# Patient Record
Sex: Male | Born: 1945 | Race: White | Hispanic: No | Marital: Married | Smoking: Never smoker
Health system: Southern US, Community
[De-identification: ages and names within clinical notes are randomized; demographics above are authoritative.]

## PROBLEM LIST (undated history)

## (undated) DIAGNOSIS — Z923 Personal history of irradiation: Secondary | ICD-10-CM

## (undated) DIAGNOSIS — C8307 Small cell B-cell lymphoma, spleen: Secondary | ICD-10-CM

## (undated) DIAGNOSIS — L570 Actinic keratosis: Secondary | ICD-10-CM

## (undated) DIAGNOSIS — C61 Malignant neoplasm of prostate: Secondary | ICD-10-CM

## (undated) DIAGNOSIS — I10 Essential (primary) hypertension: Secondary | ICD-10-CM

## (undated) DIAGNOSIS — D696 Thrombocytopenia, unspecified: Secondary | ICD-10-CM

## (undated) HISTORY — DX: Actinic keratosis: L57.0

## (undated) HISTORY — DX: Small cell b-cell lymphoma, spleen: C83.07

---

## 2000-12-25 ENCOUNTER — Other Ambulatory Visit: Admission: RE | Admit: 2000-12-25 | Discharge: 2000-12-25 | Payer: Self-pay | Admitting: Urology

## 2001-01-05 ENCOUNTER — Encounter: Admission: RE | Admit: 2001-01-05 | Discharge: 2001-01-05 | Payer: Self-pay | Admitting: Urology

## 2001-01-05 ENCOUNTER — Encounter: Payer: Self-pay | Admitting: Urology

## 2001-01-14 ENCOUNTER — Ambulatory Visit: Admission: RE | Admit: 2001-01-14 | Discharge: 2001-04-14 | Payer: Self-pay | Admitting: Radiation Oncology

## 2001-02-24 DIAGNOSIS — C61 Malignant neoplasm of prostate: Secondary | ICD-10-CM

## 2001-02-24 HISTORY — DX: Malignant neoplasm of prostate: C61

## 2001-03-02 ENCOUNTER — Inpatient Hospital Stay (HOSPITAL_COMMUNITY): Admission: RE | Admit: 2001-03-02 | Discharge: 2001-03-05 | Payer: Self-pay | Admitting: Urology

## 2001-03-02 HISTORY — PX: RETROPUBIC PROSTATECTOMY: SUR1055

## 2001-03-17 ENCOUNTER — Emergency Department (HOSPITAL_COMMUNITY): Admission: EM | Admit: 2001-03-17 | Discharge: 2001-03-17 | Payer: Self-pay | Admitting: Emergency Medicine

## 2001-03-31 ENCOUNTER — Ambulatory Visit (HOSPITAL_COMMUNITY): Admission: RE | Admit: 2001-03-31 | Discharge: 2001-03-31 | Payer: Self-pay | Admitting: *Deleted

## 2001-04-01 ENCOUNTER — Ambulatory Visit (HOSPITAL_COMMUNITY): Admission: RE | Admit: 2001-04-01 | Discharge: 2001-04-01 | Payer: Self-pay | Admitting: *Deleted

## 2001-04-01 ENCOUNTER — Encounter: Payer: Self-pay | Admitting: *Deleted

## 2003-05-24 ENCOUNTER — Encounter: Admission: RE | Admit: 2003-05-24 | Discharge: 2003-05-24 | Payer: Self-pay | Admitting: Family Medicine

## 2003-07-12 ENCOUNTER — Encounter: Admission: RE | Admit: 2003-07-12 | Discharge: 2003-07-12 | Payer: Self-pay | Admitting: Neurosurgery

## 2003-07-27 ENCOUNTER — Encounter: Admission: RE | Admit: 2003-07-27 | Discharge: 2003-07-27 | Payer: Self-pay | Admitting: Neurosurgery

## 2003-12-25 ENCOUNTER — Encounter: Admission: RE | Admit: 2003-12-25 | Discharge: 2003-12-25 | Payer: Self-pay | Admitting: Family Medicine

## 2010-03-13 ENCOUNTER — Other Ambulatory Visit: Payer: Self-pay | Admitting: Gastroenterology

## 2010-11-12 ENCOUNTER — Ambulatory Visit: Payer: Self-pay | Admitting: Oncology

## 2010-11-25 ENCOUNTER — Ambulatory Visit: Payer: Self-pay | Admitting: Oncology

## 2010-12-26 ENCOUNTER — Ambulatory Visit: Payer: Self-pay | Admitting: Oncology

## 2011-02-26 ENCOUNTER — Ambulatory Visit: Payer: Self-pay | Admitting: Oncology

## 2011-02-26 LAB — HEPATIC FUNCTION PANEL A (ARMC)
Albumin: 4.2 g/dL (ref 3.4–5.0)
Alkaline Phosphatase: 99 U/L (ref 50–136)
Bilirubin, Direct: 0.1 mg/dL (ref 0.00–0.20)
Bilirubin,Total: 0.6 mg/dL (ref 0.2–1.0)
Total Protein: 6.3 g/dL — ABNORMAL LOW (ref 6.4–8.2)

## 2011-02-26 LAB — CBC CANCER CENTER
Basophil %: 0.5 %
Eosinophil #: 0 x10 3/mm (ref 0.0–0.7)
HCT: 38.6 % — ABNORMAL LOW (ref 40.0–52.0)
HGB: 13.2 g/dL (ref 13.0–18.0)
MCH: 26.6 pg (ref 26.0–34.0)
MCHC: 34.1 g/dL (ref 32.0–36.0)
Monocyte #: 0.3 x10 3/mm (ref 0.0–0.7)
Neutrophil #: 2.4 x10 3/mm (ref 1.4–6.5)
Neutrophil %: 72.9 %

## 2011-03-28 ENCOUNTER — Ambulatory Visit: Payer: Self-pay | Admitting: Oncology

## 2011-04-07 DIAGNOSIS — N529 Male erectile dysfunction, unspecified: Secondary | ICD-10-CM | POA: Insufficient documentation

## 2011-04-07 DIAGNOSIS — C61 Malignant neoplasm of prostate: Secondary | ICD-10-CM | POA: Insufficient documentation

## 2011-09-03 ENCOUNTER — Ambulatory Visit: Payer: Self-pay | Admitting: Oncology

## 2011-09-03 LAB — COMPREHENSIVE METABOLIC PANEL
Albumin: 4.2 g/dL (ref 3.4–5.0)
Anion Gap: 5 — ABNORMAL LOW (ref 7–16)
Calcium, Total: 8.8 mg/dL (ref 8.5–10.1)
EGFR (African American): >60
Glucose: 112 mg/dL — ABNORMAL HIGH (ref 65–99)
Potassium: 4.3 mmol/L (ref 3.5–5.1)
SGOT(AST): 22 U/L (ref 15–37)

## 2011-09-03 LAB — CBC CANCER CENTER
Basophil %: 0.6 %
Eosinophil #: 0 x10 3/mm (ref 0.0–0.7)
HGB: 12.9 g/dL — ABNORMAL LOW (ref 13.0–18.0)
Lymphocyte %: 16.5 %
MCHC: 32.2 g/dL (ref 32.0–36.0)
Neutrophil %: 74.6 %
Platelet: 48 x10 3/mm — ABNORMAL LOW (ref 150–440)
RBC: 5.19 10*6/uL (ref 4.40–5.90)

## 2011-09-25 ENCOUNTER — Ambulatory Visit: Payer: Self-pay | Admitting: Oncology

## 2011-09-29 DIAGNOSIS — R972 Elevated prostate specific antigen [PSA]: Secondary | ICD-10-CM | POA: Insufficient documentation

## 2011-11-24 ENCOUNTER — Ambulatory Visit: Payer: Self-pay | Admitting: Oncology

## 2011-11-26 ENCOUNTER — Ambulatory Visit: Payer: Self-pay | Admitting: Oncology

## 2011-11-26 LAB — CBC CANCER CENTER
Basophil #: 0 x10 3/mm (ref 0.0–0.1)
Eosinophil #: 0 x10 3/mm (ref 0.0–0.7)
HGB: 12.4 g/dL — ABNORMAL LOW (ref 13.0–18.0)
Lymphocyte %: 16.3 %
MCH: 25 pg — ABNORMAL LOW (ref 26.0–34.0)
MCHC: 32.2 g/dL (ref 32.0–36.0)
Monocyte #: 0.3 x10 3/mm (ref 0.2–1.0)
RDW: 15.6 % — ABNORMAL HIGH (ref 11.5–14.5)

## 2011-12-26 ENCOUNTER — Ambulatory Visit: Payer: Self-pay | Admitting: Oncology

## 2012-02-23 ENCOUNTER — Ambulatory Visit: Payer: Self-pay | Admitting: Oncology

## 2012-03-01 ENCOUNTER — Ambulatory Visit: Payer: Self-pay | Admitting: Oncology

## 2012-03-04 ENCOUNTER — Ambulatory Visit: Payer: Self-pay | Admitting: Oncology

## 2012-03-04 LAB — COMPREHENSIVE METABOLIC PANEL
Alkaline Phosphatase: 128 U/L (ref 50–136)
Anion Gap: 8 (ref 7–16)
Bilirubin,Total: 0.5 mg/dL (ref 0.2–1.0)
Calcium, Total: 8.6 mg/dL (ref 8.5–10.1)
Chloride: 109 mmol/L — ABNORMAL HIGH (ref 98–107)
Creatinine: 1.28 mg/dL (ref 0.60–1.30)
EGFR (African American): >60
EGFR (Non-African Amer.): 58 — ABNORMAL LOW
Glucose: 107 mg/dL — ABNORMAL HIGH (ref 65–99)
Potassium: 4.4 mmol/L (ref 3.5–5.1)
SGOT(AST): 22 U/L (ref 15–37)
SGPT (ALT): 37 U/L (ref 12–78)
Total Protein: 6.1 g/dL — ABNORMAL LOW (ref 6.4–8.2)

## 2012-03-04 LAB — CBC CANCER CENTER
Basophil #: 0 x10 3/mm (ref 0.0–0.1)
Basophil %: 0.7 %
Eosinophil %: 1.3 %
HCT: 36.1 % — ABNORMAL LOW (ref 40.0–52.0)
HGB: 12.2 g/dL — ABNORMAL LOW (ref 13.0–18.0)
Lymphocyte #: 0.6 x10 3/mm — ABNORMAL LOW (ref 1.0–3.6)
Lymphocyte %: 17.4 %
MCHC: 33.8 g/dL (ref 32.0–36.0)
MCV: 76 fL — ABNORMAL LOW (ref 80–100)
Monocyte #: 0.3 x10 3/mm (ref 0.2–1.0)
Platelet: 44 x10 3/mm — ABNORMAL LOW (ref 150–440)
RBC: 4.76 10*6/uL (ref 4.40–5.90)
RDW: 15.5 % — ABNORMAL HIGH (ref 11.5–14.5)

## 2012-03-27 ENCOUNTER — Ambulatory Visit: Payer: Self-pay | Admitting: Oncology

## 2012-04-24 HISTORY — PX: SPLENECTOMY: SUR1306

## 2012-05-28 ENCOUNTER — Ambulatory Visit: Payer: Self-pay | Admitting: Oncology

## 2012-05-28 LAB — CBC CANCER CENTER
Basophil #: 0.1 x10 3/mm (ref 0.0–0.1)
Basophil %: 0.7 %
Eosinophil #: 0.3 x10 3/mm (ref 0.0–0.7)
Eosinophil %: 1.5 %
HCT: 33.8 % — ABNORMAL LOW (ref 40.0–52.0)
MCH: 25.4 pg — ABNORMAL LOW (ref 26.0–34.0)
MCHC: 31.4 g/dL — ABNORMAL LOW (ref 32.0–36.0)
MCV: 81 fL (ref 80–100)
Platelet: 1598 x10 3/mm — ABNORMAL HIGH (ref 150–440)
RBC: 4.18 10*6/uL — ABNORMAL LOW (ref 4.40–5.90)
RDW: 17.2 % — ABNORMAL HIGH (ref 11.5–14.5)
WBC: 20.2 x10 3/mm — ABNORMAL HIGH (ref 3.8–10.6)

## 2012-06-01 LAB — CBC CANCER CENTER
Basophil %: 0.3 %
Eosinophil #: 0.3 x10 3/mm (ref 0.0–0.7)
Lymphocyte #: 2.3 x10 3/mm (ref 1.0–3.6)
Lymphocyte %: 17.4 %
MCHC: 32.2 g/dL (ref 32.0–36.0)
MCV: 82 fL (ref 80–100)
Monocyte %: 11.9 %
Neutrophil #: 9.2 x10 3/mm — ABNORMAL HIGH (ref 1.4–6.5)

## 2012-06-08 LAB — CBC CANCER CENTER
HCT: 36.7 % — ABNORMAL LOW (ref 40.0–52.0)
MCHC: 31.8 g/dL — ABNORMAL LOW (ref 32.0–36.0)
Monocytes: 15 %
WBC: 18.9 x10 3/mm — ABNORMAL HIGH (ref 3.8–10.6)

## 2012-06-15 LAB — CBC CANCER CENTER
Monocytes: 11 %
Platelet: 364 x10 3/mm (ref 150–440)
Segmented Neutrophils: 58 %
WBC: 21.5 x10 3/mm — ABNORMAL HIGH (ref 3.8–10.6)

## 2012-06-15 LAB — COMPREHENSIVE METABOLIC PANEL
Alkaline Phosphatase: 190 U/L — ABNORMAL HIGH (ref 50–136)
BUN: 10 mg/dL (ref 7–18)
Calcium, Total: 9.6 mg/dL (ref 8.5–10.1)
EGFR (African American): >60
Glucose: 115 mg/dL — ABNORMAL HIGH (ref 65–99)
Osmolality: 279 (ref 275–301)
SGOT(AST): 21 U/L (ref 15–37)
SGPT (ALT): 41 U/L (ref 12–78)
Sodium: 140 mmol/L (ref 136–145)
Total Protein: 6.8 g/dL (ref 6.4–8.2)

## 2012-06-23 LAB — CBC CANCER CENTER
HGB: 10.6 g/dL — ABNORMAL LOW (ref 13.0–18.0)
MCHC: 30.8 g/dL — ABNORMAL LOW (ref 32.0–36.0)
MCV: 83 fL (ref 80–100)
Segmented Neutrophils: 72 %
WBC: 29.4 x10 3/mm — ABNORMAL HIGH (ref 3.8–10.6)

## 2012-06-24 ENCOUNTER — Ambulatory Visit: Payer: Self-pay | Admitting: Oncology

## 2012-06-28 LAB — CBC CANCER CENTER
Eosinophil: 1 %
HCT: 38.2 % — ABNORMAL LOW (ref 40.0–52.0)
HGB: 11.8 g/dL — ABNORMAL LOW (ref 13.0–18.0)
Lymphocytes: 28 %
MCH: 25.9 pg — ABNORMAL LOW (ref 26.0–34.0)
MCV: 84 fL (ref 80–100)
Monocytes: 13 %

## 2012-06-28 LAB — URINALYSIS, COMPLETE
Blood: NEGATIVE
Glucose,UR: NEGATIVE mg/dL (ref 0–75)
Nitrite: NEGATIVE
RBC,UR: <1 /HPF (ref 0–5)
Specific Gravity: 1.015 (ref 1.003–1.030)

## 2012-06-29 LAB — URINE CULTURE

## 2012-07-07 LAB — CBC CANCER CENTER
Bands: 1 %
Basophil: 1 %
Lymphocytes: 38 %
Monocytes: 13 %
RDW: 21 % — ABNORMAL HIGH (ref 11.5–14.5)
Segmented Neutrophils: 47 %
WBC: 17.4 x10 3/mm — ABNORMAL HIGH (ref 3.8–10.6)

## 2012-07-14 LAB — CBC CANCER CENTER
Basophil %: 1.7 %
Eosinophil %: 2.2 %
HCT: 37.1 % — ABNORMAL LOW (ref 40.0–52.0)
Lymphocyte #: 6.6 x10 3/mm — ABNORMAL HIGH (ref 1.0–3.6)
Lymphocyte %: 43.5 %
MCV: 85 fL (ref 80–100)
Monocyte %: 11 %
Neutrophil #: 6.3 x10 3/mm (ref 1.4–6.5)
Neutrophil %: 41.6 %
Platelet: 134 x10 3/mm — ABNORMAL LOW (ref 150–440)

## 2012-07-25 ENCOUNTER — Ambulatory Visit: Payer: Self-pay | Admitting: Oncology

## 2012-07-28 LAB — COMPREHENSIVE METABOLIC PANEL
Alkaline Phosphatase: 149 U/L — ABNORMAL HIGH (ref 50–136)
BUN: 17 mg/dL (ref 7–18)
Bilirubin,Total: 0.2 mg/dL (ref 0.2–1.0)
Co2: 29 mmol/L (ref 21–32)
EGFR (African American): >60
EGFR (Non-African Amer.): >60
Osmolality: 289 (ref 275–301)
Potassium: 4.7 mmol/L (ref 3.5–5.1)
SGOT(AST): 29 U/L (ref 15–37)
SGPT (ALT): 41 U/L (ref 12–78)
Total Protein: 6.7 g/dL (ref 6.4–8.2)

## 2012-07-28 LAB — CBC CANCER CENTER
Basophil #: 0.2 x10 3/mm — ABNORMAL HIGH (ref 0.0–0.1)
Basophil %: 1.1 %
Eosinophil #: 0.2 x10 3/mm (ref 0.0–0.7)
HCT: 39.2 % — ABNORMAL LOW (ref 40.0–52.0)
HGB: 12.5 g/dL — ABNORMAL LOW (ref 13.0–18.0)
Lymphocyte %: 36.4 %
MCHC: 31.9 g/dL — ABNORMAL LOW (ref 32.0–36.0)
Monocyte #: 1.4 x10 3/mm — ABNORMAL HIGH (ref 0.2–1.0)
Monocyte %: 9.8 %
Platelet: 486 x10 3/mm — ABNORMAL HIGH (ref 150–440)
RBC: 4.62 10*6/uL (ref 4.40–5.90)
WBC: 14.3 x10 3/mm — ABNORMAL HIGH (ref 3.8–10.6)

## 2012-08-24 ENCOUNTER — Ambulatory Visit: Payer: Self-pay | Admitting: Oncology

## 2012-08-25 LAB — CBC CANCER CENTER
Eosinophil %: 2.2 %
HCT: 40 % (ref 40.0–52.0)
Lymphocyte #: 5.2 x10 3/mm — ABNORMAL HIGH (ref 1.0–3.6)
Lymphocyte %: 49.6 %
MCH: 26.7 pg (ref 26.0–34.0)
Monocyte #: 1.2 x10 3/mm — ABNORMAL HIGH (ref 0.2–1.0)
Neutrophil %: 32.3 %
RBC: 4.9 10*6/uL (ref 4.40–5.90)

## 2012-09-07 LAB — CBC CANCER CENTER
Basophil #: 0.2 x10 3/mm — ABNORMAL HIGH (ref 0.0–0.1)
Basophil %: 1.4 %
Eosinophil #: 0.2 x10 3/mm (ref 0.0–0.7)
Eosinophil %: 1.6 %
HCT: 42.2 % (ref 40.0–52.0)
HGB: 13.5 g/dL (ref 13.0–18.0)
Lymphocyte %: 47.6 %
MCH: 26.2 pg (ref 26.0–34.0)
Monocyte %: 10.9 %
RDW: 15.6 % — ABNORMAL HIGH (ref 11.5–14.5)

## 2012-09-24 ENCOUNTER — Ambulatory Visit: Payer: Self-pay | Admitting: Oncology

## 2012-09-29 LAB — CBC CANCER CENTER
Basophil #: 0.2 x10 3/mm — ABNORMAL HIGH (ref 0.0–0.1)
Basophil %: 1.5 %
Eosinophil %: 1.2 %
Lymphocyte %: 40.6 %
Monocyte %: 9.8 %
Neutrophil #: 5.6 x10 3/mm (ref 1.4–6.5)
Neutrophil %: 46.9 %
Platelet: 313 x10 3/mm (ref 150–440)
RDW: 16.5 % — ABNORMAL HIGH (ref 11.5–14.5)

## 2012-10-25 ENCOUNTER — Ambulatory Visit: Payer: Self-pay | Admitting: Oncology

## 2012-10-27 LAB — CBC CANCER CENTER
Basophil #: 0.2 x10 3/mm — ABNORMAL HIGH (ref 0.0–0.1)
Eosinophil #: 0.2 x10 3/mm (ref 0.0–0.7)
Eosinophil %: 1.3 %
Lymphocyte %: 38.1 %
MCH: 26.3 pg (ref 26.0–34.0)
MCHC: 32.2 g/dL (ref 32.0–36.0)
MCV: 82 fL (ref 80–100)
Monocyte %: 8.1 %
Neutrophil #: 7.3 x10 3/mm — ABNORMAL HIGH (ref 1.4–6.5)
RBC: 5.61 10*6/uL (ref 4.40–5.90)
WBC: 14.3 x10 3/mm — ABNORMAL HIGH (ref 3.8–10.6)

## 2012-10-27 LAB — COMPREHENSIVE METABOLIC PANEL
Albumin: 4.1 g/dL (ref 3.4–5.0)
BUN: 15 mg/dL (ref 7–18)
Bilirubin,Total: 0.6 mg/dL (ref 0.2–1.0)
Calcium, Total: 8.9 mg/dL (ref 8.5–10.1)
Co2: 28 mmol/L (ref 21–32)
Glucose: 111 mg/dL — ABNORMAL HIGH (ref 65–99)
Potassium: 4.4 mmol/L (ref 3.5–5.1)
SGOT(AST): 25 U/L (ref 15–37)
SGPT (ALT): 36 U/L (ref 12–78)
Total Protein: 6.7 g/dL (ref 6.4–8.2)

## 2012-11-24 ENCOUNTER — Ambulatory Visit: Payer: Self-pay | Admitting: Oncology

## 2012-11-24 LAB — COMPREHENSIVE METABOLIC PANEL
Albumin: 4.1 g/dL (ref 3.4–5.0)
Alkaline Phosphatase: 191 U/L — ABNORMAL HIGH (ref 50–136)
Anion Gap: 10 (ref 7–16)
Bilirubin,Total: 0.4 mg/dL (ref 0.2–1.0)
Calcium, Total: 8.9 mg/dL (ref 8.5–10.1)
Chloride: 105 mmol/L (ref 98–107)
EGFR (African American): >60
Osmolality: 285 (ref 275–301)
Potassium: 4.5 mmol/L (ref 3.5–5.1)
SGPT (ALT): 54 U/L (ref 12–78)
Total Protein: 6.9 g/dL (ref 6.4–8.2)

## 2012-11-24 LAB — CBC CANCER CENTER
Basophil %: 1.2 %
Eosinophil %: 1.5 %
HCT: 48.1 % (ref 40.0–52.0)
Lymphocyte %: 27.6 %
MCHC: 32.3 g/dL (ref 32.0–36.0)
Neutrophil %: 58.3 %
RBC: 5.87 10*6/uL (ref 4.40–5.90)
WBC: 13.7 x10 3/mm — ABNORMAL HIGH (ref 3.8–10.6)

## 2012-12-22 LAB — CBC CANCER CENTER
Basophil #: 0.1 x10 3/mm (ref 0.0–0.1)
Eosinophil #: 0.2 x10 3/mm (ref 0.0–0.7)
Eosinophil %: 1.4 %
Lymphocyte #: 3.6 x10 3/mm (ref 1.0–3.6)
MCHC: 32.3 g/dL (ref 32.0–36.0)
Monocyte %: 11.9 %
Neutrophil #: 7.9 x10 3/mm — ABNORMAL HIGH (ref 1.4–6.5)
Platelet: 319 x10 3/mm (ref 150–440)
RBC: 5.65 10*6/uL (ref 4.40–5.90)
RDW: 18.5 % — ABNORMAL HIGH (ref 11.5–14.5)
WBC: 13.5 x10 3/mm — ABNORMAL HIGH (ref 3.8–10.6)

## 2012-12-25 ENCOUNTER — Ambulatory Visit: Payer: Self-pay | Admitting: Oncology

## 2013-03-16 ENCOUNTER — Ambulatory Visit: Payer: Self-pay | Admitting: Oncology

## 2013-03-16 LAB — CBC CANCER CENTER
Basophil #: 0.2 x10 3/mm — ABNORMAL HIGH (ref 0.0–0.1)
Basophil %: 1.1 %
EOS PCT: 1.9 %
Eosinophil #: 0.3 x10 3/mm (ref 0.0–0.7)
HCT: 50.6 % (ref 40.0–52.0)
HGB: 16.3 g/dL (ref 13.0–18.0)
LYMPHS PCT: 31.8 %
Lymphocyte #: 4.3 x10 3/mm — ABNORMAL HIGH (ref 1.0–3.6)
MCH: 28.2 pg (ref 26.0–34.0)
MCHC: 32.2 g/dL (ref 32.0–36.0)
MCV: 87 fL (ref 80–100)
Monocyte #: 1.4 x10 3/mm — ABNORMAL HIGH (ref 0.2–1.0)
Monocyte %: 10.4 %
Neutrophil #: 7.5 x10 3/mm — ABNORMAL HIGH (ref 1.4–6.5)
Neutrophil %: 54.8 %
Platelet: 333 x10 3/mm (ref 150–440)
RBC: 5.79 10*6/uL (ref 4.40–5.90)
RDW: 15.3 % — ABNORMAL HIGH (ref 11.5–14.5)
WBC: 13.6 x10 3/mm — AB (ref 3.8–10.6)

## 2013-03-16 LAB — COMPREHENSIVE METABOLIC PANEL
ALK PHOS: 123 U/L — AB
AST: 26 U/L (ref 15–37)
Albumin: 4.2 g/dL (ref 3.4–5.0)
Anion Gap: 8 (ref 7–16)
BILIRUBIN TOTAL: 0.5 mg/dL (ref 0.2–1.0)
BUN: 18 mg/dL (ref 7–18)
CALCIUM: 8.6 mg/dL (ref 8.5–10.1)
CO2: 28 mmol/L (ref 21–32)
Chloride: 105 mmol/L (ref 98–107)
Creatinine: 1.21 mg/dL (ref 0.60–1.30)
EGFR (African American): >60
EGFR (Non-African Amer.): >60
GLUCOSE: 119 mg/dL — AB (ref 65–99)
Osmolality: 284 (ref 275–301)
Potassium: 4.5 mmol/L (ref 3.5–5.1)
SGPT (ALT): 38 U/L (ref 12–78)
Sodium: 141 mmol/L (ref 136–145)
Total Protein: 6.9 g/dL (ref 6.4–8.2)

## 2013-03-27 ENCOUNTER — Ambulatory Visit: Payer: Self-pay | Admitting: Oncology

## 2013-04-13 LAB — CBC CANCER CENTER
BASOS ABS: 0.2 x10 3/mm — AB (ref 0.0–0.1)
BASOS PCT: 1.2 %
EOS PCT: 1.3 %
Eosinophil #: 0.2 x10 3/mm (ref 0.0–0.7)
HCT: 50 % (ref 40.0–52.0)
HGB: 16.9 g/dL (ref 13.0–18.0)
Lymphocyte #: 4.8 x10 3/mm — ABNORMAL HIGH (ref 1.0–3.6)
Lymphocyte %: 36.4 %
MCH: 30 pg (ref 26.0–34.0)
MCHC: 33.8 g/dL (ref 32.0–36.0)
MCV: 89 fL (ref 80–100)
MONOS PCT: 9.3 %
Monocyte #: 1.2 x10 3/mm — ABNORMAL HIGH (ref 0.2–1.0)
NEUTROS ABS: 6.8 x10 3/mm — AB (ref 1.4–6.5)
Neutrophil %: 51.8 %
PLATELETS: 331 x10 3/mm (ref 150–440)
RBC: 5.64 10*6/uL (ref 4.40–5.90)
RDW: 15.4 % — ABNORMAL HIGH (ref 11.5–14.5)
WBC: 13.2 x10 3/mm — AB (ref 3.8–10.6)

## 2013-04-24 ENCOUNTER — Ambulatory Visit: Payer: Self-pay | Admitting: Oncology

## 2013-05-11 LAB — COMPREHENSIVE METABOLIC PANEL
AST: 24 U/L (ref 15–37)
Albumin: 4.1 g/dL (ref 3.4–5.0)
Alkaline Phosphatase: 114 U/L
Anion Gap: 8 (ref 7–16)
BUN: 28 mg/dL — ABNORMAL HIGH (ref 7–18)
Bilirubin,Total: 0.3 mg/dL (ref 0.2–1.0)
Calcium, Total: 9.1 mg/dL (ref 8.5–10.1)
Chloride: 107 mmol/L (ref 98–107)
Co2: 29 mmol/L (ref 21–32)
Creatinine: 1.13 mg/dL (ref 0.60–1.30)
EGFR (African American): >60
EGFR (Non-African Amer.): >60
Glucose: 107 mg/dL — ABNORMAL HIGH (ref 65–99)
OSMOLALITY: 293 (ref 275–301)
Potassium: 4.3 mmol/L (ref 3.5–5.1)
SGPT (ALT): 41 U/L (ref 12–78)
Sodium: 144 mmol/L (ref 136–145)
TOTAL PROTEIN: 6.6 g/dL (ref 6.4–8.2)

## 2013-05-11 LAB — CBC CANCER CENTER
Basophil #: 0.2 x10 3/mm — ABNORMAL HIGH (ref 0.0–0.1)
Basophil %: 1.1 %
EOS ABS: 0.2 x10 3/mm (ref 0.0–0.7)
EOS PCT: 1.5 %
HCT: 50.4 % (ref 40.0–52.0)
HGB: 16.4 g/dL (ref 13.0–18.0)
LYMPHS PCT: 37.2 %
Lymphocyte #: 5.4 x10 3/mm — ABNORMAL HIGH (ref 1.0–3.6)
MCH: 29 pg (ref 26.0–34.0)
MCHC: 32.4 g/dL (ref 32.0–36.0)
MCV: 89 fL (ref 80–100)
Monocyte #: 1.3 x10 3/mm — ABNORMAL HIGH (ref 0.2–1.0)
Monocyte %: 8.7 %
Neutrophil #: 7.5 x10 3/mm — ABNORMAL HIGH (ref 1.4–6.5)
Neutrophil %: 51.5 %
PLATELETS: 295 x10 3/mm (ref 150–440)
RBC: 5.65 10*6/uL (ref 4.40–5.90)
RDW: 15.3 % — ABNORMAL HIGH (ref 11.5–14.5)
WBC: 14.5 x10 3/mm — ABNORMAL HIGH (ref 3.8–10.6)

## 2013-05-25 ENCOUNTER — Ambulatory Visit: Payer: Self-pay | Admitting: Oncology

## 2013-07-11 ENCOUNTER — Ambulatory Visit: Payer: Self-pay | Admitting: Oncology

## 2013-07-13 ENCOUNTER — Ambulatory Visit: Payer: Self-pay | Admitting: Oncology

## 2013-07-13 LAB — COMPREHENSIVE METABOLIC PANEL
ALBUMIN: 4.4 g/dL (ref 3.4–5.0)
ALK PHOS: 115 U/L
ANION GAP: 8 (ref 7–16)
AST: 31 U/L (ref 15–37)
BILIRUBIN TOTAL: 0.6 mg/dL (ref 0.2–1.0)
BUN: 22 mg/dL — ABNORMAL HIGH (ref 7–18)
CALCIUM: 9 mg/dL (ref 8.5–10.1)
CHLORIDE: 105 mmol/L (ref 98–107)
CO2: 29 mmol/L (ref 21–32)
CREATININE: 1.07 mg/dL (ref 0.60–1.30)
EGFR (African American): >60
Glucose: 111 mg/dL — ABNORMAL HIGH (ref 65–99)
Osmolality: 287 (ref 275–301)
POTASSIUM: 4.6 mmol/L (ref 3.5–5.1)
SGPT (ALT): 52 U/L (ref 12–78)
SODIUM: 142 mmol/L (ref 136–145)
Total Protein: 7.2 g/dL (ref 6.4–8.2)

## 2013-07-13 LAB — CBC CANCER CENTER
Basophil #: 0.1 x10 3/mm (ref 0.0–0.1)
Basophil %: 0.9 %
EOS ABS: 0.2 x10 3/mm (ref 0.0–0.7)
EOS PCT: 1.3 %
HCT: 49.3 % (ref 40.0–52.0)
HGB: 16.8 g/dL (ref 13.0–18.0)
Lymphocyte #: 4.9 x10 3/mm — ABNORMAL HIGH (ref 1.0–3.6)
Lymphocyte %: 35.5 %
MCH: 30.2 pg (ref 26.0–34.0)
MCHC: 34.1 g/dL (ref 32.0–36.0)
MCV: 89 fL (ref 80–100)
Monocyte #: 1.3 x10 3/mm — ABNORMAL HIGH (ref 0.2–1.0)
Monocyte %: 9.7 %
NEUTROS PCT: 52.6 %
Neutrophil #: 7.3 x10 3/mm — ABNORMAL HIGH (ref 1.4–6.5)
PLATELETS: 287 x10 3/mm (ref 150–440)
RBC: 5.57 10*6/uL (ref 4.40–5.90)
RDW: 14.6 % — ABNORMAL HIGH (ref 11.5–14.5)
WBC: 13.9 x10 3/mm — AB (ref 3.8–10.6)

## 2013-07-25 ENCOUNTER — Ambulatory Visit: Payer: Self-pay | Admitting: Oncology

## 2013-10-18 ENCOUNTER — Encounter: Payer: Self-pay | Admitting: Radiation Oncology

## 2013-10-18 NOTE — Progress Notes (Signed)
GU Location of Tumor / Histology: prostate adenocarcinoma, s/p radical prostatectomy w/positive margins 2003  If Prostate Cancer, Gleason Score is (3 + 4) and PSA is (0.37 on 04/04/13)  PSA,Diagnostic Assay - Final result (10/03/2013 8:04 PM EDT)  Component Value   PROSTATE SPECIFIC AG 0.54    Haskel Khan presented  1 month ago with signs/symptoms of: slowly increasing PSA, s/p prostatectomy in 2003  Biopsies of  (if applicable) revealed: adenocarcinoma Gleason 3+4=7 03/02/2001   Past/Anticipated interventions by urology, if any: PSA surveillance  Past/Anticipated interventions by medical oncology, if any: none  Weight changes, if any: no  Bowel/Bladder complaints, if any:  IPSS  9, noct x 2, urgency, frequency depending on what fluids he drinks  Nausea/Vomiting, if any: no  Pain issues, if any:  no  SAFETY ISSUES:  Prior radiation? no  Pacemaker/ICD? no  Possible current pregnancy? na  Is the patient on methotrexate? no  Current Complaints / other details:  Married, 2 daughters, 3 grandchildren, 1 on the way, retired from [employer], works part time in [employer] at [employer]

## 2013-10-19 ENCOUNTER — Encounter: Payer: Self-pay | Admitting: Radiation Oncology

## 2013-10-20 ENCOUNTER — Encounter: Payer: Self-pay | Admitting: Radiation Oncology

## 2013-10-20 ENCOUNTER — Ambulatory Visit
Admission: RE | Admit: 2013-10-20 | Discharge: 2013-10-20 | Disposition: A | Discharge status: Home / Self Care | Payer: PRIVATE HEALTH INSURANCE | Source: Ambulatory Visit | Attending: Radiation Oncology | Admitting: Radiation Oncology

## 2013-10-20 VITALS — BP 147/89 | HR 79 | Temp 98.0°F | Resp 20 | Ht 69.0 in | Wt 220.0 lb

## 2013-10-20 DIAGNOSIS — Z510 Encounter for antineoplastic radiation therapy: Secondary | ICD-10-CM | POA: Diagnosis not present

## 2013-10-20 DIAGNOSIS — C61 Malignant neoplasm of prostate: Secondary | ICD-10-CM | POA: Diagnosis not present

## 2013-10-20 DIAGNOSIS — Z8042 Family history of malignant neoplasm of prostate: Secondary | ICD-10-CM | POA: Insufficient documentation

## 2013-10-20 DIAGNOSIS — Z7982 Long term (current) use of aspirin: Secondary | ICD-10-CM | POA: Insufficient documentation

## 2013-10-20 DIAGNOSIS — Z9079 Acquired absence of other genital organ(s): Secondary | ICD-10-CM | POA: Diagnosis not present

## 2013-10-20 DIAGNOSIS — Z9089 Acquired absence of other organs: Secondary | ICD-10-CM | POA: Insufficient documentation

## 2013-10-20 DIAGNOSIS — Z8546 Personal history of malignant neoplasm of prostate: Secondary | ICD-10-CM | POA: Insufficient documentation

## 2013-10-20 DIAGNOSIS — N529 Male erectile dysfunction, unspecified: Secondary | ICD-10-CM | POA: Insufficient documentation

## 2013-10-20 HISTORY — DX: Malignant neoplasm of prostate: C61

## 2013-10-20 HISTORY — DX: Thrombocytopenia, unspecified: D69.6

## 2013-10-20 NOTE — Progress Notes (Signed)
Per Dr Valere Dross, spoke w/[facility] secretary, [phone]. Pt resides in rm [address], nurse is April Roberts, Therapist, sports. Fax for [facility] [phone]. Dr Valere Dross

## 2013-10-20 NOTE — Progress Notes (Signed)
[facility] NEW PATIENT EVALUATION  Name: Chad Sandoval MRN: [id number]  Date:   10/20/2013           DOB: June 03, 1945  Status: outpatient   CC: No primary provider on file.  Chad Broker, MD    REFERRING PHYSICIAN: Myrlene Broker, MD   DIAGNOSIS: PSA recurrent carcinoma the prostate   HISTORY OF PRESENT ILLNESS:  Chad Sandoval is a 68 y.o. male who is seen today through the courtesy of Dr. 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  He presented with localized adenocarcinoma prostate and underwent a radical retropubic prostatectomy on 03/02/2001. He had Gleason 7 (3+4) disease with focal margin involvement. There appeared to be disease along the right posterior lateral prostate with a with transection of the capsule. 20% of the prostate tissue contained carcinoma and disease involved both lobes. Seminal vesicles were free of disease. 4 lymph nodes on the left and 2 lymph nodes on the right were free of metastatic disease. He did well postoperatively and his PSA was 0.2 in August 2011, 0.23 in February 2012, 0.31 in August 2012, 0.37 in November 2012, 0.32 in February 2013. More recent PSAs were 0.37 in August 2013, 0.35 in February 2014 , 0.4 in August 2014, 0.35 in October 2014, and most recently 0.54. He is doing well from a GU and GI standpoint. He does not have any significant urinary incontinence. He does have erectile dysfunction which is improved with Viagra.  PREVIOUS RADIATION THERAPY: No   PAST MEDICAL HISTORY:  has a past medical history of Prostate cancer (2003) and Thrombocytopenia.     PAST SURGICAL HISTORY:  Past Surgical History  Procedure Laterality Date  . Retropubic prostatectomy  03/02/2001    Gleason 7  . 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 Father ha history of prostate cancer.   SOCIAL HISTORY:  reports that he has never smoked. He does not have any smokeless tobacco history on file. He reports that he does not drink alcohol or use illicit drugs. Married, 2 children. 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 He currently works part time at [employer] and is an avid Air cabin crew.   ALLERGIES: Oxycontin   MEDICATIONS:  Current Outpatient Prescriptions  Medication Sig Dispense Refill  . aspirin 81 MG chewable tablet Chew 81 mg by mouth.      . sildenafil (VIAGRA) 100 MG tablet Take 100 mg by mouth.       No current facility-administered medications for this encounter.     REVIEW OF SYSTEMS:  Pertinent items are noted in HPI.    PHYSICAL EXAM:  height is 5\' 9"  (1.753 m) and weight is 220 lb (99.791 kg). His oral temperature is 98 F (36.7 C). His blood pressure is 147/89 and his pulse is 79. His respiration is 20.   Alert and oriented 68 year old white male appearing younger than his stated age appearing younger than his stated age.   LABORATORY DATA:  No results found for this basename: WBC, HGB, HCT, MCV, PLT   No results found for this basename: NA, K, CL, CO2   No results found for this basename: ALT, AST, GGT, ALKPHOS, BILITOT   PSA 0.54   IMPRESSION: Pathologic stageT3a/PSA recurrent carcinoma the prostate.  I explained to the patient and his wife that he has either local disease alone, distant disease alone, or both local and distant disease. Clinical predictors for local recurrence alone include a positive margin/pT3a disease, disease-free interval, slowly rising PSA (doubling time) and initial PSA of less than 10 and Gleason score less than or equal to 7. He has all of the predictors for a local recurrence alone. The most important predictor is a positive margin/extracapsular extension. He is an excellent candidate for salvage radiation therapy. We feel that the best "window of  opportunity" for salvage is between 0.2 -0.5. We discussed the potential acute and late toxicities of radiation therapy. From a technical standpoint, he will be treated with external beam/IMRT (Tomotherapy). We discussed treatment with a comfortably full bladder to minimize urinary toxicity. Consent is signed today. He will return for simulation/treatment planning next week.   PLAN: As discussed above.  I spent 40 minutes minutes face to face with the patient and more than 50% of that time was spent in counseling and/or coordination of care.

## 2013-10-20 NOTE — Progress Notes (Signed)
Please see the Nurse Progress Note in the MD Initial Consult Encounter for this patient. 

## 2013-10-24 ENCOUNTER — Ambulatory Visit
Admission: RE | Admit: 2013-10-24 | Discharge: 2013-10-24 | Disposition: A | Discharge status: Home / Self Care | Payer: PRIVATE HEALTH INSURANCE | Source: Ambulatory Visit | Attending: Radiation Oncology | Admitting: Radiation Oncology

## 2013-10-24 DIAGNOSIS — C61 Malignant neoplasm of prostate: Secondary | ICD-10-CM

## 2013-10-24 DIAGNOSIS — Z510 Encounter for antineoplastic radiation therapy: Secondary | ICD-10-CM | POA: Diagnosis not present

## 2013-10-24 NOTE — Progress Notes (Signed)
Complex simulation/treatment planning note:  The patient was taken to the CT simulator and placed supine. A Vac lock immobilization device was constructed. A red rubber tube was placed within the rectal vault. He was then catheterized for a urethrogram. He was then scanned. The CT data set was sent to the planning system (MIM) I contoured his high-risk prostate bed (CTV 66) and expanded this by 0.5 cm to create PTV 66 which will receive 6600 cGy in 33 sessions. Also contoured his rectum, bladder, and lower rectosigmoid colon. He is now ready for 3-D simulation.

## 2013-10-26 ENCOUNTER — Encounter: Payer: Self-pay | Admitting: Radiation Oncology

## 2013-10-26 DIAGNOSIS — Z510 Encounter for antineoplastic radiation therapy: Secondary | ICD-10-CM | POA: Diagnosis not present

## 2013-10-26 NOTE — Progress Notes (Addendum)
IMRT simulation/treatment planning note: The patient underwent IMRT simulation/treatment planning for treatment to his prostate bed. IMRT was chosen to decrease the risk for both acute and late bladder and rectal toxicity compared to conventional or 3-D conformal radiation therapy. Dose fine histograms were obtained for the target volume in addition to his bladder, rectum, and femoral heads. We met our departmental guidelines. He is set up to dual ARC VMAT IMRT. I prescribing 6600 cGy in 33 sessions utilizing 6 MV photons.

## 2013-11-01 DIAGNOSIS — Z510 Encounter for antineoplastic radiation therapy: Secondary | ICD-10-CM | POA: Diagnosis not present

## 2013-11-03 ENCOUNTER — Ambulatory Visit
Admission: RE | Admit: 2013-11-03 | Discharge: 2013-11-03 | Disposition: A | Discharge status: Home / Self Care | Payer: PRIVATE HEALTH INSURANCE | Source: Ambulatory Visit | Attending: Radiation Oncology | Admitting: Radiation Oncology

## 2013-11-03 ENCOUNTER — Ambulatory Visit: Payer: PRIVATE HEALTH INSURANCE

## 2013-11-03 DIAGNOSIS — C61 Malignant neoplasm of prostate: Secondary | ICD-10-CM

## 2013-11-03 DIAGNOSIS — Z510 Encounter for antineoplastic radiation therapy: Secondary | ICD-10-CM | POA: Diagnosis not present

## 2013-11-04 ENCOUNTER — Ambulatory Visit
Admission: RE | Admit: 2013-11-04 | Discharge: 2013-11-04 | Disposition: A | Discharge status: Home / Self Care | Payer: PRIVATE HEALTH INSURANCE | Source: Ambulatory Visit | Attending: Radiation Oncology | Admitting: Radiation Oncology

## 2013-11-04 ENCOUNTER — Ambulatory Visit: Payer: PRIVATE HEALTH INSURANCE

## 2013-11-04 DIAGNOSIS — Z510 Encounter for antineoplastic radiation therapy: Secondary | ICD-10-CM | POA: Diagnosis not present

## 2013-11-04 DIAGNOSIS — C61 Malignant neoplasm of prostate: Secondary | ICD-10-CM

## 2013-11-04 NOTE — Progress Notes (Signed)
Chart note: On 11/03/2013 the patient began his dual ARC VMAT IMRT in the management of his course of the prostate. He is being treated with dynamic MLCs corresponding to one set of IMRT treatment devices [phone]).

## 2013-11-04 NOTE — Progress Notes (Signed)
Patient education completed with pt. Gave pt "Radiation and You" booklet w/all pertinent information marked and discussed, re: rectal soreness/care, fatigue, urinary/bladder irritation/management, nutrition, pain. Pt verbalized understanding.

## 2013-11-07 ENCOUNTER — Ambulatory Visit
Admission: RE | Admit: 2013-11-07 | Discharge: 2013-11-07 | Disposition: A | Discharge status: Home / Self Care | Payer: PRIVATE HEALTH INSURANCE | Source: Ambulatory Visit | Attending: Radiation Oncology | Admitting: Radiation Oncology

## 2013-11-07 ENCOUNTER — Ambulatory Visit: Payer: PRIVATE HEALTH INSURANCE

## 2013-11-07 VITALS — BP 159/87 | HR 81 | Temp 98.1°F | Wt 219.8 lb

## 2013-11-07 DIAGNOSIS — C61 Malignant neoplasm of prostate: Secondary | ICD-10-CM

## 2013-11-07 DIAGNOSIS — Z510 Encounter for antineoplastic radiation therapy: Secondary | ICD-10-CM | POA: Diagnosis not present

## 2013-11-07 NOTE — Progress Notes (Signed)
Weekly Management Note:  Site: Prostate bed Current Dose:  600  cGy Projected Dose: 6600  cGy  Narrative: The patient is seen today for routine under treatment assessment. CBCT/MVCT images/port films were reviewed. The chart was reviewed.   Bladder filling is satisfactory but could be better. No GU or GI difficulties.  Physical Examination:  Filed Vitals:   11/07/13 1628  BP: 159/87  Pulse: 81  Temp: 98.1 F (36.7 C)  .  Weight: 219 lb 12.8 oz (99.701 kg). No change.  Impression: Tolerating radiation therapy well. I encouraged him to improve his bladder filling.  Plan: Continue radiation therapy as planned.

## 2013-11-07 NOTE — Progress Notes (Signed)
Weekly assessment of radiation to prostate fossa.Completed 3 treatments.No urinary of bowel problems voiced.

## 2013-11-08 ENCOUNTER — Ambulatory Visit: Payer: PRIVATE HEALTH INSURANCE

## 2013-11-08 ENCOUNTER — Ambulatory Visit
Admission: RE | Admit: 2013-11-08 | Discharge: 2013-11-08 | Disposition: A | Discharge status: Home / Self Care | Payer: PRIVATE HEALTH INSURANCE | Source: Ambulatory Visit | Attending: Radiation Oncology | Admitting: Radiation Oncology

## 2013-11-08 DIAGNOSIS — Z510 Encounter for antineoplastic radiation therapy: Secondary | ICD-10-CM | POA: Diagnosis not present

## 2013-11-09 ENCOUNTER — Ambulatory Visit: Payer: PRIVATE HEALTH INSURANCE

## 2013-11-09 ENCOUNTER — Ambulatory Visit
Admission: RE | Admit: 2013-11-09 | Discharge: 2013-11-09 | Disposition: A | Discharge status: Home / Self Care | Payer: PRIVATE HEALTH INSURANCE | Source: Ambulatory Visit | Attending: Radiation Oncology | Admitting: Radiation Oncology

## 2013-11-09 DIAGNOSIS — Z510 Encounter for antineoplastic radiation therapy: Secondary | ICD-10-CM | POA: Diagnosis not present

## 2013-11-10 ENCOUNTER — Ambulatory Visit: Payer: PRIVATE HEALTH INSURANCE

## 2013-11-10 ENCOUNTER — Ambulatory Visit
Admission: RE | Admit: 2013-11-10 | Discharge: 2013-11-10 | Disposition: A | Discharge status: Home / Self Care | Payer: PRIVATE HEALTH INSURANCE | Source: Ambulatory Visit | Attending: Radiation Oncology | Admitting: Radiation Oncology

## 2013-11-10 DIAGNOSIS — Z510 Encounter for antineoplastic radiation therapy: Secondary | ICD-10-CM | POA: Diagnosis not present

## 2013-11-11 ENCOUNTER — Ambulatory Visit: Payer: PRIVATE HEALTH INSURANCE

## 2013-11-11 ENCOUNTER — Ambulatory Visit
Admission: RE | Admit: 2013-11-11 | Discharge: 2013-11-11 | Disposition: A | Discharge status: Home / Self Care | Payer: PRIVATE HEALTH INSURANCE | Source: Ambulatory Visit | Attending: Radiation Oncology | Admitting: Radiation Oncology

## 2013-11-11 DIAGNOSIS — Z510 Encounter for antineoplastic radiation therapy: Secondary | ICD-10-CM | POA: Diagnosis not present

## 2013-11-14 ENCOUNTER — Ambulatory Visit
Admission: RE | Admit: 2013-11-14 | Discharge: 2013-11-14 | Disposition: A | Discharge status: Home / Self Care | Payer: PRIVATE HEALTH INSURANCE | Source: Ambulatory Visit | Attending: Radiation Oncology | Admitting: Radiation Oncology

## 2013-11-14 ENCOUNTER — Ambulatory Visit: Payer: PRIVATE HEALTH INSURANCE

## 2013-11-14 ENCOUNTER — Encounter: Payer: Self-pay | Admitting: Radiation Oncology

## 2013-11-14 VITALS — BP 150/94 | HR 81 | Temp 98.0°F | Resp 20 | Wt 219.1 lb

## 2013-11-14 DIAGNOSIS — Z510 Encounter for antineoplastic radiation therapy: Secondary | ICD-10-CM | POA: Diagnosis not present

## 2013-11-14 DIAGNOSIS — C61 Malignant neoplasm of prostate: Secondary | ICD-10-CM

## 2013-11-14 NOTE — Progress Notes (Signed)
Weekly rad txs 8/33 pelvis , no dysuria, hematuria, no c/o nocturia, regular bowel movements every am stated, appetite good, no c/o pain  4:06 PM

## 2013-11-14 NOTE — Progress Notes (Signed)
Weekly Management Note:  Site: Prostate bed Current Dose:  1600 cGy Projected Dose: 6600  cGy  Narrative: The patient is seen today for routine under treatment assessment. CBCT/MVCT images/port films were reviewed. The chart was reviewed.   Bladder filling is excellent. No GU or GI difficulties.  Physical Examination:  Filed Vitals:   11/14/13 1605  BP: 150/94  Pulse: 81  Temp: 98 F (36.7 C)  Resp: 20  .  Weight: 219 lb 1.6 oz (99.383 kg). No change.  Impression: Tolerating radiation therapy well.  Plan: Continue radiation therapy as planned.

## 2013-11-15 ENCOUNTER — Ambulatory Visit: Payer: PRIVATE HEALTH INSURANCE

## 2013-11-15 ENCOUNTER — Ambulatory Visit
Admission: RE | Admit: 2013-11-15 | Discharge: 2013-11-15 | Disposition: A | Discharge status: Home / Self Care | Payer: PRIVATE HEALTH INSURANCE | Source: Ambulatory Visit | Attending: Radiation Oncology | Admitting: Radiation Oncology

## 2013-11-15 DIAGNOSIS — Z510 Encounter for antineoplastic radiation therapy: Secondary | ICD-10-CM | POA: Diagnosis not present

## 2013-11-16 ENCOUNTER — Ambulatory Visit: Payer: PRIVATE HEALTH INSURANCE

## 2013-11-16 ENCOUNTER — Ambulatory Visit
Admission: RE | Admit: 2013-11-16 | Discharge: 2013-11-16 | Disposition: A | Discharge status: Home / Self Care | Payer: PRIVATE HEALTH INSURANCE | Source: Ambulatory Visit | Attending: Radiation Oncology | Admitting: Radiation Oncology

## 2013-11-16 DIAGNOSIS — Z510 Encounter for antineoplastic radiation therapy: Secondary | ICD-10-CM | POA: Diagnosis not present

## 2013-11-17 ENCOUNTER — Ambulatory Visit: Payer: PRIVATE HEALTH INSURANCE

## 2013-11-17 ENCOUNTER — Ambulatory Visit
Admission: RE | Admit: 2013-11-17 | Discharge: 2013-11-17 | Disposition: A | Discharge status: Home / Self Care | Payer: PRIVATE HEALTH INSURANCE | Source: Ambulatory Visit | Attending: Radiation Oncology | Admitting: Radiation Oncology

## 2013-11-17 DIAGNOSIS — Z510 Encounter for antineoplastic radiation therapy: Secondary | ICD-10-CM | POA: Diagnosis not present

## 2013-11-18 ENCOUNTER — Ambulatory Visit
Admission: RE | Admit: 2013-11-18 | Discharge: 2013-11-18 | Disposition: A | Discharge status: Home / Self Care | Payer: PRIVATE HEALTH INSURANCE | Source: Ambulatory Visit | Attending: Radiation Oncology | Admitting: Radiation Oncology

## 2013-11-18 ENCOUNTER — Ambulatory Visit: Payer: PRIVATE HEALTH INSURANCE

## 2013-11-18 DIAGNOSIS — Z510 Encounter for antineoplastic radiation therapy: Secondary | ICD-10-CM | POA: Diagnosis not present

## 2013-11-21 ENCOUNTER — Ambulatory Visit: Payer: PRIVATE HEALTH INSURANCE

## 2013-11-21 ENCOUNTER — Ambulatory Visit
Admission: RE | Admit: 2013-11-21 | Discharge: 2013-11-21 | Disposition: A | Discharge status: Home / Self Care | Payer: PRIVATE HEALTH INSURANCE | Source: Ambulatory Visit | Attending: Radiation Oncology | Admitting: Radiation Oncology

## 2013-11-21 ENCOUNTER — Ambulatory Visit: Payer: PRIVATE HEALTH INSURANCE | Admitting: Radiation Oncology

## 2013-11-21 DIAGNOSIS — Z510 Encounter for antineoplastic radiation therapy: Secondary | ICD-10-CM | POA: Diagnosis not present

## 2013-11-22 ENCOUNTER — Ambulatory Visit
Admission: RE | Admit: 2013-11-22 | Discharge: 2013-11-22 | Disposition: A | Discharge status: Home / Self Care | Payer: PRIVATE HEALTH INSURANCE | Source: Ambulatory Visit | Attending: Radiation Oncology | Admitting: Radiation Oncology

## 2013-11-22 ENCOUNTER — Ambulatory Visit: Payer: PRIVATE HEALTH INSURANCE

## 2013-11-22 DIAGNOSIS — Z510 Encounter for antineoplastic radiation therapy: Secondary | ICD-10-CM | POA: Diagnosis not present

## 2013-11-23 ENCOUNTER — Ambulatory Visit
Admission: RE | Admit: 2013-11-23 | Discharge: 2013-11-23 | Disposition: A | Discharge status: Home / Self Care | Payer: PRIVATE HEALTH INSURANCE | Source: Ambulatory Visit | Attending: Radiation Oncology | Admitting: Radiation Oncology

## 2013-11-23 ENCOUNTER — Encounter: Payer: Self-pay | Admitting: Radiation Oncology

## 2013-11-23 ENCOUNTER — Ambulatory Visit: Payer: PRIVATE HEALTH INSURANCE

## 2013-11-23 VITALS — BP 141/83 | HR 85 | Temp 98.8°F | Resp 16 | Ht 69.0 in | Wt 218.0 lb

## 2013-11-23 DIAGNOSIS — Z510 Encounter for antineoplastic radiation therapy: Secondary | ICD-10-CM | POA: Diagnosis not present

## 2013-11-23 DIAGNOSIS — C61 Malignant neoplasm of prostate: Secondary | ICD-10-CM

## 2013-11-23 NOTE — Progress Notes (Signed)
Chad Sandoval has completed 15/33 to his prostate.  He denies dysuria and hematuria.  He reports getting up once per night to urinate.  He reports an increase in urinary frequency for the past 3 days.  It is back to normal today.  He reports his stools are softer.  He reports slight fatigue.

## 2013-11-23 NOTE — Progress Notes (Signed)
Weekly Management Note:  Site: Prostate bed Current Dose:  3000  cGy Projected Dose: 6600  cGy  Narrative: The patient is seen today for routine under treatment assessment. CBCT/MVCT images/port films were reviewed. The chart was reviewed.   Bladder filling is less than optimal today. I reviewed the cone beam CT images with the patient. He did not feel that his bladder was full today. Earlier this week he was having more urinary frequency and nocturia but this is now improved.  Physical Examination:  Filed Vitals:   11/23/13 1610  BP: 141/83  Pulse: 85  Temp: 98.8 F (37.1 C)  Resp: 16  .  Weight: 218 lb (98.884 kg). No change.  Impression: Tolerating radiation therapy well.  Plan: Continue radiation therapy as planned.

## 2013-11-24 ENCOUNTER — Ambulatory Visit
Admission: RE | Admit: 2013-11-24 | Discharge: 2013-11-24 | Disposition: A | Discharge status: Home / Self Care | Payer: PRIVATE HEALTH INSURANCE | Source: Ambulatory Visit | Attending: Radiation Oncology | Admitting: Radiation Oncology

## 2013-11-24 ENCOUNTER — Ambulatory Visit: Payer: PRIVATE HEALTH INSURANCE

## 2013-11-24 DIAGNOSIS — Z510 Encounter for antineoplastic radiation therapy: Secondary | ICD-10-CM | POA: Diagnosis present

## 2013-11-24 DIAGNOSIS — C61 Malignant neoplasm of prostate: Secondary | ICD-10-CM | POA: Diagnosis not present

## 2013-11-25 ENCOUNTER — Ambulatory Visit: Payer: PRIVATE HEALTH INSURANCE

## 2013-11-25 ENCOUNTER — Ambulatory Visit
Admission: RE | Admit: 2013-11-25 | Discharge: 2013-11-25 | Disposition: A | Discharge status: Home / Self Care | Payer: PRIVATE HEALTH INSURANCE | Source: Ambulatory Visit | Attending: Radiation Oncology | Admitting: Radiation Oncology

## 2013-11-25 DIAGNOSIS — Z510 Encounter for antineoplastic radiation therapy: Secondary | ICD-10-CM | POA: Diagnosis not present

## 2013-11-28 ENCOUNTER — Ambulatory Visit
Admission: RE | Admit: 2013-11-28 | Discharge: 2013-11-28 | Disposition: A | Discharge status: Home / Self Care | Payer: PRIVATE HEALTH INSURANCE | Source: Ambulatory Visit | Attending: Radiation Oncology | Admitting: Radiation Oncology

## 2013-11-28 ENCOUNTER — Ambulatory Visit: Payer: PRIVATE HEALTH INSURANCE

## 2013-11-28 VITALS — BP 143/86 | HR 67 | Temp 98.4°F | Wt 221.4 lb

## 2013-11-28 DIAGNOSIS — C61 Malignant neoplasm of prostate: Secondary | ICD-10-CM

## 2013-11-28 DIAGNOSIS — Z510 Encounter for antineoplastic radiation therapy: Secondary | ICD-10-CM | POA: Diagnosis not present

## 2013-11-28 NOTE — Progress Notes (Signed)
Weekly assessment of radiation to prostate fossa.Denies pain.Frequency of urination without urgency or burning.Denies fatigue.

## 2013-11-28 NOTE — Progress Notes (Signed)
Weekly Management Note:  Site: Prostate bed Current Dose:  3600  cGy Projected Dose: 6600  cGy  Narrative: The patient is seen today for routine under treatment assessment. CBCT/MVCT images/port films were reviewed. The chart was reviewed.   Bladder filling satisfactory. No new GU or GI difficulties.  Physical Examination:  Filed Vitals:   11/28/13 1631  BP: 143/86  Pulse: 67  Temp: 98.4 F (36.9 C)  .  Weight: 221 lb 6.4 oz (100.426 kg). No change.  Impression: Tolerating radiation therapy well.  Plan: Continue radiation therapy as planned.

## 2013-11-29 ENCOUNTER — Ambulatory Visit
Admission: RE | Admit: 2013-11-29 | Discharge: 2013-11-29 | Disposition: A | Discharge status: Home / Self Care | Payer: PRIVATE HEALTH INSURANCE | Source: Ambulatory Visit | Attending: Radiation Oncology | Admitting: Radiation Oncology

## 2013-11-29 ENCOUNTER — Ambulatory Visit: Payer: PRIVATE HEALTH INSURANCE

## 2013-11-29 DIAGNOSIS — Z510 Encounter for antineoplastic radiation therapy: Secondary | ICD-10-CM | POA: Diagnosis not present

## 2013-11-30 ENCOUNTER — Ambulatory Visit
Admission: RE | Admit: 2013-11-30 | Discharge: 2013-11-30 | Disposition: A | Discharge status: Home / Self Care | Payer: PRIVATE HEALTH INSURANCE | Source: Ambulatory Visit | Attending: Radiation Oncology | Admitting: Radiation Oncology

## 2013-11-30 ENCOUNTER — Ambulatory Visit: Payer: PRIVATE HEALTH INSURANCE

## 2013-11-30 DIAGNOSIS — Z510 Encounter for antineoplastic radiation therapy: Secondary | ICD-10-CM | POA: Diagnosis not present

## 2013-12-01 ENCOUNTER — Ambulatory Visit
Admission: RE | Admit: 2013-12-01 | Discharge: 2013-12-01 | Disposition: A | Discharge status: Home / Self Care | Payer: PRIVATE HEALTH INSURANCE | Source: Ambulatory Visit | Attending: Radiation Oncology | Admitting: Radiation Oncology

## 2013-12-01 ENCOUNTER — Ambulatory Visit: Payer: PRIVATE HEALTH INSURANCE

## 2013-12-01 DIAGNOSIS — Z510 Encounter for antineoplastic radiation therapy: Secondary | ICD-10-CM | POA: Diagnosis not present

## 2013-12-02 ENCOUNTER — Ambulatory Visit
Admission: RE | Admit: 2013-12-02 | Discharge: 2013-12-02 | Disposition: A | Discharge status: Home / Self Care | Payer: PRIVATE HEALTH INSURANCE | Source: Ambulatory Visit | Attending: Radiation Oncology | Admitting: Radiation Oncology

## 2013-12-02 ENCOUNTER — Ambulatory Visit: Payer: PRIVATE HEALTH INSURANCE

## 2013-12-02 DIAGNOSIS — Z510 Encounter for antineoplastic radiation therapy: Secondary | ICD-10-CM | POA: Diagnosis not present

## 2013-12-05 ENCOUNTER — Ambulatory Visit
Admission: RE | Admit: 2013-12-05 | Discharge: 2013-12-05 | Disposition: A | Discharge status: Home / Self Care | Payer: PRIVATE HEALTH INSURANCE | Source: Ambulatory Visit | Attending: Radiation Oncology | Admitting: Radiation Oncology

## 2013-12-05 ENCOUNTER — Ambulatory Visit: Payer: PRIVATE HEALTH INSURANCE

## 2013-12-05 VITALS — BP 144/84 | HR 67 | Temp 98.0°F | Wt 219.7 lb

## 2013-12-05 DIAGNOSIS — C61 Malignant neoplasm of prostate: Secondary | ICD-10-CM

## 2013-12-05 DIAGNOSIS — Z510 Encounter for antineoplastic radiation therapy: Secondary | ICD-10-CM | POA: Diagnosis not present

## 2013-12-05 NOTE — Progress Notes (Signed)
Weekly assessment of radiation to prostate.Completed 23 of 33 treatments.Denies pain.No new urinary or bowel changes.

## 2013-12-05 NOTE — Progress Notes (Signed)
Weekly Management Note:  Site: Prostate bed Current Dose:  4600  cGy Projected Dose: 6600  cGy  Narrative: The patient is seen today for routine under treatment assessment. CBCT/MVCT images/port films were reviewed. The chart was reviewed.   Bladder filling is satisfactory but less than ideal. He did not feel that his bladder was completely full today. He does report some increase in urinary frequency along with nocturia x2-4 where baseline was nocturia x1-2.  Physical Examination:  Filed Vitals:   12/05/13 1617  BP: 144/84  Pulse: 67  Temp: 98 F (36.7 C)  .  Weight: 219 lb 11.2 oz (99.655 kg). No change.  Impression: Tolerating radiation therapy well. I encouraged him to improve his bladder filling.  Plan: Continue radiation therapy as planned.

## 2013-12-06 ENCOUNTER — Ambulatory Visit
Admission: RE | Admit: 2013-12-06 | Discharge: 2013-12-06 | Disposition: A | Discharge status: Home / Self Care | Payer: PRIVATE HEALTH INSURANCE | Source: Ambulatory Visit | Attending: Radiation Oncology | Admitting: Radiation Oncology

## 2013-12-06 ENCOUNTER — Ambulatory Visit: Payer: PRIVATE HEALTH INSURANCE

## 2013-12-06 DIAGNOSIS — Z510 Encounter for antineoplastic radiation therapy: Secondary | ICD-10-CM | POA: Diagnosis not present

## 2013-12-07 ENCOUNTER — Ambulatory Visit
Admission: RE | Admit: 2013-12-07 | Discharge: 2013-12-07 | Disposition: A | Discharge status: Home / Self Care | Payer: PRIVATE HEALTH INSURANCE | Source: Ambulatory Visit | Attending: Radiation Oncology | Admitting: Radiation Oncology

## 2013-12-07 ENCOUNTER — Ambulatory Visit: Payer: PRIVATE HEALTH INSURANCE

## 2013-12-07 DIAGNOSIS — Z510 Encounter for antineoplastic radiation therapy: Secondary | ICD-10-CM | POA: Diagnosis not present

## 2013-12-08 ENCOUNTER — Ambulatory Visit
Admission: RE | Admit: 2013-12-08 | Discharge: 2013-12-08 | Disposition: A | Discharge status: Home / Self Care | Payer: PRIVATE HEALTH INSURANCE | Source: Ambulatory Visit | Attending: Radiation Oncology | Admitting: Radiation Oncology

## 2013-12-08 ENCOUNTER — Ambulatory Visit: Payer: PRIVATE HEALTH INSURANCE

## 2013-12-08 DIAGNOSIS — Z510 Encounter for antineoplastic radiation therapy: Secondary | ICD-10-CM | POA: Diagnosis not present

## 2013-12-09 ENCOUNTER — Ambulatory Visit
Admission: RE | Admit: 2013-12-09 | Discharge: 2013-12-09 | Disposition: A | Discharge status: Home / Self Care | Payer: PRIVATE HEALTH INSURANCE | Source: Ambulatory Visit | Attending: Radiation Oncology | Admitting: Radiation Oncology

## 2013-12-09 ENCOUNTER — Ambulatory Visit: Payer: PRIVATE HEALTH INSURANCE

## 2013-12-09 DIAGNOSIS — Z510 Encounter for antineoplastic radiation therapy: Secondary | ICD-10-CM | POA: Diagnosis not present

## 2013-12-12 ENCOUNTER — Ambulatory Visit
Admission: RE | Admit: 2013-12-12 | Discharge: 2013-12-12 | Disposition: A | Discharge status: Home / Self Care | Payer: PRIVATE HEALTH INSURANCE | Source: Ambulatory Visit | Attending: Radiation Oncology | Admitting: Radiation Oncology

## 2013-12-12 ENCOUNTER — Ambulatory Visit: Payer: PRIVATE HEALTH INSURANCE

## 2013-12-12 VITALS — BP 143/90 | HR 75 | Temp 97.8°F | Resp 12 | Wt 221.4 lb

## 2013-12-12 DIAGNOSIS — C61 Malignant neoplasm of prostate: Secondary | ICD-10-CM

## 2013-12-12 DIAGNOSIS — Z510 Encounter for antineoplastic radiation therapy: Secondary | ICD-10-CM | POA: Diagnosis not present

## 2013-12-12 NOTE — Progress Notes (Signed)
Weekly Management Note:  Site: Prostate bed Current Dose:  5600  cGy Projected Dose: 6600  cGy  Narrative: The patient is seen today for routine under treatment assessment. CBCT/MVCT images/port films were reviewed. The chart was reviewed.   Bladder filling satisfactory. No new GU or GI difficulty.  Physical Examination:  Filed Vitals:   12/12/13 1625  BP: 143/90  Pulse: 75  Temp: 97.8 F (36.6 C)  Resp: 12  .  Weight: 221 lb 6.4 oz (100.426 kg). No change.  Impression: Tolerating radiation therapy well.  Plan: Continue radiation therapy as planned.

## 2013-12-12 NOTE — Progress Notes (Signed)
He is currently in no pain. Pt complains of, Loss of Sleep and Fatigue.  Reports urinary frequency and urinary retention.  Pt states they urinate 2 - 3 times per night.  Pt reports, a bowel movement every once day. The patient eats a regular, healthy diet.

## 2013-12-13 ENCOUNTER — Ambulatory Visit: Payer: PRIVATE HEALTH INSURANCE

## 2013-12-13 ENCOUNTER — Ambulatory Visit
Admission: RE | Admit: 2013-12-13 | Discharge: 2013-12-13 | Disposition: A | Discharge status: Home / Self Care | Payer: PRIVATE HEALTH INSURANCE | Source: Ambulatory Visit | Attending: Radiation Oncology | Admitting: Radiation Oncology

## 2013-12-13 DIAGNOSIS — Z510 Encounter for antineoplastic radiation therapy: Secondary | ICD-10-CM | POA: Diagnosis not present

## 2013-12-14 ENCOUNTER — Ambulatory Visit: Payer: PRIVATE HEALTH INSURANCE

## 2013-12-14 ENCOUNTER — Ambulatory Visit
Admission: RE | Admit: 2013-12-14 | Discharge: 2013-12-14 | Disposition: A | Discharge status: Home / Self Care | Payer: PRIVATE HEALTH INSURANCE | Source: Ambulatory Visit | Attending: Radiation Oncology | Admitting: Radiation Oncology

## 2013-12-14 DIAGNOSIS — Z510 Encounter for antineoplastic radiation therapy: Secondary | ICD-10-CM | POA: Diagnosis not present

## 2013-12-15 ENCOUNTER — Ambulatory Visit
Admission: RE | Admit: 2013-12-15 | Discharge: 2013-12-15 | Disposition: A | Discharge status: Home / Self Care | Payer: PRIVATE HEALTH INSURANCE | Source: Ambulatory Visit | Attending: Radiation Oncology | Admitting: Radiation Oncology

## 2013-12-15 ENCOUNTER — Ambulatory Visit: Payer: PRIVATE HEALTH INSURANCE

## 2013-12-15 DIAGNOSIS — Z510 Encounter for antineoplastic radiation therapy: Secondary | ICD-10-CM | POA: Diagnosis not present

## 2013-12-16 ENCOUNTER — Ambulatory Visit
Admission: RE | Admit: 2013-12-16 | Discharge: 2013-12-16 | Disposition: A | Discharge status: Home / Self Care | Payer: PRIVATE HEALTH INSURANCE | Source: Ambulatory Visit | Attending: Radiation Oncology | Admitting: Radiation Oncology

## 2013-12-16 ENCOUNTER — Ambulatory Visit: Payer: PRIVATE HEALTH INSURANCE

## 2013-12-16 DIAGNOSIS — Z510 Encounter for antineoplastic radiation therapy: Secondary | ICD-10-CM | POA: Diagnosis not present

## 2013-12-19 ENCOUNTER — Ambulatory Visit
Admission: RE | Admit: 2013-12-19 | Discharge: 2013-12-19 | Disposition: A | Discharge status: Home / Self Care | Payer: PRIVATE HEALTH INSURANCE | Source: Ambulatory Visit | Attending: Radiation Oncology | Admitting: Radiation Oncology

## 2013-12-19 ENCOUNTER — Encounter: Payer: Self-pay | Admitting: Radiation Oncology

## 2013-12-19 ENCOUNTER — Ambulatory Visit: Payer: PRIVATE HEALTH INSURANCE

## 2013-12-19 VITALS — BP 153/95 | HR 86 | Resp 16 | Wt 222.2 lb

## 2013-12-19 DIAGNOSIS — C61 Malignant neoplasm of prostate: Secondary | ICD-10-CM

## 2013-12-19 DIAGNOSIS — Z510 Encounter for antineoplastic radiation therapy: Secondary | ICD-10-CM | POA: Diagnosis not present

## 2013-12-19 NOTE — Progress Notes (Signed)
[facility] End of Treatment Note  Name:Erastus PINCHUS WECKWERTH  Date: 12/19/2013 EEF:[id number] DOB:[birth year]   Status:outpatient    CC: LINTHAVONG,KANHKA  Dr. Lawerance Bach III  REFERRING PHYSICIAN: Dr. Lawerance Bach III   DIAGNOSIS: PSA recurrent carcinoma the prostate   INDICATION FOR TREATMENT: Curative   TREATMENT DATES: 11/03/2013 through 12/19/2013                          SITE/DOSE:    Prostate bed 6600 cGy in 33 sessions                        BEAMS/ENERGY:     6 MV photons, dual ARC VMAT IMRT              NARRATIVE:   Mr. Habenicht tolerated his treatment beautifully with no significant GU or GI difficulty during his course of therapy.   He maintained a comfortably full bladder during his course of treatment  to minimize urinary toxicity.                   PLAN: Routine followup in one month. Patient instructed to call if questions or worsening complaints in interim.

## 2013-12-19 NOTE — Progress Notes (Signed)
Denies dysuria or hematuria. Denies diarrhea or fatigue. Denies pain. BP slightly elevated. Weight stable. Reports frequency and retention continue. Given one month follow up appointment card. Understands to contact staff with needs.

## 2013-12-19 NOTE — Progress Notes (Signed)
Weekly Management Note:  Site:Prostate bed Current Dose:  6600  cGy Projected Dose: 6600  cGy  Narrative: The patient is seen today for routine under treatment assessment. CBCT/MVCT images/port films were reviewed. The chart was reviewed.   Bladder filling is excellent. No new GU or GI difficulty.  Physical Examination:  Filed Vitals:   12/19/13 1630  BP: 153/95  Pulse: 86  Resp: 16  .  Weight: 222 lb 3.2 oz (100.789 kg). No change.  Impression: Tolerating radiation therapy well. Radiation therapy completed.  Plan: One-month follow-up.

## 2013-12-19 NOTE — Progress Notes (Signed)
Took cancer policy paperwork to [place]. Patient requesting once complete paperwork be mailed to his residence.

## 2013-12-20 ENCOUNTER — Encounter: Payer: Self-pay | Admitting: Radiation Oncology

## 2013-12-20 NOTE — Progress Notes (Signed)
[facility] paperwork received and forwarded to nurse for completion.

## 2014-01-02 ENCOUNTER — Telehealth: Payer: Self-pay

## 2014-01-02 DIAGNOSIS — N320 Bladder-neck obstruction: Secondary | ICD-10-CM | POA: Insufficient documentation

## 2014-01-02 DIAGNOSIS — R339 Retention of urine, unspecified: Secondary | ICD-10-CM | POA: Insufficient documentation

## 2014-01-02 NOTE — Telephone Encounter (Signed)
Patient called to state he has been having urgency all through the night and now is unable to void as of about 7:00 [employer] radiation to pelvis on 12/19/13.I called Dr.Davis office and spoke with Crystal, he will be able to see him this morning at 11:00 am at the [place] office at [address]Called  patient back and told him of appointment.

## 2014-01-11 ENCOUNTER — Encounter: Payer: Self-pay | Admitting: Radiation Oncology

## 2014-01-17 ENCOUNTER — Encounter: Payer: Self-pay | Admitting: Radiation Oncology

## 2014-01-17 ENCOUNTER — Telehealth: Payer: Self-pay | Admitting: *Deleted

## 2014-01-17 ENCOUNTER — Ambulatory Visit
Admission: RE | Admit: 2014-01-17 | Discharge: 2014-01-17 | Disposition: A | Discharge status: Home / Self Care | Payer: PRIVATE HEALTH INSURANCE | Source: Ambulatory Visit | Attending: Radiation Oncology | Admitting: Radiation Oncology

## 2014-01-17 VITALS — BP 152/87 | HR 82 | Resp 16 | Wt 221.4 lb

## 2014-01-17 DIAGNOSIS — C61 Malignant neoplasm of prostate: Secondary | ICD-10-CM

## 2014-01-17 HISTORY — DX: Personal history of irradiation: Z92.3

## 2014-01-17 NOTE — Progress Notes (Signed)
CC: Dr. Lawerance Bach  Follow-up note:  Mr. Terriquez returns today approximately 1 month following completion of external beam/IMRT in the management of his PSA recurrent carcinoma the prostate.  2 weeks ago, he went into urinary retention and underwent dilatation by Dr. Rosana Hoes.  His urine flow is now back to normal.  No GI difficulties.  He will see Dr. Rosana Hoes for a follow-up visit and a PSA determination in February 2016.  Physical examination: Alert and oriented. Filed Vitals:   01/17/14 1125  BP: 152/87  Pulse: 82  Resp: 16   Rectal examination not performed today.  Impression: Satisfactory progress with treatment-related edema resulting in urinary outlet obstruction.  He is now doing well.  Plan: Follow-up visit with Dr. Rosana Hoes in February 2016.  I've not scheduled a follow-up visit here and I ask that Dr. Rosana Hoes keep me posted on his progress and PSA determinations. Marland Kitchen t

## 2014-01-17 NOTE — Progress Notes (Signed)
Patient was seen by his urologist two weeks because he was unable to void. Reports his urologist "opened up the tube his urine comes out of." Denies dysuria or hematuria. Reports frequency is improving slowly. Reports a strong steady urine stream. Denies difficulty emptying his bladder. Denies incontinence or leakage. Reports nocturia x2. Denies diarrhea or rectal irritation. Reports his energy level is slowly improving.

## 2014-01-25 ENCOUNTER — Ambulatory Visit: Payer: Self-pay | Admitting: Oncology

## 2014-01-25 LAB — CBC CANCER CENTER
BASOS ABS: 0.1 x10 3/mm (ref 0.0–0.1)
BASOS PCT: 1 %
EOS ABS: 0.1 x10 3/mm (ref 0.0–0.7)
EOS PCT: 1.2 %
HCT: 50.1 % (ref 40.0–52.0)
HGB: 16.7 g/dL (ref 13.0–18.0)
LYMPHS PCT: 20.7 %
Lymphocyte #: 2.1 x10 3/mm (ref 1.0–3.6)
MCH: 31.3 pg (ref 26.0–34.0)
MCHC: 33.3 g/dL (ref 32.0–36.0)
MCV: 94 fL (ref 80–100)
Monocyte #: 1 x10 3/mm (ref 0.2–1.0)
Monocyte %: 10 %
NEUTROS ABS: 6.9 x10 3/mm — AB (ref 1.4–6.5)
NEUTROS PCT: 67.1 %
Platelet: 268 x10 3/mm (ref 150–440)
RBC: 5.33 10*6/uL (ref 4.40–5.90)
RDW: 14.2 % (ref 11.5–14.5)
WBC: 10.3 x10 3/mm (ref 3.8–10.6)

## 2014-01-25 LAB — COMPREHENSIVE METABOLIC PANEL
Albumin: 4.1 g/dL (ref 3.4–5.0)
Alkaline Phosphatase: 114 U/L
Anion Gap: 8 (ref 7–16)
BUN: 14 mg/dL (ref 7–18)
Bilirubin,Total: 0.7 mg/dL (ref 0.2–1.0)
CALCIUM: 9.5 mg/dL (ref 8.5–10.1)
CHLORIDE: 106 mmol/L (ref 98–107)
Co2: 29 mmol/L (ref 21–32)
Creatinine: 1.03 mg/dL (ref 0.60–1.30)
EGFR (African American): >60
EGFR (Non-African Amer.): >60
Glucose: 111 mg/dL — ABNORMAL HIGH (ref 65–99)
OSMOLALITY: 286 (ref 275–301)
POTASSIUM: 4.3 mmol/L (ref 3.5–5.1)
SGOT(AST): 22 U/L (ref 15–37)
SGPT (ALT): 43 U/L
Sodium: 143 mmol/L (ref 136–145)
Total Protein: 6.8 g/dL (ref 6.4–8.2)

## 2014-01-26 NOTE — Telephone Encounter (Signed)
error 

## 2014-02-07 DIAGNOSIS — E119 Type 2 diabetes mellitus without complications: Secondary | ICD-10-CM | POA: Insufficient documentation

## 2014-02-24 ENCOUNTER — Ambulatory Visit: Payer: Self-pay | Admitting: Oncology

## 2014-02-24 DIAGNOSIS — G20A1 Parkinson's disease without dyskinesia, without mention of fluctuations: Secondary | ICD-10-CM

## 2014-02-24 DIAGNOSIS — G20 Parkinson's disease: Secondary | ICD-10-CM

## 2014-02-24 HISTORY — DX: Parkinson's disease without dyskinesia, without mention of fluctuations: G20.A1

## 2014-02-24 HISTORY — DX: Parkinson's disease: G20

## 2014-07-21 ENCOUNTER — Other Ambulatory Visit: Payer: Self-pay | Admitting: *Deleted

## 2014-07-21 DIAGNOSIS — C61 Malignant neoplasm of prostate: Secondary | ICD-10-CM

## 2014-07-26 ENCOUNTER — Encounter (INDEPENDENT_AMBULATORY_CARE_PROVIDER_SITE_OTHER): Payer: Self-pay

## 2014-07-26 ENCOUNTER — Inpatient Hospital Stay: Payer: Medicare Other | Attending: Oncology

## 2014-07-26 ENCOUNTER — Inpatient Hospital Stay (HOSPITAL_BASED_OUTPATIENT_CLINIC_OR_DEPARTMENT_OTHER): Payer: Medicare Other | Admitting: Oncology

## 2014-07-26 VITALS — BP 187/93 | HR 65 | Temp 95.3°F | Wt 225.8 lb

## 2014-07-26 DIAGNOSIS — C8307 Small cell B-cell lymphoma, spleen: Secondary | ICD-10-CM

## 2014-07-26 DIAGNOSIS — Z8546 Personal history of malignant neoplasm of prostate: Secondary | ICD-10-CM | POA: Insufficient documentation

## 2014-07-26 DIAGNOSIS — Z923 Personal history of irradiation: Secondary | ICD-10-CM | POA: Insufficient documentation

## 2014-07-26 DIAGNOSIS — R5383 Other fatigue: Secondary | ICD-10-CM | POA: Diagnosis not present

## 2014-07-26 DIAGNOSIS — R531 Weakness: Secondary | ICD-10-CM | POA: Diagnosis not present

## 2014-07-26 DIAGNOSIS — D6959 Other secondary thrombocytopenia: Secondary | ICD-10-CM | POA: Diagnosis not present

## 2014-07-26 DIAGNOSIS — Z9081 Acquired absence of spleen: Secondary | ICD-10-CM | POA: Insufficient documentation

## 2014-07-26 DIAGNOSIS — Z7982 Long term (current) use of aspirin: Secondary | ICD-10-CM | POA: Diagnosis not present

## 2014-07-26 DIAGNOSIS — C61 Malignant neoplasm of prostate: Secondary | ICD-10-CM

## 2014-07-26 DIAGNOSIS — Z79899 Other long term (current) drug therapy: Secondary | ICD-10-CM

## 2014-07-26 DIAGNOSIS — C8590 Non-Hodgkin lymphoma, unspecified, unspecified site: Secondary | ICD-10-CM

## 2014-07-26 DIAGNOSIS — Z9079 Acquired absence of other genital organ(s): Secondary | ICD-10-CM | POA: Insufficient documentation

## 2014-07-26 LAB — COMPREHENSIVE METABOLIC PANEL
ALBUMIN: 4.5 g/dL (ref 3.5–5.0)
ALT: 40 U/L (ref 17–63)
ANION GAP: 4 — AB (ref 5–15)
AST: 26 U/L (ref 15–41)
Alkaline Phosphatase: 92 U/L (ref 38–126)
BILIRUBIN TOTAL: 0.9 mg/dL (ref 0.3–1.2)
BUN: 22 mg/dL — ABNORMAL HIGH (ref 6–20)
CO2: 27 mmol/L (ref 22–32)
Calcium: 9.2 mg/dL (ref 8.9–10.3)
Chloride: 108 mmol/L (ref 101–111)
Creatinine, Ser: 0.83 mg/dL (ref 0.61–1.24)
GFR calc Af Amer: >60 mL/min (ref >60)
GFR calc non Af Amer: >60 mL/min (ref >60)
Glucose, Bld: 116 mg/dL — ABNORMAL HIGH (ref 65–99)
Potassium: 4.7 mmol/L (ref 3.5–5.1)
Sodium: 139 mmol/L (ref 135–145)
Total Protein: 6.9 g/dL (ref 6.5–8.1)

## 2014-07-26 LAB — CBC WITH DIFFERENTIAL/PLATELET
Basophils Absolute: 0.1 10*3/uL (ref 0–0.1)
Basophils Relative: 1 %
EOS ABS: 0.1 10*3/uL (ref 0–0.7)
EOS PCT: 1 %
HCT: 49.3 % (ref 40.0–52.0)
HEMOGLOBIN: 16.3 g/dL (ref 13.0–18.0)
LYMPHS ABS: 3.1 10*3/uL (ref 1.0–3.6)
LYMPHS PCT: 30 %
MCH: 30.6 pg (ref 26.0–34.0)
MCHC: 33 g/dL (ref 32.0–36.0)
MCV: 92.8 fL (ref 80.0–100.0)
Monocytes Absolute: 1.1 10*3/uL — ABNORMAL HIGH (ref 0.2–1.0)
Monocytes Relative: 11 %
Neutro Abs: 6 10*3/uL (ref 1.4–6.5)
Neutrophils Relative %: 57 %
PLATELETS: 272 10*3/uL (ref 150–440)
RBC: 5.31 MIL/uL (ref 4.40–5.90)
RDW: 14.2 % (ref 11.5–14.5)
WBC: 10.5 10*3/uL (ref 3.8–10.6)

## 2014-07-26 NOTE — Progress Notes (Signed)
Pt has never smoked and does not have living will.

## 2014-07-28 ENCOUNTER — Encounter: Payer: Self-pay | Admitting: Oncology

## 2014-07-28 DIAGNOSIS — C8307 Small cell B-cell lymphoma, spleen: Secondary | ICD-10-CM

## 2014-07-28 HISTORY — DX: Small cell b-cell lymphoma, spleen: C83.07

## 2014-07-28 NOTE — Progress Notes (Signed)
[place] @ [facility] Telephone:(336) [phone]  Fax:(336) [place] OB: [birth year]  MR#: [id number]  BZJ#:[id number]  Patient Care Team: Dion Body, MD as PCP - General (Family Medicine)  CHIEF COMPLAINT:  Chief Complaint  Patient presents with  . Follow-up    Oncology History   thrombocytopenia, transient neutropenia enlarged spleen for several years.patient was being followed at [facility]. 2.Bone marrow biopsy (October, 2012)  No diagnostic features suggestive of myeloproliferative disease.   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  INTERVAL HISTORY:  Extent 69-year-old gentleman came today further follow-up has gained weight.  Feeling weak and tired.  Patient has an appointment pending with primary care physician for complete checkup.  Here for further follow-up regarding splenic marginal zone lymphoma status postsplenectomy REVIEW OF SYSTEMS:   Gen. Status: No chills.  No fever.  Feeling weak and fatigued Lungs: No shortness of breath Cardiac: No chest pain GI: No nausea no vomiting no diarrhea GU: No dysuria or hematuria Skin: No rash Neurological system: No dizziness.  No tingling.  No numbness Lower extremity: No swelling  As per HPI. Otherwise, a complete review of systems is negatve.  PAST MEDICAL HISTORY: Past Medical History  Diagnosis Date  . Prostate cancer 2003    s/p radical prostatectomy  . Thrombocytopenia   . History of radiation therapy 11/03/13- 12/19/13    prostate bed 6600 cGy 33  sessions  . 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 Retropubic prostatectomy  03/02/2001    Gleason 7  . 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 Cancer Father     prostate  . Alzheimer's disease Mother     ADVANCED DIRECTIVES: Patient does have advanced health care directive. HEALTH MAINTENANCE: History  Substance Use Topics  . Smoking status: Never Smoker   . Smokeless tobacco: Not on file  . Alcohol Use: No      Allergies  Allergen Reactions  . Oxycontin [Oxycodone Hcl] Other (See Comments)    "caused him to be really hot"    Current Outpatient Prescriptions  Medication Sig Dispense Refill  . acetaminophen (TYLENOL) 500 MG tablet Take 500 mg by mouth every 6 (six) hours as needed.    Marland Kitchen aspirin 81 MG chewable tablet Chew 81 mg by mouth.    . sildenafil (VIAGRA) 100 MG tablet Take 100 mg by mouth.     No current facility-administered medications for this visit.    OBJECTIVE:  Filed Vitals:   07/26/14 1219  BP: 187/93  Pulse: 65  Temp: 95.3 F (35.2 C)     Body mass index is 33.32 kg/(m^2).    ECOG FS:0 - Asymptomatic  PHYSICAL EXAM: General  status: Performance status is good.  Patient has not lost significant weight HEENT: No evidence of stomatitis. Sclera and conjunctivae :: No jaundice.   pale looking. Lungs: Air  entry equal on both sides.  No rhonchi.  No rales.  Cardiac: Heart sounds are normal.  No pericardial rub.  No murmur. Lymphatic system: Cervical, axillary, inguinal, lymph nodes not palpable GI: Abdomen is soft.  No ascites.  Liver spleen not palpable.  No tenderness.  Bowel sounds are within normal limit Lower extremity: No edema Neurological system: Higher functions, cranial nerves intact no evidence of peripheral neuropathy. Skin: No rash.  No ecchymosis.Marland Kitchen   LAB RESULTS:  Appointment on  07/26/2014  Component Date Value Ref Range Status  . WBC 07/26/2014 10.5  3.8 - 10.6 K/uL Final  . RBC 07/26/2014 5.31  4.40 - 5.90 MIL/uL Final  . Hemoglobin 07/26/2014 16.3  13.0 - 18.0 g/dL Final  . HCT 07/26/2014 49.3  40.0 - 52.0 % Final  . MCV 07/26/2014 92.8  80.0 - 100.0 fL Final  . MCH 07/26/2014 30.6  26.0 - 34.0 pg Final  . MCHC 07/26/2014 33.0  32.0 - 36.0 g/dL Final  . RDW 07/26/2014 14.2  11.5 - 14.5 % Final  . Platelets 07/26/2014 272  150 - 440 K/uL Final  . Neutrophils Relative % 07/26/2014 57   Final  . Neutro Abs 07/26/2014 6.0  1.4 - 6.5 K/uL Final  . Lymphocytes Relative 07/26/2014 30   Final  . Lymphs Abs 07/26/2014 3.1  1.0 - 3.6 K/uL Final  . Monocytes Relative 07/26/2014 11   Final  . Monocytes Absolute 07/26/2014 1.1* 0.2 - 1.0 K/uL Final  . Eosinophils Relative 07/26/2014 1   Final  . Eosinophils Absolute 07/26/2014 0.1  0 - 0.7 K/uL Final  . Basophils Relative 07/26/2014 1   Final  . Basophils Absolute 07/26/2014 0.1  0 - 0.1 K/uL Final  . Sodium 07/26/2014 139  135 - 145 mmol/L Final  . Potassium 07/26/2014 4.7  3.5 - 5.1 mmol/L Final  . Chloride 07/26/2014 108  101 - 111 mmol/L Final  . CO2 07/26/2014 27  22 - 32 mmol/L Final  . Glucose, Bld 07/26/2014 116* 65 - 99 mg/dL Final  . BUN 07/26/2014 22* 6 - 20 mg/dL Final  . Creatinine, Ser 07/26/2014 0.83  0.61 - 1.24 mg/dL Final  . Calcium 07/26/2014 9.2  8.9 - 10.3 mg/dL Final  . Total Protein 07/26/2014 6.9  6.5 - 8.1 g/dL Final  . Albumin 07/26/2014 4.5  3.5 - 5.0 g/dL Final  . AST 07/26/2014 26  15 - 41 U/L Final  . ALT 07/26/2014 40  17 - 63 U/L Final  . Alkaline Phosphatase 07/26/2014 92  38 - 126 U/L Final  . Total Bilirubin 07/26/2014 0.9  0.3 - 1.2 mg/dL Final  . GFR calc non Af Amer 07/26/2014 >60  >60 mL/min Final  . GFR calc Af Amer 07/26/2014 >60  >60 mL/min Final   Comment: (NOTE) The eGFR has been calculated using the CKD EPI equation. This calculation has not been validated in all  clinical situations. eGFR's persistently <60 mL/min signify possible Chronic Kidney Disease.   . 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 He also understands that He can call clinic at any time  with any questions, concerns, or complaints.    No matching staging information was found for the patient.  Forest Gleason, MD   07/28/2014 5:25 PM

## 2014-12-15 DIAGNOSIS — Z8546 Personal history of malignant neoplasm of prostate: Secondary | ICD-10-CM | POA: Insufficient documentation

## 2014-12-21 ENCOUNTER — Other Ambulatory Visit: Payer: Self-pay | Admitting: Neurology

## 2014-12-21 DIAGNOSIS — K117 Disturbances of salivary secretion: Secondary | ICD-10-CM

## 2014-12-21 DIAGNOSIS — G20 Parkinson's disease: Secondary | ICD-10-CM

## 2014-12-21 DIAGNOSIS — R200 Anesthesia of skin: Secondary | ICD-10-CM

## 2015-01-03 ENCOUNTER — Ambulatory Visit
Admission: RE | Admit: 2015-01-03 | Discharge: 2015-01-03 | Disposition: A | Discharge status: Home / Self Care | Payer: Medicare Other | Source: Ambulatory Visit | Attending: Neurology | Admitting: Neurology

## 2015-01-03 DIAGNOSIS — R590 Localized enlarged lymph nodes: Secondary | ICD-10-CM | POA: Insufficient documentation

## 2015-01-03 DIAGNOSIS — G20 Parkinson's disease: Secondary | ICD-10-CM | POA: Diagnosis present

## 2015-01-03 DIAGNOSIS — K117 Disturbances of salivary secretion: Secondary | ICD-10-CM | POA: Diagnosis present

## 2015-01-03 DIAGNOSIS — R200 Anesthesia of skin: Secondary | ICD-10-CM

## 2015-01-25 ENCOUNTER — Ambulatory Visit
Admission: RE | Admit: 2015-01-25 | Discharge: 2015-01-25 | Disposition: A | Discharge status: Home / Self Care | Payer: Medicare Other | Source: Ambulatory Visit | Attending: Oncology | Admitting: Oncology

## 2015-01-25 DIAGNOSIS — C8590 Non-Hodgkin lymphoma, unspecified, unspecified site: Secondary | ICD-10-CM | POA: Diagnosis present

## 2015-01-25 DIAGNOSIS — R590 Localized enlarged lymph nodes: Secondary | ICD-10-CM | POA: Insufficient documentation

## 2015-01-25 DIAGNOSIS — Z0189 Encounter for other specified special examinations: Secondary | ICD-10-CM | POA: Insufficient documentation

## 2015-01-25 LAB — GLUCOSE, CAPILLARY: GLUCOSE-CAPILLARY: 106 mg/dL — AB (ref 65–99)

## 2015-01-25 MED ORDER — FLUDEOXYGLUCOSE F - 18 (FDG) INJECTION
12.1900 | Freq: Once | INTRAVENOUS | Status: AC | PRN
  Administered 2015-01-25: 12.19 via INTRAVENOUS

## 2015-01-29 ENCOUNTER — Encounter: Payer: Self-pay | Admitting: Oncology

## 2015-01-29 ENCOUNTER — Inpatient Hospital Stay: Payer: Medicare Other | Attending: Oncology

## 2015-01-29 ENCOUNTER — Inpatient Hospital Stay (HOSPITAL_BASED_OUTPATIENT_CLINIC_OR_DEPARTMENT_OTHER): Payer: Medicare Other | Admitting: Oncology

## 2015-01-29 VITALS — BP 162/98 | HR 79 | Temp 96.4°F | Wt 225.3 lb

## 2015-01-29 DIAGNOSIS — Z923 Personal history of irradiation: Secondary | ICD-10-CM | POA: Insufficient documentation

## 2015-01-29 DIAGNOSIS — Z7982 Long term (current) use of aspirin: Secondary | ICD-10-CM

## 2015-01-29 DIAGNOSIS — Z79899 Other long term (current) drug therapy: Secondary | ICD-10-CM | POA: Diagnosis not present

## 2015-01-29 DIAGNOSIS — D473 Essential (hemorrhagic) thrombocythemia: Secondary | ICD-10-CM

## 2015-01-29 DIAGNOSIS — R590 Localized enlarged lymph nodes: Secondary | ICD-10-CM | POA: Diagnosis not present

## 2015-01-29 DIAGNOSIS — Z9081 Acquired absence of spleen: Secondary | ICD-10-CM | POA: Diagnosis not present

## 2015-01-29 DIAGNOSIS — R531 Weakness: Secondary | ICD-10-CM | POA: Insufficient documentation

## 2015-01-29 DIAGNOSIS — E669 Obesity, unspecified: Secondary | ICD-10-CM | POA: Insufficient documentation

## 2015-01-29 DIAGNOSIS — R5383 Other fatigue: Secondary | ICD-10-CM

## 2015-01-29 DIAGNOSIS — Z8546 Personal history of malignant neoplasm of prostate: Secondary | ICD-10-CM | POA: Diagnosis not present

## 2015-01-29 DIAGNOSIS — Z8042 Family history of malignant neoplasm of prostate: Secondary | ICD-10-CM | POA: Insufficient documentation

## 2015-01-29 DIAGNOSIS — C8307 Small cell B-cell lymphoma, spleen: Secondary | ICD-10-CM | POA: Diagnosis not present

## 2015-01-29 DIAGNOSIS — C8590 Non-Hodgkin lymphoma, unspecified, unspecified site: Secondary | ICD-10-CM

## 2015-01-29 DIAGNOSIS — G20 Parkinson's disease: Secondary | ICD-10-CM

## 2015-01-29 DIAGNOSIS — Z9079 Acquired absence of other genital organ(s): Secondary | ICD-10-CM | POA: Diagnosis not present

## 2015-01-29 DIAGNOSIS — I10 Essential (primary) hypertension: Secondary | ICD-10-CM | POA: Insufficient documentation

## 2015-01-29 LAB — CBC WITH DIFFERENTIAL/PLATELET
BASOS PCT: 1 %
Basophils Absolute: 0.1 10*3/uL (ref 0–0.1)
EOS ABS: 0.1 10*3/uL (ref 0–0.7)
EOS PCT: 1 %
HCT: 48.9 % (ref 40.0–52.0)
HEMOGLOBIN: 16.4 g/dL (ref 13.0–18.0)
LYMPHS ABS: 2.8 10*3/uL (ref 1.0–3.6)
Lymphocytes Relative: 24 %
MCH: 31.1 pg (ref 26.0–34.0)
MCHC: 33.4 g/dL (ref 32.0–36.0)
MCV: 92.8 fL (ref 80.0–100.0)
MONO ABS: 1.1 10*3/uL — AB (ref 0.2–1.0)
MONOS PCT: 9 %
Neutro Abs: 7.5 10*3/uL — ABNORMAL HIGH (ref 1.4–6.5)
Neutrophils Relative %: 65 %
Platelets: 288 10*3/uL (ref 150–440)
RBC: 5.27 MIL/uL (ref 4.40–5.90)
RDW: 13.4 % (ref 11.5–14.5)
WBC: 11.6 10*3/uL — ABNORMAL HIGH (ref 3.8–10.6)

## 2015-01-29 LAB — COMPREHENSIVE METABOLIC PANEL
ALBUMIN: 4.9 g/dL (ref 3.5–5.0)
ALK PHOS: 99 U/L (ref 38–126)
ALT: 7 U/L — ABNORMAL LOW (ref 17–63)
ANION GAP: 7 (ref 5–15)
AST: 24 U/L (ref 15–41)
BUN: 21 mg/dL — AB (ref 6–20)
CALCIUM: 9.6 mg/dL (ref 8.9–10.3)
CO2: 24 mmol/L (ref 22–32)
CREATININE: 1.04 mg/dL (ref 0.61–1.24)
Chloride: 105 mmol/L (ref 101–111)
GFR calc non Af Amer: >60 mL/min (ref >60)
Glucose, Bld: 123 mg/dL — ABNORMAL HIGH (ref 65–99)
POTASSIUM: 4.3 mmol/L (ref 3.5–5.1)
SODIUM: 136 mmol/L (ref 135–145)
TOTAL PROTEIN: 7.1 g/dL (ref 6.5–8.1)
Total Bilirubin: 0.8 mg/dL (ref 0.3–1.2)

## 2015-01-29 LAB — LACTATE DEHYDROGENASE: LDH: 141 U/L (ref 98–192)

## 2015-01-29 NOTE — Progress Notes (Signed)
[place] @ [facility] [phone]  Fax:([phone]) [place] OB: 16-Jun-1945  MR#: [id number]  IRC#:[id number]  Patient Care Team: Dion Body, MD as PCP - General (Family Medicine)  CHIEF COMPLAINT:  Chief Complaint  Patient presents with  . Lymphoma    Oncology History   thrombocytopenia, transient neutropenia enlarged spleen for several years.patient was being followed at [facility]. 2.Bone marrow biopsy (October, 2012)  No diagnostic features suggestive of myeloproliferative disease.   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  INTERVAL HISTORY:  69 year old gentleman came today further follow-up has gained weight.  Feeling weak and tired.  Patient has an appointment pending with primary care physician for complete checkup.  Here for further follow-up regarding splenic marginal zone lymphoma status postsplenectomy  Patient had developed Parkinson's disease..  Was evaluated by a neurologist.  During that process and MRI scan of brain was done which has been reviewed independently.  No evidence of metastases to the brain was found however intraparotid lymphadenopathy was found.   Patient had a PET scan scheduled was also evaluated by ENT surgeon REVIEW OF SYSTEMS:   Gen. Status: No chills.  No fever.  Feeling weak and fatigued Lungs: No shortness of breath Cardiac: No chest pain GI: No nausea no vomiting no diarrhea GU: No dysuria or hematuria Skin: No rash Neurological system: No dizziness.  No  tingling.  No numbness Lower extremity: No swelling  As per HPI. Otherwise, a complete review of systems is negatve.  PAST MEDICAL HISTORY: Past Medical History  Diagnosis Date  . Prostate cancer [facility]) 2003    s/p radical prostatectomy  . Thrombocytopenia ([place])   . History of radiation therapy 11/03/13- 12/19/13    prostate bed 6600 cGy 33 sessions  . 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 Retropubic prostatectomy  03/02/2001    Gleason 7  . Splenectomy  04/2012    [facility]    FAMILY HISTORY Family History  Problem Relation Age of Onset  . Cancer Father     prostate  . Alzheimer's disease Mother     ADVANCED DIRECTIVES: Patient does have advanced health care directive. HEALTH MAINTENANCE: Social History  Substance Use Topics  . Smoking status: Never Smoker   . Smokeless tobacco: None  . Alcohol Use: No      Allergies  Allergen Reactions  . Oxycontin [Oxycodone Hcl] Other (See Comments)    "caused him to be really hot"    Current Outpatient Prescriptions  Medication Sig Dispense Refill  . acetaminophen (TYLENOL) 500 MG tablet Take 500 mg by mouth every 6 (six) hours as needed.    Marland Kitchen aspirin 81 MG chewable tablet Chew 81 mg by mouth.    . Cholecalciferol (VITAMIN D3) 10000 UNITS TABS Take by mouth.    Marland Kitchen lisinopril (PRINIVIL,ZESTRIL) 10 MG tablet Take by mouth.    . sildenafil (VIAGRA) 100 MG tablet Take 100 mg by mouth.    Marland Kitchen  carbidopa-levodopa (SINEMET IR) 25-100 MG tablet TAKE 1 TABLET BY MOUTH 3 (THREE) TIMES DAILY.  8  . Multiple Vitamin (MULTI-VITAMINS) TABS Take 1 tablet by mouth.     No current facility-administered medications for this visit.    OBJECTIVE:  Filed Vitals:   01/29/15 1049  BP: 162/98  Pulse: 79  Temp: 96.4 F (35.8 C)     Body mass index is 33.26 kg/(m^2).    ECOG FS:0 - Asymptomatic  PHYSICAL EXAM: General  status: Performance  status is good.  Patient has not lost significant weight HEENT: No evidence of stomatitis. Sclera and conjunctivae :: No jaundice.   pale looking. Lungs: Air  entry equal on both sides.  No rhonchi.  No rales.  Cardiac: Heart sounds are normal.  No pericardial rub.  No murmur. Lymphatic system: Cervical, axillary, inguinal, lymph nodes not palpable GI: Abdomen is soft.  No ascites.  Liver spleen not palpable.  No tenderness.  Bowel sounds are within normal limit Lower extremity: No edema Neurological system: Recently patient has developed rigidity and tremors was evaluated by a neurologist and was found to have Parkinson's disease  Skin: No rash.  No ecchymosis.Marland Kitchen   LAB RESULTS:  Appointment on 01/29/2015  Component Date Value Ref Range Status  . WBC 01/29/2015 11.6* 3.8 - 10.6 K/uL Final  . RBC 01/29/2015 5.27  4.40 - 5.90 MIL/uL Final  . Hemoglobin 01/29/2015 16.4  13.0 - 18.0 g/dL Final  . HCT 01/29/2015 48.9  40.0 - 52.0 % Final  . MCV 01/29/2015 92.8  80.0 - 100.0 fL Final  . MCH 01/29/2015 31.1  26.0 - 34.0 pg Final  . MCHC 01/29/2015 33.4  32.0 - 36.0 g/dL Final  . RDW 01/29/2015 13.4  11.5 - 14.5 % Final  . Platelets 01/29/2015 288  150 - 440 K/uL Final  . Neutrophils Relative % 01/29/2015 65   Final  . Neutro Abs 01/29/2015 7.5* 1.4 - 6.5 K/uL Final  . Lymphocytes Relative 01/29/2015 24   Final  . Lymphs Abs 01/29/2015 2.8  1.0 - 3.6 K/uL Final  . Monocytes Relative 01/29/2015 9   Final  . Monocytes Absolute 01/29/2015 1.1* 0.2 - 1.0 K/uL Final  . Eosinophils Relative 01/29/2015 1   Final  . Eosinophils Absolute 01/29/2015 0.1  0 - 0.7 K/uL Final  . Basophils Relative 01/29/2015 1   Final  . Basophils Absolute 01/29/2015 0.1  0 - 0.1 K/uL Final  . Sodium 01/29/2015 136  135 - 145 mmol/L Final  . Potassium 01/29/2015 4.3  3.5 - 5.1 mmol/L Final  . Chloride 01/29/2015 105  101 - 111 mmol/L Final  . CO2 01/29/2015 24  22 - 32 mmol/L Final  . Glucose, Bld 01/29/2015 123* 65  - 99 mg/dL Final  . BUN 01/29/2015 21* 6 - 20 mg/dL Final  . Creatinine, Ser 01/29/2015 1.04  0.61 - 1.24 mg/dL Final  . Calcium 01/29/2015 9.6  8.9 - 10.3 mg/dL Final  . Total Protein 01/29/2015 7.1  6.5 - 8.1 g/dL Final  . Albumin 01/29/2015 4.9  3.5 - 5.0 g/dL Final  . AST 01/29/2015 24  15 - 41 U/L Final  . ALT 01/29/2015 7* 17 - 63 U/L Final  . Alkaline Phosphatase 01/29/2015 99  38 - 126 U/L Final  . Total Bilirubin 01/29/2015 0.8  0.3 - 1.2 mg/dL Final  . GFR calc non Af Amer 01/29/2015 >60  >60 mL/min Final  . GFR calc Af Amer 01/29/2015 >60  >60 mL/min  Final   Comment: (NOTE) The eGFR has been calculated using the CKD EPI equation. This calculation has not been validated in all clinical situations. eGFR's persistently <60 mL/min signify possible Chronic Kidney Disease.   . Anion gap 01/29/2015 7  5 - 15 Final  . LDH 01/29/2015 141  98 - 192 U/L Final      STUDIES:No results found for: PSA ASSESSMENT: Marginal zone lymphoma of the spleen status postsplenectomy Recent diagnosis of Parkinson's disease MRI and PET scan has been reviewed independently  MEDICAL DECISION MAKING:  Lab data has been reviewed MRI scan of November 2 016 shows normal MRI scan of brain enlarged lymph node within the right parotid gland.  Which are new. PET scan shows hypermetabolic intraparotid lymphadenopathy  In view of this is previous history of lymphoma and the fact that if he wanted to biopsy this lymph node patient may need extensive parotid surgery will try to talk to interventional radiologist to decide whether core biopsy can be done to rule out any other malignancy. His has been discussed with the patient and family Clinically I do not feel any enlarged lymphadenopathy anywhere else  Patient expressed understanding and was in agreement with this plan. He also understands that He can call clinic at any time with any questions, concerns, or complaints.    No matching staging information  was found for the patient.  Forest Gleason, MD   01/29/2015 12:04 PM

## 2015-02-05 ENCOUNTER — Ambulatory Visit
Admission: RE | Admit: 2015-02-05 | Discharge: 2015-02-05 | Disposition: A | Discharge status: Home / Self Care | Payer: Medicare Other | Source: Ambulatory Visit | Attending: Oncology | Admitting: Oncology

## 2015-02-05 ENCOUNTER — Other Ambulatory Visit: Payer: Self-pay | Admitting: Oncology

## 2015-02-05 DIAGNOSIS — E214 Other specified disorders of parathyroid gland: Secondary | ICD-10-CM | POA: Insufficient documentation

## 2015-02-05 DIAGNOSIS — R220 Localized swelling, mass and lump, head: Secondary | ICD-10-CM | POA: Insufficient documentation

## 2015-02-05 DIAGNOSIS — C8307 Small cell B-cell lymphoma, spleen: Secondary | ICD-10-CM

## 2015-02-05 DIAGNOSIS — R599 Enlarged lymph nodes, unspecified: Secondary | ICD-10-CM | POA: Insufficient documentation

## 2015-02-05 HISTORY — DX: Essential (primary) hypertension: I10

## 2015-02-05 MED ORDER — IBUPROFEN 200 MG PO TABS
600.0000 mg | ORAL_TABLET | ORAL | Status: DC | PRN
  Filled 2015-02-05: qty 3

## 2015-02-05 NOTE — Progress Notes (Signed)
Pt doing well post procedure, ready for discharge, instructions given per Lily Lovings, with questions answered, dressing c/d/i

## 2015-02-05 NOTE — Procedures (Signed)
US guided core biopsy of right parotid gland  Complications:  None  Blood Loss: none  See dictation in canopy pacs

## 2015-02-05 NOTE — OR Nursing (Signed)
Per Dr Liborio Nixon start IV access, no need for fluids at this time

## 2015-02-08 ENCOUNTER — Encounter: Payer: Self-pay | Admitting: Oncology

## 2015-02-08 ENCOUNTER — Inpatient Hospital Stay: Payer: Medicare Other | Admitting: Oncology

## 2015-02-08 VITALS — BP 165/97 | HR 18 | Temp 95.9°F | Resp 18 | Wt 225.5 lb

## 2015-02-08 DIAGNOSIS — C8307 Small cell B-cell lymphoma, spleen: Secondary | ICD-10-CM

## 2015-02-08 NOTE — Progress Notes (Signed)
Patient here today for bx and [place] results.

## 2015-02-08 NOTE — Progress Notes (Signed)
Biopsy report was not available so patient was not examined

## 2015-02-20 ENCOUNTER — Other Ambulatory Visit: Payer: Self-pay | Admitting: Family Medicine

## 2015-02-20 ENCOUNTER — Telehealth: Payer: Self-pay | Admitting: *Deleted

## 2015-02-20 LAB — SURGICAL PATHOLOGY

## 2015-02-20 NOTE — Telephone Encounter (Signed)
Agrees to appt on 1/3 @ 1130

## 2015-02-20 NOTE — Telephone Encounter (Signed)
Left VM to return my call

## 2015-02-20 NOTE — Telephone Encounter (Signed)
Per leslie, pt has no malignant cells in recent biopsy but shows reactive lymphoid tissue. MD can see pt on 1/3 to discuss results further. Please arrange for appt.

## 2015-02-20 NOTE — Telephone Encounter (Signed)
Asking that we call her with biopsy results

## 2015-02-27 ENCOUNTER — Ambulatory Visit: Payer: Medicare Other | Admitting: Occupational Therapy

## 2015-02-27 ENCOUNTER — Encounter: Payer: Self-pay | Admitting: Occupational Therapy

## 2015-02-27 ENCOUNTER — Ambulatory Visit: Payer: Medicare Other | Attending: Neurology | Admitting: Speech Pathology

## 2015-02-27 ENCOUNTER — Inpatient Hospital Stay: Payer: Medicare Other | Attending: Oncology | Admitting: Oncology

## 2015-02-27 ENCOUNTER — Encounter: Payer: Self-pay | Admitting: Speech Pathology

## 2015-02-27 VITALS — BP 159/84 | HR 74 | Temp 96.9°F | Wt 225.3 lb

## 2015-02-27 VITALS — BP 158/91

## 2015-02-27 DIAGNOSIS — R460 Very low level of personal hygiene: Secondary | ICD-10-CM

## 2015-02-27 DIAGNOSIS — D473 Essential (hemorrhagic) thrombocythemia: Secondary | ICD-10-CM | POA: Diagnosis not present

## 2015-02-27 DIAGNOSIS — Z8546 Personal history of malignant neoplasm of prostate: Secondary | ICD-10-CM | POA: Insufficient documentation

## 2015-02-27 DIAGNOSIS — R5383 Other fatigue: Secondary | ICD-10-CM | POA: Diagnosis not present

## 2015-02-27 DIAGNOSIS — Z7982 Long term (current) use of aspirin: Secondary | ICD-10-CM | POA: Diagnosis not present

## 2015-02-27 DIAGNOSIS — Z9079 Acquired absence of other genital organ(s): Secondary | ICD-10-CM

## 2015-02-27 DIAGNOSIS — Z9081 Acquired absence of spleen: Secondary | ICD-10-CM | POA: Diagnosis not present

## 2015-02-27 DIAGNOSIS — Z79899 Other long term (current) drug therapy: Secondary | ICD-10-CM | POA: Diagnosis not present

## 2015-02-27 DIAGNOSIS — C8307 Small cell B-cell lymphoma, spleen: Secondary | ICD-10-CM | POA: Diagnosis not present

## 2015-02-27 DIAGNOSIS — R599 Enlarged lymph nodes, unspecified: Secondary | ICD-10-CM | POA: Diagnosis not present

## 2015-02-27 DIAGNOSIS — I10 Essential (primary) hypertension: Secondary | ICD-10-CM | POA: Insufficient documentation

## 2015-02-27 DIAGNOSIS — G20 Parkinson's disease: Secondary | ICD-10-CM | POA: Diagnosis not present

## 2015-02-27 DIAGNOSIS — R531 Weakness: Secondary | ICD-10-CM

## 2015-02-27 DIAGNOSIS — F430 Acute stress reaction: Secondary | ICD-10-CM

## 2015-02-27 DIAGNOSIS — R279 Unspecified lack of coordination: Secondary | ICD-10-CM

## 2015-02-27 DIAGNOSIS — M6281 Muscle weakness (generalized): Secondary | ICD-10-CM | POA: Diagnosis present

## 2015-02-27 DIAGNOSIS — R262 Difficulty in walking, not elsewhere classified: Secondary | ICD-10-CM | POA: Insufficient documentation

## 2015-02-27 DIAGNOSIS — R490 Dysphonia: Secondary | ICD-10-CM | POA: Insufficient documentation

## 2015-02-27 DIAGNOSIS — Z741 Need for assistance with personal care: Secondary | ICD-10-CM

## 2015-02-27 DIAGNOSIS — R2681 Unsteadiness on feet: Secondary | ICD-10-CM

## 2015-02-27 NOTE — Therapy (Signed)
[place] MAIN [facility] SERVICES [address] [place], [place], [address] Phone: [phone]   Fax:  [phone]  Speech Language Pathology Evaluation  Patient Details  Name: Chad Sandoval MRN: [id number] Date of Birth: 01/21/1946 Referring Provider: Dr. 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  Visit Diagnosis: Dysphonia - Plan: SLP plan of care cert/re-cert      Subjective Assessment - 02/27/15 1022    Subjective The patient reports that speech/voice changes due to Parkinson's; changes include hypophonia, hoarse vocal quality, monotone speech, and decreased facial expressions.  There are no reported swallowing problems.   Patient is accompained by: Family member   Currently in Pain? No/denies            SLP Evaluation [facility] - 02/27/15 1017    SLP Visit Information   SLP Received On 02/27/15   Referring Provider Dr. Manuella Ghazi   Onset Date 12/21/2014   Medical Diagnosis Parkinson's disease   Subjective   Subjective The patient reports that speech/voice changes due to Parkinson's; changes include hypophonia, hoarse vocal quality, monotone speech, and decreased facial expressions.  There are no reported swallowing  problems.   Patient/Family Stated Goal Normalize facial expression   Pain Assessment   Currently in Pain? 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   Oral Motor/Sensory Function   Overall Oral Motor/Sensory Function Appears within functional limits for tasks assessed   Motor Speech   Overall Motor Speech Impaired   Respiration Impaired   Level of Impairment Conversation   Phonation Hoarse;Low vocal intensity   Resonance Within functional limits   Articulation Within functional limitis   Intelligibility Intelligible   Motor Planning Witnin functional limits   Motor Speech Errors Not applicable   Standardized Assessments   Standardized Assessments  Other Assessment  LSVT-LOUD Voice Assesment       LSVT-LOUD Voice Evaluation  Voice history: The patient reports that speech/voice changes due to Parkinsons; changes include hypophonia, hoarse vocal quality, monotone speech, and decreased facial expressions.  There are no reported swallowing problems.  Maximum phonation time for sustained ah: 13 seconds  Mean intensity during sustained ah: 67 dB   Mean intensity sustained during conversational speech: 63 dB  Average fundamental frequency during sustained ah: 119 Hz (1.1 STD below mean for age and gender)  Highest dynamic pitch when altering pitch from a low note to a high note: 308 Hz  Highest pitch during conversational speech: 232 Hz  Lowest dynamic pitch when altering from a high note to a low note: 62 Hz  Lowest pitch during conversational speech: 70 Hz  Visi-Pitch: Multi-Dimensional Voice Program (MDVP)  MDVP extracts objective quantitative values (Relative Average Perturbation, Shimmer, Voice Turbulence Index, and Noise to Harmonic Ratio) on sustained phonation, which are displayed graphically and numerically in comparison to a built-in  normative database.  The patient exhibited values outside the norm for Relative Average Perturbation, Shimmer, and Voice Turbulence Index.  Average fundamental frequency was 1.1 STD below the average for age and gender. The patient improved all parameters when cued to alter voicing (loud like me).   Stimulability: Improved vocal quality and facial expression with loud voice.  Daily Task #1: Average 19 seconds, 82 dB. Daily Task 2: Highs: 15 high pitched "ah" given mod-max cues. Lows: 15 low pitched "ah" given mod-max cues. Daily task #3: Average 72 dB.  Hierarchal speech loudness drill: Read sentences, 73 dB. Homework: assignments given.  Off the cuff remarks: average 68 dB.         SLP Education - 03-02-15 1022    Education provided Yes   Education Details LSVT-LOUD protocol    Person(s) Educated Patient;Spouse   Methods Explanation;Handout   Comprehension Verbalized understanding            SLP Long Term Goals - 03/02/15 1025    SLP LONG TERM GOAL #1   Title The patient will complete Daily Tasks (Maximum duration "ah", High/Lows, and Functional Phrases) at average loudness of 80 dB and with loud, good quality voice.    Time 4   Period Weeks   Status New   SLP LONG TERM GOAL #2   Title The patient will complete Hierarchal Speech Loudness reading drills (words/phrases, sentences, and paragraph) at average 75 dB and with loud, good quality voice.     Time 4   Period Weeks   Status New   SLP LONG TERM GOAL #3   Title The patient will complete homework daily.   Time 4   Period Weeks   Status New   SLP LONG TERM GOAL #4   Title The patient will participate in conversation, maintaining average loudness of 75 dB and loud, good quality voice.   Time 4   Period Weeks   Status New          Plan - March 02, 2015 1024    Clinical Impression Statement This 69 year old man with diagnosed Parkinson' disease is presenting with moderate voice disorder characterized by hoarse vocal  quality, hypophonia, monotone voice, and reduced facial expression.  Based on stimulability testing, the patient is judged to be a good candidate for the LSVT LOUD program.  It is recommended that the patient receive the LSVT LOUD program which is comprised of 16 intensive sessions (4 times per week for 4 weeks, one hour sessions).  Prognosis for improvement is good based on his motivation, stimulability, and strong family support.  LSVT LOUD has been documented in the literature as efficacious for individuals with Parkinson's disease.     Speech Therapy Frequency 4x / week   Duration 4 weeks   Treatment/Interventions Other (comment)  LSVT-LOUD   Potential to Achieve Goals Good   Potential Considerations Ability to learn/carryover information;Cooperation/participation level;Previous level of function;Severity of impairments;Family/community support   Consulted and Agree with Plan of Care Patient;Family member/caregiver   Family Member Consulted Spouse          G-Codes - 03-02-15 1027    Functional Assessment  Tool Used LSVT-LOUD voice evaluation protocol   Functional Limitations Voice   Voice Current Status [phone]) At least 40 percent but less than 60 percent impaired, limited or restricted   Voice Goal Status [id number]) At least 1 percent but less than 20 percent impaired, limited or restricted      Problem List Patient Active Problem List   Diagnosis Date Noted   Essential (primary) hypertension 01/29/2015   Class 1 obesity 01/29/2015   H/O malignant neoplasm of prostate 12/15/2014   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[place]) 07/28/2014   Diabetes mellitus, type 2 ([place]) 02/07/2014   Type 2 diabetes mellitus ([place]) 02/07/2014   Bladder neck obstruction 01/02/2014   Bladder retention 01/02/2014   Abnormal prostate specific antigen 09/29/2011   Elevated prostate specific antigen (PSA) 09/29/2011   CA of prostate ([place]) 04/07/2011   ED (erectile dysfunction) of organic origin  04/07/2011   Malignant neoplasm of prostate ([place]) 04/07/2011   [name], MS/CCC- SLP  Lou Miner 02/27/2015, 10:30 AM  [place] [address] [place], [place], [address] Phone: [phone]   Fax:  [phone]  Name: Chad Sandoval MRN: [id number] Date of Birth: May 02, 1945

## 2015-02-28 ENCOUNTER — Ambulatory Visit: Payer: Medicare Other | Admitting: Occupational Therapy

## 2015-02-28 ENCOUNTER — Ambulatory Visit: Payer: Medicare Other | Admitting: Speech Pathology

## 2015-02-28 ENCOUNTER — Encounter: Payer: Self-pay | Admitting: Speech Pathology

## 2015-02-28 DIAGNOSIS — Z741 Need for assistance with personal care: Secondary | ICD-10-CM

## 2015-02-28 DIAGNOSIS — R2681 Unsteadiness on feet: Secondary | ICD-10-CM

## 2015-02-28 DIAGNOSIS — R262 Difficulty in walking, not elsewhere classified: Secondary | ICD-10-CM

## 2015-02-28 DIAGNOSIS — R460 Very low level of personal hygiene: Secondary | ICD-10-CM

## 2015-02-28 DIAGNOSIS — R279 Unspecified lack of coordination: Secondary | ICD-10-CM

## 2015-02-28 DIAGNOSIS — R490 Dysphonia: Secondary | ICD-10-CM

## 2015-02-28 DIAGNOSIS — M6281 Muscle weakness (generalized): Secondary | ICD-10-CM

## 2015-02-28 NOTE — Therapy (Signed)
[place] MAIN [facility] SERVICES [address] [place], [place], [address] Phone: [phone]   Fax:  [phone]  Occupational Therapy Evaluation  Patient Details  Name: Chad Sandoval MRN: [id number] Date of Birth: [birth year] Referring Provider: Manuella Ghazi  Encounter Date: 02/27/2015      OT End of Session - 02/27/15 2053    Visit Number 1   Number of Visits 17   Date for OT Re-Evaluation 03/27/15   Authorization Type medicare G codes 1   OT Start Time 1001   OT Stop Time 1100   OT Time Calculation (min) 59 min   Activity Tolerance Patient tolerated treatment well   Behavior During Therapy [facility] for tasks assessed/performed      Past Medical History  Diagnosis Date  . Prostate cancer [facility]) 2003    s/p radical prostatectomy  . Thrombocytopenia ([place])   . History of radiation therapy 11/03/13- 12/19/13    prostate bed 6600 cGy 33 sessions  . 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 Hypertension     Past Surgical History  Procedure Laterality Date  . Retropubic prostatectomy  03/02/2001    Gleason 7  . Splenectomy  04/2012    [place]:   02/27/15 2031  BP: 158/91    Visit Diagnosis:  Difficulty walking - Plan: Ot plan of care cert/re-cert  Lack of coordination - Plan: Ot plan of care cert/re-cert  Muscle weakness (generalized) - Plan: Ot plan of care cert/re-cert  Self-care deficit for dressing and grooming - Plan: Ot plan of care cert/re-cert  Unsteady gait - Plan: Ot plan of care cert/re-cert      Subjective Assessment - 02/27/15 2031    Subjective  Patient reports he was recently diagnosed with Parkinson's disease in December 2016.  Denies any pain, wife present for evaluation.   Pertinent History Patient reports he was recently diagnosed with Parkinson's disease in December 2016.  He reports noticing decreased ability to play golf, his chipping and putting worsened in the last few months.   He recognized decreased coordination skills, right hand tremors and decreased legibility with handwriting.  He reports "scuffing his foot" at times.  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     Patient Stated Goals Patient reports he wants to remain independent with his daily tasks, continue to work part time, wants his walking to be improved and get his golf game back again.   Currently in Pain? No/denies   Pain Score 0-No pain           [facility] OT Assessment - 02/27/15 2111    Assessment   Diagnosis Parkinson's disease   Onset Date 01/25/15   Prior Therapy none   Precautions   Precautions None   Balance Screen   Has the patient fallen in the past 6 months No   Has the patient had a decrease in activity level because of a fear of falling?  No   Is the patient reluctant to leave their home because of a fear of falling?  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   ADL   Eating/Feeding Modified independent   Grooming Independent   Teacher, early years/pre Increased time   Environmental education officer Needs assist for fasteners;Increased time   Lower Body Dressing Increased time   Toilet Tranfer Independent   Toileting - Water engineer -  Hygiene Independent   Tub/Shower Transfer Modified independent   IADL   Prior Level of Function Shopping independent   Shopping Takes care of  all shopping needs independently   Prior Level of Function Light Housekeeping independent   Light Housekeeping Does personal laundry completely   Prior Level of Function Meal Prep independent   Meal Prep Plans, prepares and serves adequate meals independently   Programmer, applications own vehicle   Medication Management Is responsible for taking medication in correct dosages at correct time   Mobility   Mobility Status Independent   Written Expression   Handwriting Mild micrographia;Increased time;50% legible   Cognition   Overall Cognitive Status Within Functional Limits for tasks assessed   Attention Focused   Memory Appears intact   Sensation   Light Touch Appears Intact   Coordination   Gross Motor Movements are Fluid and Coordinated No   Fine Motor Movements are Fluid and Coordinated No   Coordination and Movement Description mild tremors in right hand at times but has improved with medication over the last few weeks.   Finger Nose Finger Test mild incoordination noted on right   9 Hole Peg Test Right;Left   Right 9 Hole Peg Test 26   Left 9 Hole Peg Test 21   AROM   Overall AROM  Within functional limits for tasks performed   Overall AROM Comments BUE ROM WFLs, decreased trunk rotation more on right than left side.   Strength   Overall Strength Within functional limits for tasks performed   Overall Strength Comments RUE shoulder 4/5, elbow 5/5, LUE 5/5 overall.   Hand Function   Right Hand Grip (lbs) 80   Right Hand Lateral Pinch 21 lbs   Right Hand 3 Point Pinch 20 lbs   Left Hand Grip (lbs) 94   Left Hand Lateral Pinch 21 lbs   Left 3 point pinch 21 lbs   Sensation Exercises   Stereognosis intact   RLE Tone   RLE Tone Hypertonic   LLE Tone   LLE Tone Hypertonic        6 minute walk test 1620 feet 5 times sit to stand 12 secs Freezing of gait-patient denies any issues.                  OT Education - 02/27/15 2053    Education provided Yes    Education Details LSVT BIG, role of OT   Person(s) Educated Patient;Spouse   Methods Explanation   Comprehension Verbalized understanding                    Plan - 02/27/15 2054    Clinical Impression Statement Patient is a 70 yo male diagnosed recently with Parkinson's disease and was referred by his physician for LSVT BIG program. Patient presents with mild decreased step length with gait patterns, decreased reciprocal arm swing especially on the RUE ,  decreased balance, decreased coordination UE/LE, mild tremors on RUE and decreased muscle strength which affect his ability to perform daily tasks including self care and work. The patient is judged to be an excellent candidate for the LSVT BIG program. He would benefit from and was referred for the LSVT BIG program which is an intensive program designed specifically for Parkinson's patients with a focus on increasing amplitude and speed of movements, improving self-care and daily tasks and providing patients with daily exercises to improve overall function. It is recommended that the patient receive the LSVT BIG program which is comprised of 16 intensive sessions (4 times per week for 4 weeks, one hour sessions). Prognosis for improvement is good based on his motivation and strong family support. LSVT BIG has been documented in the literature as efficacious for individuals with Parkinson's disease.    Pt will benefit from skilled therapeutic intervention in order to improve on the following deficits (Retired) Impaired flexibility;Decreased coordination;Decreased mobility;Decreased endurance;Decreased range of motion;Decreased strength;Decreased balance;Difficulty walking;Impaired UE functional use   Rehab Potential Excellent   OT Frequency 4x / week   OT Duration 4 weeks   OT Treatment/Interventions Self-care/ADL training;Therapeutic exercise;Gait Training;Neuromuscular education;Stair Training;Functional Mobility  Training;Patient/family education;Therapeutic activities;Balance training   Plan Participation in intensive LSVT BIG program 4 times a week for 4 weeks, 16 treatment sessions in addition to evaluation.   Consulted and Agree with Plan of Care Patient;Family member/caregiver   Family Member Consulted wife, Lavetta Nielsen - March 27, 2015 06/22/2057    Functional Assessment Tool Used clinical judgment, 6 minute walk test, 5 times sit to stand, freezing of gait questionairre, self care assessment, strength testing   Functional Limitation Mobility: Walking and moving around   Mobility: Walking and Moving Around Current Status [phone]) At least 20 percent but less than 40 percent impaired, limited or restricted   Mobility: Walking and Moving Around Goal Status [phone]) At least 1 percent but less than 20 percent impaired, limited or restricted      Problem List Patient Active Problem List   Diagnosis Date Noted  . Essential (primary) hypertension 01/29/2015  . Class 1 obesity 01/29/2015  . H/O malignant neoplasm of prostate 12/15/2014  . 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 Diabetes mellitus, type 2 ([place]) 02/07/2014  . Type 2 diabetes mellitus ([place]) 02/07/2014  . Bladder neck obstruction 01/02/2014  . Bladder retention 01/02/2014  . Abnormal prostate specific antigen 09/29/2011  . Elevated prostate specific antigen (PSA) 09/29/2011  . CA of prostate ([place]) 04/07/2011  . 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 Malignant neoplasm of prostate ([place]) 04/07/2011   Amy T Lovett, OTR/L, CLT Lovett,Amy 2015-03-27, 9:11 PM  [place] MAIN [facility] SERVICES [address] [place], [place], [address] Phone: [phone]   Fax:  [phone]  Name: Chad Sandoval MRN: [id number] Date of Birth: 17-Feb-1946

## 2015-02-28 NOTE — Therapy (Signed)
[place] MAIN [facility] SERVICES [address] [place], [place], [address] Phone: [phone]   Fax:  [phone]  Speech Language Pathology Treatment  Patient Details  Name: Chad Sandoval MRN: [id number] Date of Birth: 09-27-1945 Referring Provider: Dr. Manuella Ghazi  Encounter Date: 02/28/2015      End of Session - 02/28/15 1215    Visit Number 2   Number of Visits 17   Date for SLP Re-Evaluation 03/30/15   SLP Start Time 1000   SLP Stop Time  1050   SLP Time Calculation (min) 50 min   Activity Tolerance Patient tolerated treatment well      Past Medical History  Diagnosis Date  . Prostate cancer [facility]) 2003    s/p radical prostatectomy  . Thrombocytopenia ([place])   . History of radiation therapy 11/03/13- 12/19/13    prostate bed 6600 cGy 33 sessions  . 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 Hypertension     Past Surgical History  Procedure Laterality Date  . Retropubic prostatectomy  03/02/2001    Gleason 7  . 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  Visit Diagnosis: Dysphonia      Subjective Assessment - 02/28/15 1214    Subjective The patient had questions RE: homework   Currently in Pain? No/denies           SLP Evaluation [facility] - 02/27/15 1017    SLP Visit Information   SLP Received On 02/27/15   Referring Provider Dr. Manuella Ghazi   Onset Date 12/21/2014   Medical Diagnosis Parkinson's disease   Subjective   Subjective The patient reports that speech/voice changes due to Parkinson's; changes include hypophonia, hoarse vocal quality, monotone speech, and decreased facial expressions.  There are no reported swallowing problems.   Patient/Family Stated Goal Normalize facial expression   Pain Assessment   Currently in Pain? 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   Oral Motor/Sensory Function   Overall Oral Motor/Sensory Function Appears within functional limits for tasks assessed   Motor Speech   Overall Motor Speech Impaired   Respiration Impaired   Level of Impairment Conversation   Phonation Hoarse;Low vocal intensity   Resonance Within functional limits   Articulation Within functional limitis   Intelligibility Intelligible   Motor Planning Witnin functional limits   Motor Speech Errors Not applicable   Standardized Assessments   Standardized Assessments  Other Assessment  LSVT-LOUD Voice Assesment            ADULT SLP TREATMENT - 02/28/15 0001    General Information   Behavior/Cognition Alert;Cooperative;Pleasant mood   HPI Parkinson's disease   Treatment Provided   Treatment provided Cognitive-Linquistic   Pain Assessment   Pain Assessment No/denies pain   Cognitive-Linquistic Treatment   Treatment focused on Voice   Skilled Treatment Daily Task #1: Average 19 seconds, 85 dB. Daily Task 2: Highs: 15 high pitched "ah" given min cues. Lows: 15 low pitched "ah" given mod-max cues. Daily task #3: Average 75 dB.  Hierarchal speech loudness drill: Read sentences, 75 dB. Read 2-3 sentences, 72 dB.  Homework: assignments completed.  Off the cuff remarks: average 68 dB.   Assessment / Recommendations / Plan   Plan Continue with current plan of care   Progression Toward Goals  Progression toward goals Progressing toward goals          SLP Education - 02/28/15 1215    Education provided Yes   Education Details LSVT-LOUD   Person(s) Educated Patient   Methods Explanation;Demonstration;Verbal cues;Handout   Comprehension Verbalized understanding;Returned demonstration;Verbal cues required;Need further instruction            SLP Long Term Goals - 03-23-15 1025    SLP LONG TERM GOAL #1   Title The patient will complete Daily Tasks (Maximum duration "ah", High/Lows, and Functional Phrases) at  average loudness of 80 dB and with loud, good quality voice.    Time 4   Period Weeks   Status New   SLP LONG TERM GOAL #2   Title The patient will complete Hierarchal Speech Loudness reading drills (words/phrases, sentences, and paragraph) at average 75 dB and with loud, good quality voice.     Time 4   Period Weeks   Status New   SLP LONG TERM GOAL #3   Title The patient will complete homework daily.   Time 4   Period Weeks   Status New   SLP LONG TERM GOAL #4   Title The patient will participate in conversation, maintaining average loudness of 75 dB and loud, good quality voice.   Time 4   Period Weeks   Status New          Plan - 02/28/15 1216    Clinical Impression Statement The patient is completing daily tasks and hierarchal speech drill tasks with loud, good quality voice given min SLP cues.  The patient is exhibiting increased facial gestures.   Speech Therapy Frequency 4x / week   Duration 4 weeks   Treatment/Interventions Other (comment)  LSVT-LOUD   Potential to Achieve Goals Good   Potential Considerations Ability to learn/carryover information;Cooperation/participation level;Previous level of function;Severity of impairments;Family/community support   Consulted and Agree with Plan of Care Patient          G-Codes - 03/23/2015 1027    Functional Assessment Tool Used LSVT-LOUD voice evaluation protocol   Functional Limitations Voice   Voice Current Status [phone]) At least 40 percent but less than 60 percent impaired, limited or restricted   Voice Goal Status [id number]) At least 1 percent but less than 20 percent impaired, limited or restricted      Problem List Patient Active Problem List   Diagnosis Date Noted  . Essential (primary) hypertension 01/29/2015  . Class 1 obesity 01/29/2015  . H/O malignant neoplasm of prostate 12/15/2014  . 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 Diabetes mellitus, type 2 ([place]) 02/07/2014  . Type 2 diabetes  mellitus ([place]) 02/07/2014  . Bladder neck obstruction 01/02/2014  . Bladder retention 01/02/2014  . Abnormal prostate specific antigen 09/29/2011  . Elevated prostate specific antigen (PSA) 09/29/2011  . CA of prostate ([place]) 04/07/2011  . 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 Malignant neoplasm of prostate ([place]) 04/07/2011   [name], MS/CCC- SLP  Lou Miner 02/28/2015, 12:17 PM  [place] MAIN [facility] SERVICES [address] [place], [place], [address] Phone: [phone]   Fax:  [phone]   Name: PRUITT ZARETSKY MRN: [id number] Date of Birth: [birth year]

## 2015-03-01 ENCOUNTER — Ambulatory Visit: Payer: Medicare Other | Admitting: Speech Pathology

## 2015-03-01 ENCOUNTER — Encounter: Payer: Self-pay | Admitting: Speech Pathology

## 2015-03-01 ENCOUNTER — Ambulatory Visit: Payer: Medicare Other | Admitting: Occupational Therapy

## 2015-03-01 DIAGNOSIS — R2681 Unsteadiness on feet: Secondary | ICD-10-CM

## 2015-03-01 DIAGNOSIS — M6281 Muscle weakness (generalized): Secondary | ICD-10-CM

## 2015-03-01 DIAGNOSIS — R460 Very low level of personal hygiene: Secondary | ICD-10-CM

## 2015-03-01 DIAGNOSIS — R490 Dysphonia: Secondary | ICD-10-CM

## 2015-03-01 DIAGNOSIS — R279 Unspecified lack of coordination: Secondary | ICD-10-CM

## 2015-03-01 DIAGNOSIS — R262 Difficulty in walking, not elsewhere classified: Secondary | ICD-10-CM

## 2015-03-01 DIAGNOSIS — Z741 Need for assistance with personal care: Secondary | ICD-10-CM

## 2015-03-01 NOTE — Therapy (Signed)
[place] MAIN [facility] SERVICES [address] [place], [place], [address] Phone: [phone]   Fax:  [phone]  Occupational Therapy Treatment  Patient Details  Name: Chad Sandoval MRN: [id number] Date of Birth: 01/24/1946 Referring Provider: Manuella Ghazi  Encounter Date: 02/28/2015      OT End of Session - 02/28/15 2140    Visit Number 2   Number of Visits 17   Date for OT Re-Evaluation 03/27/15   Authorization Type medicare G codes 2   OT Start Time 1105   OT Stop Time 1200   OT Time Calculation (min) 55 min   Activity Tolerance Patient tolerated treatment well   Behavior During Therapy [facility] for tasks assessed/performed      Past Medical History  Diagnosis Date  . Prostate cancer [facility]) 2003    s/p radical prostatectomy  . Thrombocytopenia ([place])   . History of radiation therapy 11/03/13- 12/19/13    prostate bed 6600 cGy 33 sessions  . 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 Hypertension     Past Surgical History  Procedure Laterality Date  . Retropubic prostatectomy  03/02/2001    Gleason 7  . 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  Visit Diagnosis:  Difficulty walking  Lack of coordination  Muscle weakness (generalized)  Self-care deficit for dressing and grooming  Unsteady gait      Subjective Assessment - 02/28/15 2139    Subjective  Patient reports he feels good and does not have any pain this date, ready to get started with exercises.   Pertinent History Patient reports he was recently diagnosed with Parkinson's disease in December 2016.  He reports noticing decreased ability to play golf, his chipping and putting worsened in the last few months.  He recognized decreased coordination skills, right hand tremors and decreased legibility with handwriting.  He reports "scuffing his foot" at times.  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     Patient Stated Goals Patient reports he wants to remain independent with his daily tasks, continue to work part time, wants his walking to be improved and get his golf game back again.   Currently in Pain? No/denies   Pain Score 0-No pain                      OT Treatments/Exercises (OP) - 03/01/15 2136    Neurological Re-education Exercises   Other Exercises 1 Patient seen for initial instruction of LSVT BIG exercises: LSVT Daily Session Maximal Daily Exercises: Sustained movements are designed to rescale the amplitude of movement output for generalization to daily functional activities. Performed as follows for 1 set of 10 repetitions each: Multi directional sustained movements- 1) Floor to ceiling, 2) Side to side. Multi directional Repetitive movements performed in standing and are designed to provide retraining effort needed for sustained muscle activation in tasks Performed as follows: 3) Step and reach forward, 4) Step and Reach Backwards, 5) Step and reach sideways, 6) Rock and reach forward/backward, 7) Rock and reach sideways. Sit to stand from mat table on lowest setting with cues for weight shift, technique and CGA for 5 reps for 2 sets. Patient seen for functional mobility tasks this date with emphasis on gait speed, length of steps with short distance ambulation. Patient requires cues for BIG movements and  cadence.All standing exercises performed this date with CGA, verbal and tactile cues as needed.                OT Education - 03/03/15 30-May-2138    Education provided Yes   Education Details LSVT BIG maximal daily exercises   Person(s) Educated Patient   Methods Explanation;Demonstration;Verbal cues   Comprehension Verbal cues required;Returned demonstration;Verbalized understanding             OT Long Term Goals - 03/01/15 2145    OT LONG TERM GOAL #1   Title Patient will improve gait speed and endurance  and be able to walk 1750 feet in 6 minutes to negotiate around the home and community safely in 4 weeks.   Time 4   Period Weeks   Status New   OT LONG TERM GOAL #2   Title Patient will complete HEP for maximal daily exercises with modified independence in 4 weeks   Baseline no current program   Time 4   Period Weeks   Status New   OT LONG TERM GOAL #3   Title Patient will transfer from sit to stand without the use of arms safely and independently from a variety of chairs/surfaces in 4 weeks.    Baseline difficulty from low surfaces   Time 4   Status New   OT LONG TERM GOAL #4   Title Patient will be modified independent with home and work tasks including squatting to pick up items from the floor.   Time 4   Period Weeks   Status New   OT LONG TERM GOAL #5   Title Patient will demonstrate 90%legibility with handwriting for name and address with use of normal sized letters.   Time 4   Period Weeks   Status New               Plan - 03-03-15 05-30-39    Clinical Impression Statement Patient seen for initial instruction on LSVT BIG maximal daily exercises this session, required moderate cues and CGA for all exercises performed in standing for balance. Patient will benefit from focus on balance and amplitude of gait in the next sessions to improve these areas for work and home tasks.   Pt will benefit from skilled therapeutic intervention in order to improve on the following deficits (Retired) Impaired flexibility;Decreased coordination;Decreased mobility;Decreased endurance;Decreased range of motion;Decreased strength;Decreased balance;Difficulty walking;Impaired UE functional use   Rehab Potential Excellent   OT Frequency 4x / week   OT Duration 4 weeks   OT Treatment/Interventions Self-care/ADL training;Therapeutic exercise;Gait Training;Neuromuscular education;Stair Training;Functional Mobility Training;Patient/family education;Therapeutic activities;Balance training    Consulted and Agree with Plan of Care Patient          G-Codes - 03-03-15 05-29-57    Functional Assessment Tool Used clinical judgment, 6 minute walk test, 5 times sit to stand, freezing of gait questionairre, self care assessment, strength testing   Functional Limitation Mobility: Walking and moving around   Mobility: Walking and Moving Around Current Status [phone]) At least 20 percent but less than 40 percent impaired, limited or restricted   Mobility: Walking and Moving Around Goal Status [phone]) At least 1 percent but less than 20 percent impaired, limited or restricted      Problem List Patient Active Problem List   Diagnosis Date Noted  . Essential (primary) hypertension 01/29/2015  . Class 1 obesity 01/29/2015  . H/O malignant neoplasm of prostate 12/15/2014  . 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  Diabetes mellitus, type 2 ([place]) 02/07/2014  . Type 2 diabetes mellitus ([place]) 02/07/2014  . Bladder neck obstruction 01/02/2014  . Bladder retention 01/02/2014  . Abnormal prostate specific antigen 09/29/2011  . Elevated prostate specific antigen (PSA) 09/29/2011  . CA of prostate ([place]) 04/07/2011  . 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 Malignant neoplasm of prostate [facility]) 04/07/2011   Brandyn Lowrey T Noura Purpura, OTR/L, CLT  Jaykob Minichiello 03/01/2015, 9:51 PM  [place] MAIN [facility] SERVICES [address] [place], [place], [address] Phone: [phone]   Fax:  [phone]  Name: Chad Sandoval MRN: [id number] Date of Birth: 1945-07-07

## 2015-03-01 NOTE — Therapy (Signed)
[place] MAIN [facility] SERVICES [address] [place], [place], [address] Phone: [phone]   Fax:  [phone]  Speech Language Pathology Treatment  Patient Details  Name: Chad Sandoval MRN: [id number] Date of Birth: Jul 27, 1945 Referring Provider: Dr. Manuella Ghazi  Encounter Date: 03/01/2015      End of Session - 03/01/15 1058    Visit Number 3   Number of Visits 17   Date for SLP Re-Evaluation 03/30/15   SLP Start Time 1000   SLP Stop Time  1049   SLP Time Calculation (min) 49 min   Activity Tolerance Patient tolerated treatment well      Past Medical History  Diagnosis Date  . Prostate cancer [facility]) 2003    s/p radical prostatectomy  . Thrombocytopenia ([place])   . History of radiation therapy 11/03/13- 12/19/13    prostate bed 6600 cGy 33 sessions  . 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 Hypertension     Past Surgical History  Procedure Laterality Date  . Retropubic prostatectomy  03/02/2001    Gleason 7  . 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  Visit Diagnosis: Dysphonia      Subjective Assessment - 03/01/15 1057    Subjective The patient is suprised that loudness improves his vocal quality   Currently in Pain? No/denies               ADULT SLP TREATMENT - 03/01/15 0001    General Information   Behavior/Cognition Alert;Cooperative;Pleasant mood   HPI Parkinson's disease   Treatment Provided   Treatment provided Cognitive-Linquistic   Pain Assessment   Pain Assessment No/denies pain   Cognitive-Linquistic Treatment   Treatment focused on Voice   Skilled Treatment Daily Task #1: Average 19 seconds, 85 dB. Daily Task 2: Highs: 15 high pitched "ah" given min cues. Lows: 15 low pitched "ah" given min-mod cues. Daily task #3: Average 75 dB.  Hierarchal speech loudness drill: Read 2-3 sentences, 73 dB.  Homework: assignments completed.  Off the cuff remarks:  average 68 dB.   Assessment / Recommendations / Plan   Plan Continue with current plan of care   Progression Toward Goals   Progression toward goals Progressing toward goals          SLP Education - 03/01/15 1058    Education provided Yes   Education Details LSVT-LOUD   Person(s) Educated Patient   Methods Explanation;Demonstration;Verbal cues;Handout   Comprehension Verbalized understanding;Returned demonstration;Verbal cues required;Need further instruction            SLP Long Term Goals - 02/27/15 1025    SLP LONG TERM GOAL #1   Title The patient will complete Daily Tasks (Maximum duration "ah", High/Lows, and Functional Phrases) at average loudness of 80 dB and with loud, good quality voice.    Time 4   Period Weeks   Status New   SLP LONG TERM GOAL #2   Title The patient will complete Hierarchal Speech Loudness reading drills (words/phrases, sentences, and paragraph) at average 75 dB and with loud, good quality voice.     Time 4   Period Weeks   Status New   SLP LONG TERM GOAL #3   Title The patient will complete homework daily.   Time 4   Period Weeks   Status New   SLP LONG TERM GOAL #4   Title The patient will participate in conversation,  maintaining average loudness of 75 dB and loud, good quality voice.   Time 4   Period Weeks   Status New          Plan - 03/01/15 1059    Clinical Impression Statement The patient is completing daily tasks and hierarchal speech drill tasks with loud, good quality voice given min SLP cues.  The patient is exhibiting increased facial gestures.   Speech Therapy Frequency 4x / week   Duration 4 weeks   Treatment/Interventions Other (comment)  LSVT-LOUD   Potential Considerations Ability to learn/carryover information;Cooperation/participation level;Previous level of function;Severity of impairments;Family/community support   SLP Home Exercise Plan LSVT-LOUD daily homework   Consulted and Agree with Plan of Care Patient         Problem List Patient Active Problem List   Diagnosis Date Noted  . Essential (primary) hypertension 01/29/2015  . Class 1 obesity 01/29/2015  . H/O malignant neoplasm of prostate 12/15/2014  . 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 Diabetes mellitus, type 2 ([place]) 02/07/2014  . Type 2 diabetes mellitus ([place]) 02/07/2014  . Bladder neck obstruction 01/02/2014  . Bladder retention 01/02/2014  . Abnormal prostate specific antigen 09/29/2011  . Elevated prostate specific antigen (PSA) 09/29/2011  . CA of prostate ([place]) 04/07/2011  . 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 Malignant neoplasm of prostate ([place]) 04/07/2011   [name], MS/CCC- SLP  Lou Miner 03/01/2015, 11:00 AM  [place] MAIN [facility] SERVICES [address] [place], [place], [address] Phone: [phone]   Fax:  [phone]   Name: Chad Sandoval MRN: [id number] Date of Birth: 01-25-1946

## 2015-03-02 ENCOUNTER — Ambulatory Visit: Payer: Medicare Other | Admitting: Occupational Therapy

## 2015-03-02 ENCOUNTER — Ambulatory Visit: Payer: Medicare Other | Admitting: Speech Pathology

## 2015-03-02 ENCOUNTER — Encounter: Payer: Self-pay | Admitting: Speech Pathology

## 2015-03-02 DIAGNOSIS — R279 Unspecified lack of coordination: Secondary | ICD-10-CM

## 2015-03-02 DIAGNOSIS — Z741 Need for assistance with personal care: Secondary | ICD-10-CM

## 2015-03-02 DIAGNOSIS — R490 Dysphonia: Secondary | ICD-10-CM | POA: Diagnosis not present

## 2015-03-02 DIAGNOSIS — R460 Very low level of personal hygiene: Secondary | ICD-10-CM

## 2015-03-02 DIAGNOSIS — R2681 Unsteadiness on feet: Secondary | ICD-10-CM

## 2015-03-02 DIAGNOSIS — M6281 Muscle weakness (generalized): Secondary | ICD-10-CM

## 2015-03-02 DIAGNOSIS — R262 Difficulty in walking, not elsewhere classified: Secondary | ICD-10-CM

## 2015-03-02 NOTE — Therapy (Signed)
[place] MAIN [facility] SERVICES [address] [place], [place], [address] Phone: [phone]   Fax:  [phone]  Speech Language Pathology Treatment  Patient Details  Name: Chad Sandoval MRN: [id number] Date of Birth: [birth year] Referring Provider: Dr. Manuella Ghazi  Encounter Date: 03/02/2015      End of Session - 03/02/15 1144    Visit Number 4   Number of Visits 17   Date for SLP Re-Evaluation 03/30/15   SLP Start Time 1000   SLP Stop Time  1049   SLP Time Calculation (min) 49 min   Activity Tolerance Patient tolerated treatment well      Past Medical History  Diagnosis Date  . Prostate cancer [facility]) 2003    s/p radical prostatectomy  . Thrombocytopenia ([place])   . History of radiation therapy 11/03/13- 12/19/13    prostate bed 6600 cGy 33 sessions  . 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 Hypertension     Past Surgical History  Procedure Laterality Date  . Retropubic prostatectomy  03/02/2001    Gleason 7  . 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  Visit Diagnosis: Dysphonia      Subjective Assessment - 03/02/15 1143    Subjective Patient reports that his wife has noticed improved facial movement   Currently in Pain? No/denies               ADULT SLP TREATMENT - 03/02/15 0001    General Information   Behavior/Cognition Alert;Cooperative;Pleasant mood   HPI Parkinson's disease   Treatment Provided   Treatment provided Cognitive-Linquistic   Pain Assessment   Pain Assessment No/denies pain   Cognitive-Linquistic Treatment   Treatment focused on Voice   Skilled Treatment Daily Task #1: Average 19 seconds, 85 dB. Daily Task 2: Highs: 15 high pitched "ah" given min cues. Lows: 15 low pitched "ah" given min-mod cues. Daily task #3: Average 75 dB.  Hierarchal speech loudness drill: Read 2-3 sentences, 73 dB.  Homework: assignments completed.  Off the cuff remarks:  average 68 dB.   Assessment / Recommendations / Plan   Plan Continue with current plan of care   Progression Toward Goals   Progression toward goals Progressing toward goals          SLP Education - 03/02/15 1143    Education provided Yes   Education Details LSVT-LOUD   Person(s) Educated Patient   Methods Explanation;Demonstration;Verbal cues;Handout   Comprehension Verbalized understanding;Returned demonstration;Verbal cues required;Need further instruction            SLP Long Term Goals - 02/27/15 1025    SLP LONG TERM GOAL #1   Title The patient will complete Daily Tasks (Maximum duration "ah", High/Lows, and Functional Phrases) at average loudness of 80 dB and with loud, good quality voice.    Time 4   Period Weeks   Status New   SLP LONG TERM GOAL #2   Title The patient will complete Hierarchal Speech Loudness reading drills (words/phrases, sentences, and paragraph) at average 75 dB and with loud, good quality voice.     Time 4   Period Weeks   Status New   SLP LONG TERM GOAL #3   Title The patient will complete homework daily.   Time 4   Period Weeks   Status New   SLP LONG TERM GOAL #4   Title The patient will participate in conversation,  maintaining average loudness of 75 dB and loud, good quality voice.   Time 4   Period Weeks   Status New          Plan - 03/02/15 1144    Clinical Impression Statement The patient is completing daily tasks and hierarchal speech drill tasks with loud, good quality voice given min SLP cues.  The patient is exhibiting increased facial gestures.   Speech Therapy Frequency 4x / week   Duration 4 weeks   Treatment/Interventions Other (comment)  LSVT-LOUD   Potential to Achieve Goals Good   Potential Considerations Ability to learn/carryover information;Cooperation/participation level;Previous level of function;Severity of impairments;Family/community support   SLP Home Exercise Plan LSVT-LOUD daily homework   Consulted and  Agree with Plan of Care Patient        Problem List Patient Active Problem List   Diagnosis Date Noted  . Essential (primary) hypertension 01/29/2015  . Class 1 obesity 01/29/2015  . H/O malignant neoplasm of prostate 12/15/2014  . 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 Diabetes mellitus, type 2 ([place]) 02/07/2014  . Type 2 diabetes mellitus ([place]) 02/07/2014  . Bladder neck obstruction 01/02/2014  . Bladder retention 01/02/2014  . Abnormal prostate specific antigen 09/29/2011  . Elevated prostate specific antigen (PSA) 09/29/2011  . CA of prostate ([place]) 04/07/2011  . 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 Malignant neoplasm of prostate ([place]) 04/07/2011   [name], MS/CCC- SLP  Lou Miner 03/02/2015, 11:45 AM  [place] [address] [place], [place], [address] Phone: [phone]   Fax:  [phone]   Name: Chad Sandoval MRN: [id number] Date of Birth: 05/18/1945

## 2015-03-03 ENCOUNTER — Encounter: Payer: Self-pay | Admitting: Oncology

## 2015-03-03 NOTE — Therapy (Signed)
Chad Sandoval [facility] SERVICES [address] [place], [place], [address] Phone: [phone]   Fax:  [phone]  Occupational Therapy Treatment  Patient Details  Name: Chad Sandoval MRN: [id number] Date of Birth: May 07, 1945 Referring Provider: Manuella Ghazi  Encounter Date: 03/02/2015      OT End of Session - 03/03/15 1600    Visit Number 4   Number of Visits 17   Date for OT Re-Evaluation 03/27/15   Authorization Type medicare G codes 4   OT Start Time 1100   OT Stop Time 1155   OT Time Calculation (min) 55 min   Activity Tolerance Patient tolerated treatment well   Behavior During Therapy [facility] for tasks assessed/performed      Past Medical History  Diagnosis Date  . Prostate cancer [facility]) 2003    s/p radical prostatectomy  . Thrombocytopenia ([place])   . History of radiation therapy 11/03/13- 12/19/13    prostate bed 6600 cGy 33 sessions  . 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 Hypertension     Past Surgical History  Procedure Laterality Date  . Retropubic prostatectomy  03/02/2001    Gleason 7  . 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  Visit Diagnosis:  Difficulty walking  Lack of coordination  Muscle weakness (generalized)  Self-care deficit for dressing and grooming  Unsteady gait      Subjective Assessment - 03/03/15 1556    Subjective  Pt reports he did his exercises last night and they went fairly well, still feels he needs additional instruction to become proficient.  He is aware he will need to perform exercises 2 times a day over the weekend.    Patient Stated Goals Patient reports he wants to remain independent with his daily tasks, continue to work part time, wants his walking to be improved and get his golf game back again.   Currently in Pain? No/denies   Pain Score 0-No pain                      OT Treatments/Exercises (OP) - 03/02/15  1557    ADLs   ADL Comments Established and instructed on functional component tasks this date as follows: sit to stand from a variety of surfaces, handwriting, hand to mouth patterns for eating, motion of throwing a ball and manipulation of small buttons.  increased focus this date on handwriting for name, address, printed and scripted using big letter formation.   Neurological Re-education Exercises   Other Exercises 1 Patient seen for instruction of LSVT BIG exercises: LSVT Daily Session Maximal Daily Exercises: Sustained movements are designed to rescale the amplitude of movement output for generalization to daily functional activities. Performed as follows for 1 set of 10 repetitions each: Multi directional sustained movements- 1) Floor to ceiling, 2) Side to side. Multi directional Repetitive movements performed in standing and are designed to provide retraining effort needed for sustained muscle activation in tasks Performed as follows: 3) Step and reach forward, 4) Step and Reach Backwards, 5) Step and reach sideways, 6) Rock and reach forward/backward, 7) Rock and reach sideways. Sit to stand from mat table on lowest setting with cues for weight shift, technique and CGA for 5 reps for 2 sets. Patient seen for functional mobility tasks this date with emphasis on gait speed, length of steps with short distance ambulation. Patient requires cues for BIG movements  and cadence.All standing exercises performed this date with CGA, verbal and tactile cues as needed.   Other Exercises 2 Functional mobility tasks with gait for 800 feet this date with SBA and cues for amplitude of gait and moderate cues for reciprocal arm swing.                 OT Education - 03/03/15 1600    Education provided Yes   Education Details HEP, maximal daily exercises, functional component tasks.   Person(s) Educated Patient   Methods Explanation;Demonstration;Verbal cues   Comprehension Verbal cues  required;Returned demonstration;Verbalized understanding             OT Long Term Goals - 03/01/15 2145    OT LONG TERM GOAL #1   Title Patient will improve gait speed and endurance and be able to walk 1750 feet in 6 minutes to negotiate around the home and community safely in 4 weeks.   Time 4   Period Weeks   Status New   OT LONG TERM GOAL #2   Title Patient will complete HEP for maximal daily exercises with modified independence in 4 weeks   Baseline no current program   Time 4   Period Weeks   Status New   OT LONG TERM GOAL #3   Title Patient will transfer from sit to stand without the use of arms safely and independently from a variety of chairs/surfaces in 4 weeks.    Baseline difficulty from low surfaces   Time 4   Status New   OT LONG TERM GOAL #4   Title Patient will be modified independent with home and work tasks including squatting to pick up items from the floor.   Time 4   Period Weeks   Status New   OT LONG TERM GOAL #5   Title Patient will demonstrate 90%legibility with handwriting for name and address with use of normal sized letters.   Time 4   Period Weeks   Status New               Plan - 03/03/15 1601    Clinical Impression Statement Patient instructed on the need to perform maximal daily exercises 2 times a day for the next few days and demos understanding.  Advised him on the inclement weather policy since it may snow in the next day and the clinic could be closed on Monday, we will plan to see him the 4 other days of  next week for intensive LSVT BIG.  He has progressed well and able to respond to cues for reciprocal arm swing with functional mobility.  Gross motor coordination impaired with ball throwing tasks.    Pt will benefit from skilled therapeutic intervention in order to improve on the following deficits (Retired) Impaired flexibility;Decreased coordination;Decreased mobility;Decreased endurance;Decreased range of motion;Decreased  strength;Decreased balance;Difficulty walking;Impaired UE functional use   Rehab Potential Excellent   OT Frequency 4x / week   OT Duration 4 weeks   OT Treatment/Interventions Self-care/ADL training;Therapeutic exercise;Gait Training;Neuromuscular education;Stair Training;Functional Mobility Training;Patient/family education;Therapeutic activities;Balance training   Consulted and Agree with Plan of Care Patient        Problem List Patient Active Problem List   Diagnosis Date Noted  . Essential (primary) hypertension 01/29/2015  . Class 1 obesity 01/29/2015  . H/O malignant neoplasm of prostate 12/15/2014  . 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 Diabetes mellitus, type 2 ([place]) 02/07/2014  . Type 2 diabetes mellitus ([place]) 02/07/2014  . Bladder  neck obstruction 01/02/2014  . Bladder retention 01/02/2014  . Abnormal prostate specific antigen 09/29/2011  . Elevated prostate specific antigen (PSA) 09/29/2011  . CA of prostate ([place]) 04/07/2011  . 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 Malignant neoplasm of prostate ([place]) 04/07/2011   Amy T Lovett, OTR/L, CLT Lovett,Amy 03/03/2015, 4:04 PM  [place] [facility] SERVICES [address] [place], [place], [address] Phone: [phone]   Fax:  [phone]  Name: Chad Sandoval MRN: [id number] Date of Birth: Jul 12, 1945

## 2015-03-03 NOTE — Progress Notes (Signed)
Biopsy report was not available so patient was not examined  [place] @ [facility] [phone]  Fax:([phone] [place] OB: [birth year]  MR#: [id number]  RAQ#:[id number]  Patient Care Team: Dion Body, MD as PCP - General (Family Medicine)  CHIEF COMPLAINT:  Chief Complaint  Patient presents with  . Lymphoma of spleen    Oncology History   thrombocytopenia, transient neutropenia enlarged spleen for several years.patient was being followed at [facility]. 2.Bone marrow biopsy (October, 2012)  No diagnostic features suggestive of myeloproliferative disease.   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  Recent biopsy from right parotid gland because of abnormality on a PET scan (December, 2016) was negative for any lymphomatous involvement INTERVAL HISTORY:  70 year old gentleman came today further follow-up has gained weight.  Feeling weak and tired.  Patient has an appointment pending with primary care physician for complete checkup.  Here for further follow-up regarding splenic marginal zone lymphoma status postsplenectomy  Patient had developed Parkinson's disease..  Was evaluated by a neurologist.  During that process and MRI scan of brain was done which has been reviewed independently.  No evidence of metastases to the brain was found however intraparotid lymphadenopathy was found. Patient has been here for further discussion regarding biopsy results.  Slightly apprehensive.  But not any acute distress.  No chills.  No  fever.  Patient had a PET scan scheduled was also evaluated by ENT surgeon REVIEW OF SYSTEMS:   Gen. Status: No chills.  No fever.  Feeling weak and fatigued Lungs: No shortness of breath Cardiac: No chest pain GI: No nausea no vomiting no diarrhea GU: No dysuria or hematuria Skin: No rash Neurological system: No dizziness.  No tingling.  No numbness Lower extremity: No swelling  As per HPI. Otherwise, a complete review of systems is negatve.  PAST MEDICAL HISTORY: Past Medical History  Diagnosis Date  . Prostate cancer [facility]) 2003    s/p radical prostatectomy  . Thrombocytopenia ([place])   . History of radiation therapy 11/03/13- 12/19/13    prostate bed 6600 cGy 33 sessions  . 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 Hypertension     PAST SURGICAL HISTORY: Past Surgical History  Procedure Laterality Date  . Retropubic prostatectomy  03/02/2001    Gleason 7  . Splenectomy  04/2012    [facility]    FAMILY HISTORY Family History  Problem Relation Age of Onset  . Cancer Father     prostate  . Alzheimer's disease Mother     ADVANCED DIRECTIVES: Patient does have advanced health care directive. HEALTH MAINTENANCE: Social History  Substance Use Topics  . Smoking status: Never Smoker   . Smokeless tobacco: None  . Alcohol Use: No      Allergies  Allergen Reactions  . Oxycontin [Oxycodone Hcl] Other (See Comments)    "caused him to be really hot"    Current Outpatient Prescriptions  Medication Sig Dispense Refill  . acetaminophen (TYLENOL) 500 MG tablet Take 500 mg by mouth every  6 (six) hours as needed.    Marland Kitchen aspirin 81 MG chewable tablet Chew 81 mg by mouth.    . carbidopa-levodopa (SINEMET IR) 25-100 MG tablet TAKE 1 TABLET BY MOUTH 3 (THREE) TIMES DAILY.  8  . Cholecalciferol (VITAMIN D3) 10000 UNITS TABS Take by mouth.    Marland Kitchen lisinopril (PRINIVIL,ZESTRIL) 10 MG tablet Take by mouth.    . Multiple Vitamin (MULTI-VITAMINS) TABS Take 1 tablet by  mouth.    . sildenafil (VIAGRA) 100 MG tablet Take 100 mg by mouth.     No current facility-administered medications for this visit.    OBJECTIVE:  Filed Vitals:   02/27/15 1159  BP: 159/84  Pulse: 74  Temp: 96.9 F (36.1 C)     Body mass index is 33.26 kg/(m^2).    ECOG FS:0 - Asymptomatic  PHYSICAL EXAM: General  status: Performance status is good.  Patient has not lost significant weight HEENT: No evidence of stomatitis. Sclera and conjunctivae :: No jaundice.   pale looking. Lungs: Air  entry equal on both sides.  No rhonchi.  No rales.  Cardiac: Heart sounds are normal.  No pericardial rub.  No murmur. Lymphatic system: Cervical, axillary, inguinal, lymph nodes not palpable GI: Abdomen is soft.  No ascites.  Liver spleen not palpable.  No tenderness.  Bowel sounds are within normal limit Lower extremity: No edema Neurological system: Recently patient has developed rigidity and tremors was evaluated by a neurologist and was found to have Parkinson's disease  Skin: No rash.  No ecchymosis.Marland Kitchen   LAB RESULTS:  No visits with results within 2 Day(s) from this visit. Latest known visit with results is:  Hospital Outpatient Visit on 02/05/2015  Component Date Value Ref Range Status  . SURGICAL PATHOLOGY 02/05/2015    Final                   Value:Surgical Pathology CASE: [id number] PATIENT: [facility] Surgical Pathology Report     SPECIMEN SUBMITTED: A. Parotid gland, right  CLINICAL HISTORY: Right parotid mass possible intra-gland node  PRE-OPERATIVE DIAGNOSIS: None Provided  POST-OPERATIVE DIAGNOSIS: None provided     DIAGNOSIS: A. RIGHT PAROTID GLAND; BIOPSY: - NEGATIVE FOR MALIGNANCY. - FINDINGS CONSISTENT WITH REACTIVE LYMPHOID HYPERPLASIA, SEE COMMENT.  Comment: A sample was sent for flow cytometric analysis ([employer] case number [id number], collected 02/05/15). In summary: No immunophenotypic evidence of a monoclonal B cell  or aberrant T-cell population.  Additionally, the specimen was sent to [facility] for external consultation and the findings were incorporated above (case number [id number]). This is the comment of external consultant Dr. Betsey Amen: Histologic sections reveal portions of reactive appearing lymphoid hyperplasia with disrupte                         d follicles. Immunostains reveal that the germinal center based B cells express CD20, BCL-6 (subset), CD10 (subset), while they are negative for BCL-2. CD3 and CD5 immunostains highlight numerous reactive small T cells. Cyclin D1 immunostain is negative on lymphocytes. CD23 and CD21 immunostains highlight a disrupted follicular dendritic meshwork. By report, the flow cytometric analysis reveals no immunophenotypic evidence of a monoclonal B cell population or aberrant T cells. The overall findings are consistent with reactive lymphoid hyperplasia. No diagnostic evidence of lymphoma is identified within the submitted sample. Representative slides from this case were reviewed by other pathologists in our department for quality assurance.   GROSS DESCRIPTION:  Labeled: right parotid  Tissue fragment(s): multiple cores and fragments  Size: aggregate, 1.1 x 0.7 x 0.1 cm  Description: pink to tan fragments and cores, wrapped in embedding paper and submitted in a mesh b                         ag,  Entirely submitted in 1 cassette(s).  A fragment is also submitted in RPMI media for flow cytometry. Final Diagnosis performed by Delorse Lek, MD.  Electronically signed 02/20/2015 10:17:43AM    The electronic signature indicates that the named Attending Pathologist has evaluated the specimen  Technical component performed at [facility], [address], [place], [place] [address] Lab: [phone] Dir: Darrick Penna. Evette Doffing, MD  Professional component performed at [facility], [place],  [place], [place] [address] Lab: [phone] Dir: Dellia Nims. Rubinas, MD        STUDIES:No results found for: PSA ASSESSMENT: Marginal zone lymphoma of the spleen status postsplenectomy Recent diagnosis of Parkinson's disease MRI and PET scan has been reviewed independently  MEDICAL DECISION MAKING:  Lab data has been reviewed MRI scan of November 2 016 shows normal MRI scan of brain enlarged lymph node within the right parotid gland.  Which are new. PET scan shows hypermetabolic intraparotid lymphadenopathy  Biopsy of hypermetabolic intraparotid lymph node: To biopsy that was no monoclonal population of B cells and biopsy was completely negative.  Any lymphomatous involvement.  Biopsy has been reviewed with [facility]. No further therapy is indicated. continue  follow-up has been recommended. Duration of visit is 20   minutes and 50% of time was spent discussing biopsy results as well as nature of the disease. No matching staging information was found for the patient.  [place] Gleason, MD   03/03/2015 4:26 PM

## 2015-03-03 NOTE — Therapy (Signed)
[place] MAIN [facility] SERVICES [address] [place], [place], [address] Phone: [phone]   Fax:  [phone]  Occupational Therapy Treatment  Patient Details  Name: Chad Sandoval MRN: [id number] Date of Birth: 10/03/1945 Referring Provider: Manuella Ghazi  Encounter Date: 03/01/2015    Past Medical History  Diagnosis Date  . Prostate cancer [facility]) 2003    s/p radical prostatectomy  . Thrombocytopenia ([place])   . History of radiation therapy 11/03/13- 12/19/13    prostate bed 6600 cGy 33 sessions  . 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 Hypertension     Past Surgical History  Procedure Laterality Date  . Retropubic prostatectomy  03/02/2001    Gleason 7  . Splenectomy  04/2012    [facility]    There were no vitals filed for this visit.  Visit Diagnosis:  Difficulty walking  Lack of coordination  Muscle weakness (generalized)  Self-care deficit for dressing and grooming  Unsteady gait      Subjective Assessment - 03/02/15 1551    Subjective  Patient reports he is doing well, no pain this date, slight soreness this am when he got up.     Patient Stated Goals Patient reports he wants to remain independent with his daily tasks, continue to work part time, wants his walking to be improved and get his golf game back again.                      OT Treatments/Exercises (OP) - 03/02/15 1553    Neurological Re-education Exercises   Other Exercises 1 Patient seen for instruction of LSVT BIG exercises: LSVT Daily Session Maximal Daily Exercises: Sustained movements are designed to rescale the amplitude of movement output for generalization to daily functional activities. Performed as follows for 1 set of 10 repetitions each: Multi directional sustained movements- 1) Floor to ceiling, 2) Side to side. Multi directional Repetitive movements performed in standing and are designed to provide retraining effort needed  for sustained muscle activation in tasks Performed as follows: 3) Step and reach forward, 4) Step and Reach Backwards, 5) Step and reach sideways, 6) Rock and reach forward/backward, 7) Rock and reach sideways. Sit to stand from mat table on lowest setting with cues for weight shift, technique and CGA for 5 reps for 2 sets. Patient seen for functional mobility tasks this date with emphasis on gait speed, length of steps with short distance ambulation. Patient requires cues for BIG movements and cadence.All standing exercises performed this date with CGA, verbal and tactile cues as needed.   Other Exercises 2 Patient seen for balance tasks this date on balance pad with multi rep ball toss overhead to reach target, then tossing into basket.  Incoordination of throwing mechanics of right arm.                 OT Education - 03/02/15 1552    Education provided Yes             OT Long Term Goals - 03/01/15 2145    OT LONG TERM GOAL #1   Title Patient will improve gait speed and endurance and be able to walk 1750 feet in 6 minutes to negotiate around the home and community safely in 4 weeks.   Time 4   Period Weeks   Status New   OT LONG TERM GOAL #2   Title Patient will complete HEP for maximal daily exercises with modified independence in  4 weeks   Baseline no current program   Time 4   Period Weeks   Status New   OT LONG TERM GOAL #3   Title Patient will transfer from sit to stand without the use of arms safely and independently from a variety of chairs/surfaces in 4 weeks.    Baseline difficulty from low surfaces   Time 4   Status New   OT LONG TERM GOAL #4   Title Patient will be modified independent with home and work tasks including squatting to pick up items from the floor.   Time 4   Period Weeks   Status New   OT LONG TERM GOAL #5   Title Patient will demonstrate 90%legibility with handwriting for name and address with use of normal sized letters.   Time 4    Period Weeks   Status New               Plan - 03/02/15 1552    Clinical Impression Statement Patient has continued to make good progress this week with introduction of maximal daily exercises for home program.  Has been seen for balance tasks this date using balance pad and combining right UE throwing motion which is an area of impairment for him.  Issued written/pictorial instructions for maximal daily exercises to perform another set this date at home.  CGA to SBA for exercises this date in addition to cues.    Pt will benefit from skilled therapeutic intervention in order to improve on the following deficits (Retired) Impaired flexibility;Decreased coordination;Decreased mobility;Decreased endurance;Decreased range of motion;Decreased strength;Decreased balance;Difficulty walking;Impaired UE functional use   Rehab Potential Excellent   OT Frequency 4x / week   OT Duration 4 weeks   OT Treatment/Interventions Self-care/ADL training;Therapeutic exercise;Gait Training;Neuromuscular education;Stair Training;Functional Mobility Training;Patient/family education;Therapeutic activities;Balance training   Consulted and Agree with Plan of Care Patient        Problem List Patient Active Problem List   Diagnosis Date Noted  . Essential (primary) hypertension 01/29/2015  . Class 1 obesity 01/29/2015  . H/O malignant neoplasm of prostate 12/15/2014  . 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 Diabetes mellitus, type 2 ([place]) 02/07/2014  . Type 2 diabetes mellitus ([place]) 02/07/2014  . Bladder neck obstruction 01/02/2014  . Bladder retention 01/02/2014  . Abnormal prostate specific antigen 09/29/2011  . Elevated prostate specific antigen (PSA) 09/29/2011  . CA of prostate ([place]) 04/07/2011  . 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 Malignant neoplasm of prostate ([place]) 04/07/2011   Fiore Detjen T Mansa Willers, OTR/L, CLT  Chad Sandoval 03/03/2015, 3:55 PM  [place] MAIN [facility] SERVICES [address] [place], [place], [address] Phone: [phone]   Fax:  [phone]  Name: Chad Sandoval MRN: [id number] Date of Birth: 1945-12-27

## 2015-03-05 ENCOUNTER — Ambulatory Visit: Payer: Medicare Other | Admitting: Speech Pathology

## 2015-03-05 ENCOUNTER — Ambulatory Visit: Payer: Medicare Other | Admitting: Occupational Therapy

## 2015-03-05 ENCOUNTER — Encounter: Payer: Medicare Other | Admitting: Speech Pathology

## 2015-03-06 ENCOUNTER — Ambulatory Visit: Payer: Medicare Other | Admitting: Occupational Therapy

## 2015-03-06 ENCOUNTER — Ambulatory Visit: Payer: Medicare Other | Admitting: Speech Pathology

## 2015-03-07 ENCOUNTER — Encounter: Payer: Self-pay | Admitting: Speech Pathology

## 2015-03-07 ENCOUNTER — Ambulatory Visit: Payer: Medicare Other | Admitting: Speech Pathology

## 2015-03-07 ENCOUNTER — Ambulatory Visit: Payer: Medicare Other | Admitting: Occupational Therapy

## 2015-03-07 DIAGNOSIS — M6281 Muscle weakness (generalized): Secondary | ICD-10-CM

## 2015-03-07 DIAGNOSIS — R2681 Unsteadiness on feet: Secondary | ICD-10-CM

## 2015-03-07 DIAGNOSIS — R490 Dysphonia: Secondary | ICD-10-CM

## 2015-03-07 DIAGNOSIS — R460 Very low level of personal hygiene: Secondary | ICD-10-CM

## 2015-03-07 DIAGNOSIS — Z741 Need for assistance with personal care: Secondary | ICD-10-CM

## 2015-03-07 DIAGNOSIS — R279 Unspecified lack of coordination: Secondary | ICD-10-CM

## 2015-03-07 DIAGNOSIS — R262 Difficulty in walking, not elsewhere classified: Secondary | ICD-10-CM

## 2015-03-07 NOTE — Therapy (Signed)
[place] MAIN [facility] SERVICES [address] [place], [place], [address] Phone: [phone]   Fax:  [phone]  Speech Language Pathology Treatment  Patient Details  Name: MCCRAE CHANN MRN: [id number] Date of Birth: [birth year] Referring Provider: Dr. Manuella Ghazi  Encounter Date: 03/07/2015      End of Session - 03/07/15 1555    Visit Number 5   Number of Visits 17   Date for SLP Re-Evaluation 03/30/15   SLP Start Time 1000   SLP Stop Time  1050   SLP Time Calculation (min) 50 min   Activity Tolerance Patient tolerated treatment well      Past Medical History  Diagnosis Date  . Prostate cancer [facility] 2003    s/p radical prostatectomy  . Thrombocytopenia ([place])   . History of radiation therapy 11/03/13- 12/19/13    prostate bed 6600 cGy 33 sessions  . 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 Hypertension     Past Surgical History  Procedure Laterality Date  . Retropubic prostatectomy  03/02/2001    Gleason 7  . Splenectomy  04/2012    [facility]    There were no vitals filed for this visit.  Visit Diagnosis: Dysphonia      Subjective Assessment - 03/07/15 1554    Subjective The patient reports that he was diligent in doing his homework twice a day   Currently in Pain? No/denies               ADULT SLP TREATMENT - 03/07/15 0001    General Information   Behavior/Cognition Alert;Cooperative;Pleasant mood   HPI Parkinson's disease   Treatment Provided   Treatment provided Cognitive-Linquistic   Pain Assessment   Pain Assessment No/denies pain   Cognitive-Linquistic Treatment   Treatment focused on Voice   Skilled Treatment Daily Task #1: Average 19 seconds, 85 dB. Daily Task 2: Highs: 15 high pitched "ah" given min cues. Lows: 15 low pitched "ah" given min-mod cues. Daily task #3: Average 75 dB.  Hierarchal speech loudness drill: Read paragraphs, 73 dB.  Generate short responses, 72 dB (given min cues).   Homework: assignments completed.  Off the cuff remarks: average 68 dB.   Assessment / Recommendations / Plan   Plan Continue with current plan of care   Progression Toward Goals   Progression toward goals Progressing toward goals          SLP Education - 03/07/15 1554    Education provided Yes   Education Details LSVT-LOUD   Person(s) Educated Patient   Methods Explanation;Demonstration;Verbal cues;Handout   Comprehension Verbalized understanding;Returned demonstration;Verbal cues required;Need further instruction            SLP Long Term Goals - 02/27/15 1025    SLP LONG TERM GOAL #1   Title The patient will complete Daily Tasks (Maximum duration "ah", High/Lows, and Functional Phrases) at average loudness of 80 dB and with loud, good quality voice.    Time 4   Period Weeks   Status New   SLP LONG TERM GOAL #2   Title The patient will complete Hierarchal Speech Loudness reading drills (words/phrases, sentences, and paragraph) at average 75 dB and with loud, good quality voice.     Time 4   Period Weeks   Status New   SLP LONG TERM GOAL #3   Title The patient will complete homework daily.   Time 4   Period Weeks   Status New   SLP LONG  TERM GOAL #4   Title The patient will participate in conversation, maintaining average loudness of 75 dB and loud, good quality voice.   Time 4   Period Weeks   Status New          Plan - 03/07/15 1555    Clinical Impression Statement The patient is completing daily tasks and hierarchal speech drill tasks with loud, good quality voice given min SLP cues.  The patient is exhibiting increased facial gestures.   Speech Therapy Frequency 4x / week   Duration 4 weeks   Treatment/Interventions Other (comment)  LSVT-LOUD   Potential to Achieve Goals Good   Potential Considerations Ability to learn/carryover information;Cooperation/participation level;Previous level of function;Severity of impairments;Family/community support   SLP Home  Exercise Plan LSVT-LOUD daily homework   Consulted and Agree with Plan of Care Patient        Problem List Patient Active Problem List   Diagnosis Date Noted  . Essential (primary) hypertension 01/29/2015  . Class 1 obesity 01/29/2015  . H/O malignant neoplasm of prostate 12/15/2014  . 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 Diabetes mellitus, type 2 ([place]) 02/07/2014  . Type 2 diabetes mellitus ([place]) 02/07/2014  . Bladder neck obstruction 01/02/2014  . Bladder retention 01/02/2014  . Abnormal prostate specific antigen 09/29/2011  . Elevated prostate specific antigen (PSA) 09/29/2011  . CA of prostate ([place]) 04/07/2011  . 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 Malignant neoplasm of prostate ([place]) 04/07/2011   [name], MS/CCC- SLP  Lou Miner 03/07/2015, 3:56 PM  [place] MAIN [facility] SERVICES [address] [place], [place], [address] Phone: [phone]   Fax:  [phone]   Name: MELVERN FAUBION MRN: [id number] Date of Birth: [birth year]

## 2015-03-08 ENCOUNTER — Ambulatory Visit: Payer: Medicare Other | Admitting: Occupational Therapy

## 2015-03-08 ENCOUNTER — Encounter: Payer: Self-pay | Admitting: Speech Pathology

## 2015-03-08 ENCOUNTER — Ambulatory Visit: Payer: Medicare Other | Admitting: Speech Pathology

## 2015-03-08 DIAGNOSIS — Z741 Need for assistance with personal care: Secondary | ICD-10-CM

## 2015-03-08 DIAGNOSIS — R460 Very low level of personal hygiene: Secondary | ICD-10-CM

## 2015-03-08 DIAGNOSIS — R279 Unspecified lack of coordination: Secondary | ICD-10-CM

## 2015-03-08 DIAGNOSIS — R490 Dysphonia: Secondary | ICD-10-CM

## 2015-03-08 DIAGNOSIS — M6281 Muscle weakness (generalized): Secondary | ICD-10-CM

## 2015-03-08 NOTE — Therapy (Signed)
[place] MAIN [facility] SERVICES [address] [place], [place], [address] Phone: [phone]   Fax:  [phone]  Speech Language Pathology Treatment  Patient Details  Name: Chad Sandoval MRN: [id number] Date of Birth: [birth year] Referring Provider: Dr. Manuella Ghazi  Encounter Date: 03/08/2015      End of Session - 03/08/15 1615    Visit Number 6   Number of Visits 17   Date for SLP Re-Evaluation 03/30/15   SLP Start Time 1000   SLP Stop Time  1057   SLP Time Calculation (min) 57 min   Activity Tolerance Patient tolerated treatment well      Past Medical History  Diagnosis Date  . Prostate cancer [facility]) 2003    s/p radical prostatectomy  . Thrombocytopenia ([place])   . History of radiation therapy 11/03/13- 12/19/13    prostate bed 6600 cGy 33 sessions  . 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 Hypertension     Past Surgical History  Procedure Laterality Date  . Retropubic prostatectomy  03/02/2001    Gleason 7  . 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  Visit Diagnosis: Dysphonia      Subjective Assessment - 03/08/15 1613    Subjective The patient is consistent in doing his homework.   Currently in Pain? No/denies               ADULT SLP TREATMENT - 03/08/15 0001    General Information   Behavior/Cognition Alert;Cooperative;Pleasant mood   HPI Parkinson's disease   Treatment Provided   Treatment provided Cognitive-Linquistic   Pain Assessment   Pain Assessment No/denies pain   Cognitive-Linquistic Treatment   Treatment focused on Voice   Skilled Treatment Daily Task #1: Average 19 seconds, 85 dB. Daily Task 2: Highs: 15 high pitched "ah" given min cues. Lows: 15 low pitched "ah" given min-mod cues. Daily task #3: Average 75 dB.  Hierarchal speech loudness drill: Read paragraphs, 73 dB.  Generate short responses, 72 dB (given min cues).  Homework: assignments  completed.  Off the cuff remarks: average 69 dB.   Assessment / Recommendations / Plan   Plan Continue with current plan of care   Progression Toward Goals   Progression toward goals Progressing toward goals          SLP Education - 03/08/15 1614    Education provided No   Education Details LSVT-LOUD   Person(s) Educated Patient   Methods Explanation;Demonstration;Verbal cues;Handout   Comprehension Verbalized understanding;Returned demonstration;Verbal cues required;Need further instruction            SLP Long Term Goals - 02/27/15 1025    SLP LONG TERM GOAL #1   Title The patient will complete Daily Tasks (Maximum duration "ah", High/Lows, and Functional Phrases) at average loudness of 80 dB and with loud, good quality voice.    Time 4   Period Weeks   Status New   SLP LONG TERM GOAL #2   Title The patient will complete Hierarchal Speech Loudness reading drills (words/phrases, sentences, and paragraph) at average 75 dB and with loud, good quality voice.     Time 4   Period Weeks   Status New   SLP LONG TERM GOAL #3   Title The patient will complete homework daily.   Time 4   Period Weeks   Status New   SLP LONG TERM GOAL #4   Title  The patient will participate in conversation, maintaining average loudness of 75 dB and loud, good quality voice.   Time 4   Period Weeks   Status New          Plan - 03/08/15 1615    Clinical Impression Statement The patient is completing daily tasks and hierarchal speech drill tasks with loud, good quality voice given min SLP cues.  The patient is exhibiting increased facial gestures. He is demonstrating emerging generalization into conversational speech.   Speech Therapy Frequency 4x / week   Duration 4 weeks   Treatment/Interventions Other (comment)  LSVT-LOUD   Potential to Achieve Goals Good   Potential Considerations Ability to learn/carryover information;Cooperation/participation level;Previous level of function;Severity of  impairments;Family/community support   SLP Home Exercise Plan LSVT-LOUD daily homework   Consulted and Agree with Plan of Care Patient        Problem List Patient Active Problem List   Diagnosis Date Noted  . Essential (primary) hypertension 01/29/2015  . Class 1 obesity 01/29/2015  . H/O malignant neoplasm of prostate 12/15/2014  . 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 Diabetes mellitus, type 2 ([place]) 02/07/2014  . Type 2 diabetes mellitus ([place]) 02/07/2014  . Bladder neck obstruction 01/02/2014  . Bladder retention 01/02/2014  . Abnormal prostate specific antigen 09/29/2011  . Elevated prostate specific antigen (PSA) 09/29/2011  . CA of prostate ([place]) 04/07/2011  . 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 Malignant neoplasm of prostate ([place]) 04/07/2011   [name], MS/CCC- SLP  Lou Miner 03/08/2015, 4:16 PM  [place] MAIN [facility] SERVICES [address] [place], [place], [address] Phone: [phone]   Fax:  [phone]   Name: SHRAGA KOWAL MRN: [id number] Date of Birth: November 19, 1945

## 2015-03-09 ENCOUNTER — Ambulatory Visit: Payer: Medicare Other | Admitting: Speech Pathology

## 2015-03-09 ENCOUNTER — Ambulatory Visit: Payer: Medicare Other | Admitting: Occupational Therapy

## 2015-03-09 ENCOUNTER — Encounter: Payer: Self-pay | Admitting: Speech Pathology

## 2015-03-09 DIAGNOSIS — R262 Difficulty in walking, not elsewhere classified: Secondary | ICD-10-CM

## 2015-03-09 DIAGNOSIS — M6281 Muscle weakness (generalized): Secondary | ICD-10-CM

## 2015-03-09 DIAGNOSIS — R490 Dysphonia: Secondary | ICD-10-CM

## 2015-03-09 DIAGNOSIS — R460 Very low level of personal hygiene: Secondary | ICD-10-CM

## 2015-03-09 DIAGNOSIS — R2681 Unsteadiness on feet: Secondary | ICD-10-CM

## 2015-03-09 DIAGNOSIS — R279 Unspecified lack of coordination: Secondary | ICD-10-CM

## 2015-03-09 DIAGNOSIS — Z741 Need for assistance with personal care: Secondary | ICD-10-CM

## 2015-03-09 NOTE — Therapy (Signed)
[place] MAIN [facility] SERVICES [address] [place], [place], [address] Phone: [phone]   Fax:  [phone]  Speech Language Pathology Treatment  Patient Details  Name: Chad Sandoval MRN: [id number] Date of Birth: 12-Sep-1945 Referring Provider: Dr. Manuella Ghazi  Encounter Date: 03/09/2015      End of Session - 03/09/15 1053    Visit Number 7   Number of Visits 17   Date for SLP Re-Evaluation 03/30/15   SLP Start Time 1000   SLP Stop Time  1055   SLP Time Calculation (min) 55 min   Activity Tolerance Patient tolerated treatment well      Past Medical History  Diagnosis Date  . Prostate cancer [facility]) 2003    s/p radical prostatectomy  . Thrombocytopenia ([place])   . History of radiation therapy 11/03/13- 12/19/13    prostate bed 6600 cGy 33 sessions  . 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 Hypertension     Past Surgical History  Procedure Laterality Date  . Retropubic prostatectomy  03/02/2001    Gleason 7  . 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  Visit Diagnosis: Dysphonia      Subjective Assessment - 03/09/15 1053    Subjective The patient is consistent in doing his homework.   Currently in Pain? No/denies               ADULT SLP TREATMENT - 03/09/15 0001    General Information   Behavior/Cognition Alert;Cooperative;Pleasant mood   HPI Parkinson's disease   Treatment Provided   Treatment provided Cognitive-Linquistic   Pain Assessment   Pain Assessment No/denies pain   Cognitive-Linquistic Treatment   Treatment focused on Voice   Skilled Treatment Daily Task #1: Average 19 seconds, 85 dB. Daily Task 2: Highs: 15 high pitched "ah" given no cues. Lows: 15 low pitched "ah" given min cues. Daily task #3: Average 75 dB.  Hierarchal speech loudness drill: Read paragraphs, 73 dB.  Generate longer responses, 75 dB (given min cues).  Homework: assignments  completed.  Off the cuff remarks: average 70 dB.   Assessment / Recommendations / Plan   Plan Continue with current plan of care   Progression Toward Goals   Progression toward goals Progressing toward goals          SLP Education - 03/09/15 1053    Education provided Yes   Education Details LSVT-LOUD   Person(s) Educated Patient   Methods Explanation;Demonstration;Verbal cues;Handout   Comprehension Verbalized understanding;Returned demonstration;Verbal cues required;Need further instruction            SLP Long Term Goals - 02/27/15 1025    SLP LONG TERM GOAL #1   Title The patient will complete Daily Tasks (Maximum duration "ah", High/Lows, and Functional Phrases) at average loudness of 80 dB and with loud, good quality voice.    Time 4   Period Weeks   Status New   SLP LONG TERM GOAL #2   Title The patient will complete Hierarchal Speech Loudness reading drills (words/phrases, sentences, and paragraph) at average 75 dB and with loud, good quality voice.     Time 4   Period Weeks   Status New   SLP LONG TERM GOAL #3   Title The patient will complete homework daily.   Time 4   Period Weeks   Status New   SLP LONG TERM GOAL #4   Title  The patient will participate in conversation, maintaining average loudness of 75 dB and loud, good quality voice.   Time 4   Period Weeks   Status New          Plan - 03/09/15 1053    Clinical Impression Statement The patient is completing daily tasks and hierarchal speech drill tasks with loud, good quality voice given min SLP cues.  The patient is exhibiting increased facial gestures. He is demonstrating increasing generalization into conversational speech.   Speech Therapy Frequency 4x / week   Duration 4 weeks   Treatment/Interventions Other (comment)  LSVT-LOUD   Potential to Achieve Goals Good   Potential Considerations Ability to learn/carryover information;Cooperation/participation level;Previous level of function;Severity  of impairments;Family/community support   SLP Home Exercise Plan LSVT-LOUD daily homework   Consulted and Agree with Plan of Care Patient        Problem List Patient Active Problem List   Diagnosis Date Noted  . Essential (primary) hypertension 01/29/2015  . Class 1 obesity 01/29/2015  . H/O malignant neoplasm of prostate 12/15/2014  . 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 Diabetes mellitus, type 2 ([place]) 02/07/2014  . Type 2 diabetes mellitus ([place]) 02/07/2014  . Bladder neck obstruction 01/02/2014  . Bladder retention 01/02/2014  . Abnormal prostate specific antigen 09/29/2011  . Elevated prostate specific antigen (PSA) 09/29/2011  . CA of prostate ([place]) 04/07/2011  . 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 Malignant neoplasm of prostate ([place]) 04/07/2011   [name], MS/CCC- SLP  Lou Miner 03/09/2015, 10:55 AM  [place] MAIN [facility] SERVICES [address] [place], [place], [address] Phone: [phone]   Fax:  [phone]   Name: Chad Sandoval MRN: [id number] Date of Birth: [birth year]

## 2015-03-09 NOTE — Therapy (Signed)
[place] MAIN [facility] SERVICES [address] [place], [place], [address] Phone: [phone]   Fax:  [phone]  Occupational Therapy Treatment  Patient Details  Name: Chad Sandoval MRN: [id number] Date of Birth: [birth year] Referring Provider: Manuella Ghazi  Encounter Date: 03/07/2015      OT End of Session - 03/08/15 1450    Visit Number 5   Number of Visits 17   Date for OT Re-Evaluation 03/27/15   Authorization Type medicare G codes 5   OT Start Time 1100   OT Stop Time 1156   OT Time Calculation (min) 56 min   Activity Tolerance Patient tolerated treatment well   Behavior During Therapy [facility] for tasks assessed/performed      Past Medical History  Diagnosis Date  . Prostate cancer [facility]) 2003    s/p radical prostatectomy  . Thrombocytopenia ([place])   . History of radiation therapy 11/03/13- 12/19/13    prostate bed 6600 cGy 33 sessions  . 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 Hypertension     Past Surgical History  Procedure Laterality Date  . Retropubic prostatectomy  03/02/2001    Gleason 7  . 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  Visit Diagnosis:  Difficulty walking  Lack of coordination  Muscle weakness (generalized)  Self-care deficit for dressing and grooming  Unsteady gait      Subjective Assessment - 03/08/15 1445    Subjective  Patient reports he is doing better with the exercises, glad the snow is gone now and ready to work.     Pertinent History Patient reports he was recently diagnosed with Parkinson's disease in December 2016.  He reports noticing decreased ability to play golf, his chipping and putting worsened in the last few months.  He recognized decreased coordination skills, right hand tremors and decreased legibility with handwriting.  He reports "scuffing his foot" at times.  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     Patient Stated Goals Patient reports he wants to remain independent with his daily tasks, continue to work part time, wants his walking to be improved and get his golf game back again.   Currently in Pain? No/denies   Pain Score 0-No pain   Multiple Pain Sites No                      OT Treatments/Exercises (OP) - 03/08/15 1446    ADLs   ADL Comments Patient seen for functional component tasks of :  managing small buttons, motion of throwing a ball, sit to stand from a variety of  surfaces, handwriting and hand to mouth patterns for managing utensils.  All performed for 5 repetitions and cues as needed.   Neurological Re-education Exercises   Other Exercises 1 Patient seen for instruction of LSVT BIG exercises: LSVT Daily Session Maximal Daily Exercises: Sustained movements are designed to rescale the amplitude of movement output for generalization to daily functional activities. Performed as follows for 1 set of 10 repetitions each: Multi directional sustained movements- 1) Floor to ceiling, 2) Side to side. Multi directional Repetitive movements performed in standing and are designed to provide retraining effort needed for sustained muscle activation in tasks Performed as follows: 3) Step and reach forward, 4) Step and Reach Backwards, 5) Step and reach sideways, 6)  Rock and reach forward/backward, 7) Rock and reach sideways. Sit to stand from mat table on lowest setting with cues for weight shift, technique and CGA for 5 reps for 2 sets. Patient seen for functional mobility tasks this date with emphasis on gait speed, length of steps with short distance ambulation. Patient requires cues for BIG movements and cadence.All standing exercises performed this date with CGA, verbal and tactile cues as needed.   Other Exercises 2 Functional mobility tasks with gait for 800 feet this date with SBA and cues for amplitude of gait and moderate cues for  reciprocal arm swing. Balance tasks this date standing on unstable surface and tossing ball with right UE at target and then into container., CGA and cues provided as needed.                OT Education - 03/08/15 1449    Education provided Yes   Education Details LSVT BIG exercises, balance tasks, hand flicks   Person(s) Educated Patient   Methods Explanation;Demonstration;Verbal cues   Comprehension Verbalized understanding;Returned demonstration;Verbal cues required             OT Long Term Goals - 03/01/15 2145    OT LONG TERM GOAL #1   Title Patient will improve gait speed and endurance and be able to walk 1750 feet in 6 minutes to negotiate around the home and community safely in 4 weeks.   Time 4   Period Weeks   Status New   OT LONG TERM GOAL #2   Title Patient will complete HEP for maximal daily exercises with modified independence in 4 weeks   Baseline no current program   Time 4   Period Weeks   Status New   OT LONG TERM GOAL #3   Title Patient will transfer from sit to stand without the use of arms safely and independently from a variety of chairs/surfaces in 4 weeks.    Baseline difficulty from low surfaces   Time 4   Status New   OT LONG TERM GOAL #4   Title Patient will be modified independent with home and work tasks including squatting to pick up items from the floor.   Time 4   Period Weeks   Status New   OT LONG TERM GOAL #5   Title Patient will demonstrate 90%legibility with handwriting for name and address with use of normal sized letters.   Time 4   Period Weeks   Status New               Plan - 03/08/15 1450    Clinical Impression Statement Patient continues to progress with exercises, able to complete with CGA and minimal cues for technique and amplitude of movement.  Added finger flicks this date to further advance exercises for UE.  Patient demonstrates difficulty with throwing a ball and being accurate with target reaching.   Handwriting improved from beginning of session to end with BIG movements, lined paper to guide patient on letter size during functional component tasks.   Pt will benefit from skilled therapeutic intervention in order to improve on the following deficits (Retired) Impaired flexibility;Decreased coordination;Decreased mobility;Decreased endurance;Decreased range of motion;Decreased strength;Decreased balance;Difficulty walking;Impaired UE functional use   Rehab Potential Excellent   OT Frequency 4x / week   OT Duration 4 weeks   OT Treatment/Interventions Self-care/ADL training;Therapeutic exercise;Gait Training;Neuromuscular education;Stair Training;Functional Mobility Training;Patient/family education;Therapeutic activities;Balance training   Consulted and Agree with Plan of Care Patient  Problem List Patient Active Problem List   Diagnosis Date Noted  . Essential (primary) hypertension 01/29/2015  . Class 1 obesity 01/29/2015  . H/O malignant neoplasm of prostate 12/15/2014  . 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 Diabetes mellitus, type 2 ([place]) 02/07/2014  . Type 2 diabetes mellitus ([place]) 02/07/2014  . Bladder neck obstruction 01/02/2014  . Bladder retention 01/02/2014  . Abnormal prostate specific antigen 09/29/2011  . Elevated prostate specific antigen (PSA) 09/29/2011  . CA of prostate ([place]) 04/07/2011  . 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 Malignant neoplasm of prostate ([place]) 04/07/2011   Tineshia Becraft T Angelik Walls, OTR/L, CLT  Jason Frisbee 03/09/2015, 2:54 PM  [place] MAIN [facility] SERVICES [address] [place], [place], [address] Phone: [phone]   Fax:  [phone]  Name: Chad Sandoval MRN: [id number] Date of Birth: 1945-10-21

## 2015-03-10 NOTE — Therapy (Signed)
[place] MAIN [facility] SERVICES [address] [place], [place], [address] Phone: [phone]   Fax:  [phone]  Occupational Therapy Treatment  Patient Details  Name: Chad Sandoval MRN: [id number] Date of Birth: 07/07/1945 Referring Provider: Manuella Ghazi  Encounter Date: 03/08/2015      OT End of Session - 03/09/15 1838    Visit Number 6   Number of Visits 17   Date for OT Re-Evaluation 03/27/15   Authorization Type medicare G codes 6   OT Start Time 1100   OT Stop Time 1155   OT Time Calculation (min) 55 min   Activity Tolerance Patient tolerated treatment well   Behavior During Therapy [facility] for tasks assessed/performed      Past Medical History  Diagnosis Date  . Prostate cancer [facility]) 2003    s/p radical prostatectomy  . Thrombocytopenia ([place])   . History of radiation therapy 11/03/13- 12/19/13    prostate bed 6600 cGy 33 sessions  . 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 Hypertension     Past Surgical History  Procedure Laterality Date  . Retropubic prostatectomy  03/02/2001    Gleason 7  . Splenectomy  04/2012    [facility]    There were no vitals filed for this visit.  Visit Diagnosis:  Muscle weakness (generalized)  Self-care deficit for dressing and grooming  Lack of coordination      Subjective Assessment - 03/09/15 1836    Subjective  Patient reports he has to work over the weekend at the golf [place] but is planning to still do his exercises 2 times a day.  no pain this date reported .   Pertinent History Patient reports he was recently diagnosed with Parkinson's disease in December 2016.  He reports noticing decreased ability to play golf, his chipping and putting worsened in the last few months.  He recognized decreased coordination skills, right hand tremors and decreased legibility with handwriting.  He reports "scuffing his foot" at times.  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     Patient Stated Goals Patient reports he wants to remain independent with his daily tasks, continue to work part time, wants his walking to be improved and get his golf game back again.   Currently in Pain? No/denies   Pain Score 0-No pain   Multiple Pain Sites No                      OT Treatments/Exercises (OP) - 03/09/15 1841    ADLs   ADL Comments Patient seen for functional component tasks of : managing small buttons, motion of throwing a ball, sit to stand from a variety of surfaces, handwriting and hand to mouth patterns for managing utensils. All performed for 5 repetitions and cues as needed   Neurological Re-education Exercises   Other Exercises 1 Patient seen for instruction of LSVT BIG exercises: LSVT Daily Session Maximal Daily Exercises: Sustained movements are designed to rescale the amplitude of movement output for generalization to daily functional activities. Performed as follows for 1 set of 10 repetitions each: Multi directional sustained movements- 1) Floor to ceiling, 2) Side to side. Multi directional Repetitive movements performed in standing and are designed to provide retraining effort needed for sustained muscle activation in tasks Performed as follows: 3) Step and reach forward, 4) Step and Reach Backwards, 5) Step and reach  sideways, 6) Rock and reach forward/backward, 7) Rock and reach sideways. Sit to stand from mat table on lowest setting with cues for weight shift, technique and CGA for 5 reps for 2 sets. Patient seen for functional mobility tasks this date with emphasis on gait speed, length of steps with short distance ambulation. Patient requires cues for BIG movements and cadence.All standing exercises performed this date with SBA, verbal and tactile cues as needed.   Other Exercises 2 Functional mobility tasks with gait for 800 feet this date with SBA and cues for amplitude of gait and moderate cues for  reciprocal arm swing. Balance tasks this date standing on unstable surface and tossing ball with right UE at target and then into container., CGA and cues provided as needed.                OT Education - 03/09/15 1837    Education provided Yes   Education Details LSVT BIG exercises, balance tasks   Person(s) Educated Patient   Methods Explanation;Demonstration;Verbal cues   Comprehension Verbal cues required;Returned demonstration;Verbalized understanding             OT Long Term Goals - 03/01/15 2145    OT LONG TERM GOAL #1   Title Patient will improve gait speed and endurance and be able to walk 1750 feet in 6 minutes to negotiate around the home and community safely in 4 weeks.   Time 4   Period Weeks   Status New   OT LONG TERM GOAL #2   Title Patient will complete HEP for maximal daily exercises with modified independence in 4 weeks   Baseline no current program   Time 4   Period Weeks   Status New   OT LONG TERM GOAL #3   Title Patient will transfer from sit to stand without the use of arms safely and independently from a variety of chairs/surfaces in 4 weeks.    Baseline difficulty from low surfaces   Time 4   Status New   OT LONG TERM GOAL #4   Title Patient will be modified independent with home and work tasks including squatting to pick up items from the floor.   Time 4   Period Weeks   Status New   OT LONG TERM GOAL #5   Title Patient will demonstrate 90%legibility with handwriting for name and address with use of normal sized letters.   Time 4   Period Weeks   Status New               Plan - 03/09/15 1838    Clinical Impression Statement Patient has continued to make good progress this week in all areas, he is consistent with performing exercises 2 times a day and moving towards performing with SBA and cues in the clinic.  Additional focus on functional component tasks this date especially with oeverhead ball throwing and handwriting  tasks.    Pt will benefit from skilled therapeutic intervention in order to improve on the following deficits (Retired) Impaired flexibility;Decreased coordination;Decreased mobility;Decreased endurance;Decreased range of motion;Decreased strength;Decreased balance;Difficulty walking;Impaired UE functional use   Rehab Potential Excellent   OT Frequency 4x / week   OT Duration 4 weeks   OT Treatment/Interventions Self-care/ADL training;Therapeutic exercise;Gait Training;Neuromuscular education;Stair Training;Functional Mobility Training;Patient/family education;Therapeutic activities;Balance training   Consulted and Agree with Plan of Care Patient        Problem List Patient Active Problem List   Diagnosis Date Noted  . Essential (primary) hypertension 01/29/2015  .  Class 1 obesity 01/29/2015  . H/O malignant neoplasm of prostate 12/15/2014  . 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 Diabetes mellitus, type 2 ([place]) 02/07/2014  . Type 2 diabetes mellitus ([place]) 02/07/2014  . Bladder neck obstruction 01/02/2014  . Bladder retention 01/02/2014  . Abnormal prostate specific antigen 09/29/2011  . Elevated prostate specific antigen (PSA) 09/29/2011  . CA of prostate ([place]) 04/07/2011  . 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 Malignant neoplasm of prostate [facility]) 04/07/2011   Xavi Tomasik T Wynona Duhamel, OTR/L, CLT  Kelli Robeck 03/10/2015, 6:43 PM  [place] MAIN [facility] SERVICES [address] [place], [place], [address] Phone: [phone]   Fax:  [phone]  Name: Chad Sandoval MRN: [id number] Date of Birth: October 27, 1945

## 2015-03-10 NOTE — Therapy (Signed)
[place] MAIN [facility] SERVICES [address] [place], [place], [address] Phone: [phone]   Fax:  [phone]  Occupational Therapy Treatment  Patient Details  Name: Chad Sandoval MRN: [id number] Date of Birth: 1945-09-13 Referring Provider: Manuella Ghazi  Encounter Date: 03/09/2015      OT End of Session - 03/10/15 1925    Visit Number 7   Number of Visits 17   Date for OT Re-Evaluation 03/27/15   Authorization Type medicare G codes 7   OT Start Time 1100   OT Stop Time 1154   OT Time Calculation (min) 54 min   Activity Tolerance Patient tolerated treatment well   Behavior During Therapy [facility] for tasks assessed/performed      Past Medical History  Diagnosis Date  . Prostate cancer [facility]) 2003    s/p radical prostatectomy  . Thrombocytopenia ([place])   . History of radiation therapy 11/03/13- 12/19/13    prostate bed 6600 cGy 33 sessions  . 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 Hypertension     Past Surgical History  Procedure Laterality Date  . Retropubic prostatectomy  03/02/2001    Gleason 7  . Splenectomy  04/2012    [facility]    There were no vitals filed for this visit.  Visit Diagnosis:  Muscle weakness (generalized)  Self-care deficit for dressing and grooming  Lack of coordination  Difficulty walking  Unsteady gait      Subjective Assessment - 03/10/15 1922    Subjective  Patient planning to work saturday and sunday, no complaints this date, feels he is doing well.    Pertinent History Patient reports he was recently diagnosed with Parkinson's disease in December 2016.  He reports noticing decreased ability to play golf, his chipping and putting worsened in the last few months.  He recognized decreased coordination skills, right hand tremors and decreased legibility with handwriting.  He reports "scuffing his foot" at times.  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     Patient Stated Goals Patient reports he wants to remain independent with his daily tasks, continue to work part time, wants his walking to be improved and get his golf game back again.   Currently in Pain? No/denies   Pain Score 0-No pain                      OT Treatments/Exercises (OP) - 03/09/15 1924    ADLs   ADL Comments Patient seen for functional component tasks of : managing small buttons, motion of throwing a ball, sit to stand from a variety of surfaces, handwriting and hand to mouth patterns for managing utensils. All performed for 5 repetitions and cues as needed   Neurological Re-education Exercises   Other Exercises 1 Patient seen for instruction of LSVT BIG exercises: LSVT Daily Session Maximal Daily Exercises: Sustained movements are designed to rescale the amplitude of movement output for generalization to daily functional activities. Performed as follows for 1 set of 10 repetitions each: Multi directional sustained movements- 1) Floor to ceiling, 2) Side to side. Multi directional Repetitive movements performed in standing and are designed to provide retraining effort needed for sustained muscle activation in tasks Performed as follows: 3) Step and reach forward, 4) Step and Reach Backwards, 5) Step and reach sideways, 6) Rock and reach forward/backward, 7) Rock and reach sideways. Sit to stand from  mat table on lowest setting with cues for weight shift, technique and CGA for 5 reps for 2 sets. Patient seen for functional mobility tasks this date with emphasis on gait speed, length of steps with short distance ambulation. Patient requires cues for BIG movements and cadence.All standing exercises performed this date with SBA, verbal and tactile cues as needed.   Other Exercises 2 Functional mobility tasks with gait for 1000 feet this date with SBA and cues for amplitude of gait and moderate cues for reciprocal arm swing. Balance tasks this date  standing on unstable surface and tossing ball with right UE at target and then into container., CGA and cues provided as needed  Toe taps on stairs, up and down stairs with supervision and cues for technique,  Balance tasks on trampoline this date with CGA for safety.                OT Education - 03/10/15 1925    Education provided Yes   Education Details HEP   Person(s) Educated Patient   Methods Explanation;Demonstration;Verbal cues   Comprehension Verbal cues required;Returned demonstration;Verbalized understanding             OT Long Term Goals - 03/01/15 2145    OT LONG TERM GOAL #1   Title Patient will improve gait speed and endurance and be able to walk 1750 feet in 6 minutes to negotiate around the home and community safely in 4 weeks.   Time 4   Period Weeks   Status New   OT LONG TERM GOAL #2   Title Patient will complete HEP for maximal daily exercises with modified independence in 4 weeks   Baseline no current program   Time 4   Period Weeks   Status New   OT LONG TERM GOAL #3   Title Patient will transfer from sit to stand without the use of arms safely and independently from a variety of chairs/surfaces in 4 weeks.    Baseline difficulty from low surfaces   Time 4   Status New   OT LONG TERM GOAL #4   Title Patient will be modified independent with home and work tasks including squatting to pick up items from the floor.   Time 4   Period Weeks   Status New   OT LONG TERM GOAL #5   Title Patient will demonstrate 90%legibility with handwriting for name and address with use of normal sized letters.   Time 4   Period Weeks   Status New               Plan - 03/10/15 1926    Clinical Impression Statement Patient has progressed to performing exercises with SBA to supervision this week.  CGA for balance tasks on unstable surfaces.  He continues to respond well to cues for amplitude of movement for gait and reciprocal arm swing as well as during  exercises.  Continues to benefit from intensive LSVT BIG program.   Rehab Potential Excellent   OT Frequency 4x / week   OT Duration 4 weeks   OT Treatment/Interventions Self-care/ADL training;Therapeutic exercise;Gait Training;Neuromuscular education;Stair Training;Functional Mobility Training;Patient/family education;Therapeutic activities;Balance training   Consulted and Agree with Plan of Care Patient        Problem List Patient Active Problem List   Diagnosis Date Noted  . Essential (primary) hypertension 01/29/2015  . Class 1 obesity 01/29/2015  . H/O malignant neoplasm of prostate 12/15/2014  . 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  Diabetes mellitus, type 2 ([place]) 02/07/2014  . Type 2 diabetes mellitus ([place]) 02/07/2014  . Bladder neck obstruction 01/02/2014  . Bladder retention 01/02/2014  . Abnormal prostate specific antigen 09/29/2011  . Elevated prostate specific antigen (PSA) 09/29/2011  . CA of prostate ([place]) 04/07/2011  . 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 Malignant neoplasm of prostate [facility]) 04/07/2011   Rhilyn Battle T Camillo Quadros, OTR/L, CLT  Tarun Patchell 03/10/2015, 7:29 PM  [place] MAIN [facility] SERVICES [address] [place], [place], [address] Phone: [phone]   Fax:  [phone]  Name: DASHELL DELEONE MRN: [id number] Date of Birth: 1946/01/13

## 2015-03-12 ENCOUNTER — Encounter: Payer: Medicare Other | Admitting: Occupational Therapy

## 2015-03-12 ENCOUNTER — Encounter: Payer: Self-pay | Admitting: Speech Pathology

## 2015-03-12 ENCOUNTER — Ambulatory Visit: Payer: Medicare Other | Admitting: Speech Pathology

## 2015-03-12 ENCOUNTER — Ambulatory Visit: Payer: Medicare Other | Admitting: Occupational Therapy

## 2015-03-12 ENCOUNTER — Encounter: Payer: Medicare Other | Admitting: Speech Pathology

## 2015-03-12 DIAGNOSIS — R460 Very low level of personal hygiene: Secondary | ICD-10-CM

## 2015-03-12 DIAGNOSIS — R279 Unspecified lack of coordination: Secondary | ICD-10-CM

## 2015-03-12 DIAGNOSIS — R490 Dysphonia: Secondary | ICD-10-CM

## 2015-03-12 DIAGNOSIS — R2681 Unsteadiness on feet: Secondary | ICD-10-CM

## 2015-03-12 DIAGNOSIS — Z741 Need for assistance with personal care: Secondary | ICD-10-CM

## 2015-03-12 DIAGNOSIS — R262 Difficulty in walking, not elsewhere classified: Secondary | ICD-10-CM

## 2015-03-12 DIAGNOSIS — M6281 Muscle weakness (generalized): Secondary | ICD-10-CM

## 2015-03-12 NOTE — Therapy (Signed)
[place] MAIN [facility] SERVICES [address] [place], [place], [address] Phone: [phone]   Fax:  [phone]  Speech Language Pathology Treatment  Patient Details  Name: Chad Sandoval MRN: [id number] Date of Birth: [birth year] Referring Provider: Dr. Manuella Ghazi  Encounter Date: 03/12/2015      End of Session - 03/12/15 1055    Visit Number 8   Number of Visits 17   Date for SLP Re-Evaluation 03/30/15   SLP Start Time 1000   SLP Stop Time  1048   SLP Time Calculation (min) 48 min      Past Medical History  Diagnosis Date  . Prostate cancer [facility]) 2003    s/p radical prostatectomy  . Thrombocytopenia ([place])   . History of radiation therapy 11/03/13- 12/19/13    prostate bed 6600 cGy 33 sessions  . 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 Hypertension     Past Surgical History  Procedure Laterality Date  . Retropubic prostatectomy  03/02/2001    Gleason 7  . 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   Currently in Pain? No/denies               ADULT SLP TREATMENT - 03/12/15 0001    General Information   Behavior/Cognition Alert;Cooperative;Pleasant mood   HPI Parkinson's disease   Treatment Provided   Treatment provided Cognitive-Linquistic   Pain Assessment   Pain Assessment No/denies pain   Cognitive-Linquistic Treatment   Treatment focused on Voice   Skilled Treatment Daily Task #1: Average 20 seconds, 85 dB. Daily Task 2: Highs: 15 high pitched "ah" given no cues. Lows: 15 low pitched "ah" given min cues. Daily task #3: Average 75 dB.  Hierarchal speech loudness drill: Read paragraphs, 73 dB.  Generate longer responses, 75 dB.  Homework: assignments completed.  Off the cuff  remarks: average 70 dB.   Assessment / Recommendations / Plan   Plan Continue with current plan of care   Progression Toward Goals   Progression toward goals Progressing toward goals          SLP Education - 03/12/15 1054    Education provided Yes   Education Details LSVT-LOUD   Person(s) Educated Patient   Methods Explanation;Demonstration;Verbal cues;Handout   Comprehension Verbalized understanding;Returned demonstration;Verbal cues required;Need further instruction            SLP Long Term Goals - 02/27/15 1025    SLP LONG TERM GOAL #1   Title The patient will complete Daily Tasks (Maximum duration "ah", High/Lows, and Functional Phrases) at average loudness of 80 dB and with loud, good quality voice.    Time 4   Period Weeks   Status New   SLP LONG TERM GOAL #2   Title The patient will complete Hierarchal Speech Loudness reading drills (words/phrases, sentences, and paragraph) at average 75 dB and with loud, good quality voice.     Time 4   Period Weeks   Status New   SLP LONG TERM GOAL #3   Title The patient will complete homework daily.   Time 4   Period Weeks   Status New   SLP LONG TERM GOAL #4   Title The patient  will participate in conversation, maintaining average loudness of 75 dB and loud, good quality voice.   Time 4   Period Weeks   Status New          Plan - 03/12/15 1055    Clinical Impression Statement The patient is completing daily tasks and hierarchal speech drill tasks with loud, good quality voice given min SLP cues.  The patient is exhibiting increased facial gestures. He is demonstrating increasing generalization into conversational speech.   Speech Therapy Frequency 4x / week   Duration 4 weeks   Treatment/Interventions Other (comment)  LSVT-LOUD   Potential to Achieve Goals Good   Potential Considerations Ability to learn/carryover information;Cooperation/participation level;Previous level of function;Severity of  impairments;Family/community support   SLP Home Exercise Plan LSVT-LOUD daily homework   Consulted and Agree with Plan of Care Patient        Problem List Patient Active Problem List   Diagnosis Date Noted  . Essential (primary) hypertension 01/29/2015  . Class 1 obesity 01/29/2015  . H/O malignant neoplasm of prostate 12/15/2014  . 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 Diabetes mellitus, type 2 ([place]) 02/07/2014  . Type 2 diabetes mellitus ([place]) 02/07/2014  . Bladder neck obstruction 01/02/2014  . Bladder retention 01/02/2014  . Abnormal prostate specific antigen 09/29/2011  . Elevated prostate specific antigen (PSA) 09/29/2011  . CA of prostate ([place]) 04/07/2011  . 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 Malignant neoplasm of prostate ([place]) 04/07/2011   [name], MS/CCC- SLP  Lou Miner 03/12/2015, 10:56 AM  [place] MAIN [facility] SERVICES [address] [place], [place], [address] Phone: [phone]   Fax:  [phone]   Name: VITOLD LAPIETRA MRN: [id number] Date of Birth: 1945/08/10

## 2015-03-13 ENCOUNTER — Encounter: Payer: Self-pay | Admitting: Speech Pathology

## 2015-03-13 ENCOUNTER — Ambulatory Visit: Payer: Medicare Other | Admitting: Speech Pathology

## 2015-03-13 ENCOUNTER — Encounter: Payer: Medicare Other | Admitting: Speech Pathology

## 2015-03-13 ENCOUNTER — Ambulatory Visit: Payer: Medicare Other | Admitting: Occupational Therapy

## 2015-03-13 ENCOUNTER — Encounter: Payer: Medicare Other | Admitting: Occupational Therapy

## 2015-03-13 DIAGNOSIS — R262 Difficulty in walking, not elsewhere classified: Secondary | ICD-10-CM

## 2015-03-13 DIAGNOSIS — Z741 Need for assistance with personal care: Secondary | ICD-10-CM

## 2015-03-13 DIAGNOSIS — R460 Very low level of personal hygiene: Secondary | ICD-10-CM

## 2015-03-13 DIAGNOSIS — R2681 Unsteadiness on feet: Secondary | ICD-10-CM

## 2015-03-13 DIAGNOSIS — R490 Dysphonia: Secondary | ICD-10-CM | POA: Diagnosis not present

## 2015-03-13 DIAGNOSIS — R279 Unspecified lack of coordination: Secondary | ICD-10-CM

## 2015-03-13 DIAGNOSIS — M6281 Muscle weakness (generalized): Secondary | ICD-10-CM

## 2015-03-13 NOTE — Therapy (Signed)
Chad Sandoval [facility] SERVICES [address] [place], [place], [address] Phone: [phone]   Fax:  [phone]  Speech Language Pathology Treatment  Patient Details  Name: Chad Sandoval MRN: [id number] Date of Birth: 10-31-1945 Referring Provider: Dr. Manuella Ghazi  Encounter Date: 03/13/2015      End of Session - 03/13/15 1216    Visit Number 9   Number of Visits 17   Date for SLP Re-Evaluation 03/30/15   SLP Start Time 15   SLP Stop Time  1152   SLP Time Calculation (min) 52 min   Activity Tolerance Patient tolerated treatment well      Past Medical History  Diagnosis Date  . Prostate cancer [facility]) 2003    s/p radical prostatectomy  . Thrombocytopenia ([place])   . History of radiation therapy 11/03/13- 12/19/13    prostate bed 6600 cGy 33 sessions  . 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 Hypertension     Past Surgical History  Procedure Laterality Date  . Retropubic prostatectomy  03/02/2001    [facility]leason 7  . 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   Currently in Pain? No/denies               ADULT SLP TREATMENT - 03/13/15 0001    General Information   Behavior/Cognition Alert;Cooperative;Pleasant mood   HPI Parkinson's disease   Treatment Provided   Treatment provided Cognitive-Linquistic   Pain Assessment   Pain Assessment No/denies pain   Cognitive-Linquistic Treatment   Treatment focused on Voice   Skilled Treatment Daily Task #1: Average 20 seconds, 85 dB. Daily Task 2: Highs: 15 high pitched "ah" given no cues. Lows: 15 low pitched "ah" given min cues. Daily task #3: Average 75 dB.  Hierarchal speech loudness drill: Read paragraphs, 73 dB.  Generate longer responses,  75 dB.  Homework: assignments completed.  Off the cuff remarks: average 70 dB.   Assessment / Recommendations / Plan   Plan Continue with current plan of care   Progression Toward Goals   Progression toward goals Progressing toward goals          SLP Education - 03/13/15 1216    Education provided Yes   Education Details LSVT-LOUD   Person(s) Educated Patient   Methods Explanation;Demonstration;Verbal cues;Handout   Comprehension Verbalized understanding;Returned demonstration;Verbal cues required;Need further instruction            SLP Long Term Goals - 02/27/15 1025    SLP LONG TERM GOAL #1   Title The patient will complete Daily Tasks (Maximum duration "ah", High/Lows, and Functional Phrases) at average loudness of 80 dB and with loud, good quality voice.    Time 4   Period Weeks   Status New   SLP LONG TERM GOAL #2   Title The patient will complete Hierarchal Speech Loudness reading drills (words/phrases, sentences, and paragraph) at average 75 dB and with loud, good quality voice.     Time 4   Period Weeks   Status New   SLP LONG TERM GOAL #3   Title The patient will complete homework daily.   Time 4   Period Weeks   Status New   SLP LONG  TERM GOAL #4   Title The patient will participate in conversation, maintaining average loudness of 75 dB and loud, good quality voice.   Time 4   Period Weeks   Status New          Plan - 03/13/15 1218    Clinical Impression Statement The patient is completing daily tasks and hierarchal speech drill tasks with loud, good quality voice given min SLP cues.  The patient is exhibiting increased facial gestures. He is demonstrating increasing generalization into conversational speech.   Speech Therapy Frequency 4x / week   Duration 4 weeks   Treatment/Interventions Other (comment)  LSVT-LOUD   Potential to Achieve Goals Good   Potential Considerations Ability to learn/carryover information;Cooperation/participation  level;Previous level of function;Severity of impairments;Family/community support   SLP Home Exercise Plan LSVT-LOUD daily homework   Consulted and Agree with Plan of Care Patient        Problem List Patient Active Problem List   Diagnosis Date Noted  . Essential (primary) hypertension 01/29/2015  . Class 1 obesity 01/29/2015  . H/O malignant neoplasm of prostate 12/15/2014  . 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 Diabetes mellitus, type 2 ([place]) 02/07/2014  . Type 2 diabetes mellitus ([place]) 02/07/2014  . Bladder neck obstruction 01/02/2014  . Bladder retention 01/02/2014  . Abnormal prostate specific antigen 09/29/2011  . Elevated prostate specific antigen (PSA) 09/29/2011  . CA of prostate ([place]) 04/07/2011  . 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 Malignant neoplasm of prostate ([place]) 04/07/2011   Chad Sea, MS/CCC- SLP  Chad Sandoval 03/13/2015, 12:19 PM  [place] [name] SERVICES [address] [place], [place], [address] Phone: [phone]   Fax:  [phone]   Name: Chad Sandoval MRN: [id number] Date of Birth: 1945-08-19

## 2015-03-14 ENCOUNTER — Encounter: Payer: Medicare Other | Admitting: Occupational Therapy

## 2015-03-14 ENCOUNTER — Ambulatory Visit: Payer: Medicare Other | Admitting: Speech Pathology

## 2015-03-14 ENCOUNTER — Encounter: Payer: Self-pay | Admitting: Speech Pathology

## 2015-03-14 ENCOUNTER — Ambulatory Visit: Payer: Medicare Other | Admitting: Occupational Therapy

## 2015-03-14 ENCOUNTER — Encounter: Payer: Medicare Other | Admitting: Speech Pathology

## 2015-03-14 DIAGNOSIS — R490 Dysphonia: Secondary | ICD-10-CM | POA: Diagnosis not present

## 2015-03-14 DIAGNOSIS — Z741 Need for assistance with personal care: Secondary | ICD-10-CM

## 2015-03-14 DIAGNOSIS — R279 Unspecified lack of coordination: Secondary | ICD-10-CM

## 2015-03-14 DIAGNOSIS — R2681 Unsteadiness on feet: Secondary | ICD-10-CM

## 2015-03-14 DIAGNOSIS — R262 Difficulty in walking, not elsewhere classified: Secondary | ICD-10-CM

## 2015-03-14 DIAGNOSIS — R460 Very low level of personal hygiene: Secondary | ICD-10-CM

## 2015-03-14 DIAGNOSIS — M6281 Muscle weakness (generalized): Secondary | ICD-10-CM

## 2015-03-14 NOTE — Therapy (Signed)
[place] MAIN [facility] SERVICES [address] [place], [place], [address] Phone: [phone]   Fax:  [phone]  Speech Language Pathology Treatment/Progress Note  Patient Details  Name: Chad Sandoval MRN: [id number] Date of Birth: 1945/11/16 Referring Provider: Dr. Manuella Ghazi  Encounter Date: 03/14/2015      End of Session - 03/14/15 1052    Visit Number 10   Number of Visits 17   Date for SLP Re-Evaluation 03/30/15   SLP Start Time 1000   SLP Stop Time  1050   SLP Time Calculation (min) 50 min   Activity Tolerance Patient tolerated treatment well      Past Medical History  Diagnosis Date  . Prostate cancer [facility]) 2003    s/p radical prostatectomy  . Thrombocytopenia ([place])   . History of radiation therapy 11/03/13- 12/19/13    prostate bed 6600 cGy 33 sessions  . 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 Hypertension     Past Surgical History  Procedure Laterality Date  . Retropubic prostatectomy  03/02/2001    Gleason 7  . 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   Currently in Pain? No/denies               ADULT SLP TREATMENT - 03/14/15 0001    General Information   Behavior/Cognition Alert;Cooperative;Pleasant mood   HPI Parkinson's disease   Treatment Provided   Treatment provided Cognitive-Linquistic   Pain Assessment   Pain Assessment No/denies pain   Cognitive-Linquistic Treatment   Treatment focused on Voice   Skilled Treatment Daily Task #1: Average 21 seconds, 85 dB. Daily Task 2: Highs: 15 high pitched "ah" given no cues. Lows: 15 low pitched "ah" given min cues. Daily task #3: Average 75 dB.  Hierarchal speech loudness drill: Read paragraphs, 75 dB.  Generate  longer responses, 75 dB.  Homework: assignments completed.  Off the cuff remarks: average 70 dB.   Assessment / Recommendations / Plan   Plan Continue with current plan of care;Goals updated   Progression Toward Goals   Progression toward goals Progressing toward goals          SLP Education - 03/14/15 1051    Education provided Yes   Education Details LSVT-LOUD   Person(s) Educated Patient   Methods Explanation;Demonstration;Verbal cues;Handout   Comprehension Verbalized understanding;Returned demonstration;Verbal cues required;Need further instruction            SLP Long Term Goals - 03/14/15 1053    SLP LONG TERM GOAL #1   Title The patient will complete Daily Tasks (Maximum duration "ah", High/Lows, and Functional Phrases) at average loudness of 80 dB and with loud, good quality voice.    Time 4   Period Weeks   Status Partially Met   SLP LONG TERM GOAL #2   Title The patient will complete Hierarchal Speech Loudness reading drills (words/phrases, sentences, and paragraph) at average 75 dB and with loud, good quality voice.     Time 4   Period Weeks   Status Partially Met   SLP LONG TERM GOAL #3   Title The patient will complete homework daily.   Time 4   Period Weeks   Status On-going  SLP LONG TERM GOAL #4   Title The patient will participate in conversation, maintaining average loudness of 75 dB and loud, good quality voice.   Time 4   Period Weeks   Status Partially Met          Plan - Apr 01, 2015 1052    Clinical Impression Statement The patient is completing daily tasks and hierarchal speech drill tasks with loud, good quality voice. The patient is exhibiting increased facial gestures. He is demonstrating increasing generalization into conversational speech.   Speech Therapy Frequency 4x / week   Duration 4 weeks   Treatment/Interventions Other (comment)  LSVT-LOUD   Potential to Achieve Goals Good   Potential Considerations Ability to learn/carryover  information;Cooperation/participation level;Previous level of function;Severity of impairments;Family/community support   SLP Home Exercise Plan LSVT-LOUD daily homework   Consulted and Agree with Plan of Care Patient          G-Codes - 2015/04/01 1054    Functional Assessment Tool Used LSVT-LOUD protocol   Functional Limitations Voice   Voice Current Status (G9171) At least 20 percent but less than 40 percent impaired, limited or restricted   Voice Goal Status (D3570) At least 1 percent but less than 20 percent impaired, limited or restricted      Problem List Patient Active Problem List   Diagnosis Date Noted  . Essential (primary) hypertension 01/29/2015  . Class 1 obesity 01/29/2015  . H/O malignant neoplasm of prostate 12/15/2014  . 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 Diabetes mellitus, type 2 ([place]) 02/07/2014  . Type 2 diabetes mellitus ([place]) 02/07/2014  . Bladder neck obstruction 01/02/2014  . Bladder retention 01/02/2014  . Abnormal prostate specific antigen 09/29/2011  . Elevated prostate specific antigen (PSA) 09/29/2011  . CA of prostate ([place]) 04/07/2011  . 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 Malignant neoplasm of prostate ([place]) 04/07/2011   Chad Sea, MS/CCC- SLP  Chad Sandoval 2015/04/01, 10:55 AM  [place] MAIN [facility] SERVICES [address] [place], [place], [address] Phone: [phone]   Fax:  [phone]   Name: Chad Sandoval MRN: [id number] Date of Birth: 1945/12/19

## 2015-03-14 NOTE — Therapy (Signed)
[place] MAIN [facility] SERVICES [address] [place], [place], [address] Phone: [phone]   Fax:  [phone]  Occupational Therapy Treatment  Patient Details  Name: Chad Sandoval MRN: [id number] Date of Birth: August 23, 1945 Referring Provider: Manuella Ghazi  Encounter Date: 03/12/2015      OT End of Session - 03/13/15 1737    Visit Number 8   Number of Visits 17   Date for OT Re-Evaluation 03/27/15   Authorization Type medicare G codes 8   OT Start Time 1101   OT Stop Time 1155   OT Time Calculation (min) 54 min   Activity Tolerance Patient tolerated treatment well   Behavior During Therapy [facility] for tasks assessed/performed      Past Medical History  Diagnosis Date  . Prostate cancer [facility]) 2003    s/p radical prostatectomy  . Thrombocytopenia ([place])   . History of radiation therapy 11/03/13- 12/19/13    prostate bed 6600 cGy 33 sessions  . 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 Hypertension     Past Surgical History  Procedure Laterality Date  . Retropubic prostatectomy  03/02/2001    Gleason 7  . 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  Visit Diagnosis:  Muscle weakness (generalized)  Self-care deficit for dressing and grooming  Lack of coordination  Difficulty walking  Unsteady gait      Subjective Assessment - 03/13/15 1734    Subjective  Patient reports he worked the weekend, did well but was only able to complete one set of exercises per day.  Discussed the recommendation for 2 times a day during the intensive phase of therapy.     Pertinent History Patient reports he was recently diagnosed with Parkinson's disease in December 2016.  He reports noticing decreased ability to play golf, his chipping and putting worsened in the last few months.  He recognized decreased coordination skills, right hand tremors and decreased legibility with handwriting.  He reports  "scuffing his foot" at times.  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     Patient Stated Goals Patient reports he wants to remain independent with his daily tasks, continue to work part time, wants his walking to be improved and get his golf game back again.   Currently in Pain? No/denies   Pain Score 0-No pain                      OT Treatments/Exercises (OP) - 03/13/15 1735    ADLs   ADL Comments Patient seen for functional component tasks of : managing small buttons, motion of throwing a ball, sit to stand from a variety of surfaces, handwriting and hand to mouth patterns for managing utensils. All performed for 5 repetitions and cues as needed   Neurological Re-education Exercises   Other Exercises 1 Patient seen for instruction of LSVT BIG exercises: LSVT Daily Session Maximal Daily Exercises: Sustained movements are designed to rescale the amplitude of movement output for generalization to daily functional activities. Performed as follows for 1 set of 10 repetitions each: Multi directional sustained movements- 1) Floor to ceiling, 2) Side to side. Multi directional Repetitive movements performed in standing and are designed to provide retraining effort needed for sustained muscle activation in tasks Performed as follows: 3) Step and reach forward, 4) Step and Reach  Backwards, 5) Step and reach sideways, 6) Rock and reach forward/backward, 7) Rock and reach sideways. Sit to stand from mat table on lowest setting with cues for weight shift, technique and CGA for 5 reps for 2 sets. Patient seen for functional mobility tasks this date with emphasis on gait speed, length of steps with short distance ambulation. Patient requires cues for BIG movements and cadence.All standing exercises performed this date with SBA, verbal and tactile cues as needed.   Other Exercises 2 Functional mobility tasks with gait for 1000 feet this date with  SBA and cues for amplitude of gait and moderate cues for reciprocal arm swing. Balance tasks this date standing on unstable surface and tossing ball with right UE at target and then into container., CGA and cues provided as needed Toe taps on stairs, up and down stairs with supervision and cues for technique, Balance tasks on balance pad this date with SBA.                OT Education - 03/13/15 1736    Education provided Yes   Education Details LSVT BIG maximal daily exercises, functional component tasks.   Person(s) Educated Patient   Methods Explanation;Demonstration;Verbal cues   Comprehension Verbal cues required;Returned demonstration;Verbalized understanding             OT Long Term Goals - 03/01/15 2145    OT LONG TERM GOAL #1   Title Patient will improve gait speed and endurance and be able to walk 1750 feet in 6 minutes to negotiate around the home and community safely in 4 weeks.   Time 4   Period Weeks   Status New   OT LONG TERM GOAL #2   Title Patient will complete HEP for maximal daily exercises with modified independence in 4 weeks   Baseline no current program   Time 4   Period Weeks   Status New   OT LONG TERM GOAL #3   Title Patient will transfer from sit to stand without the use of arms safely and independently from a variety of chairs/surfaces in 4 weeks.    Baseline difficulty from low surfaces   Time 4   Status New   OT LONG TERM GOAL #4   Title Patient will be modified independent with home and work tasks including squatting to pick up items from the floor.   Time 4   Period Weeks   Status New   OT LONG TERM GOAL #5   Title Patient will demonstrate 90%legibility with handwriting for name and address with use of normal sized letters.   Time 4   Period Weeks   Status New               Plan - 03/13/15 1737    Clinical Impression Statement Patient continues to progress with task, increasing complexity of tasks, added finger flicks  to many of exercises.  Progressing balance tasks this date.  Continues to demo difficulty with RUE ball throwing motions and impaired coordination to hit a target.    Pt will benefit from skilled therapeutic intervention in order to improve on the following deficits (Retired) Impaired flexibility;Decreased coordination;Decreased mobility;Decreased endurance;Decreased range of motion;Decreased strength;Decreased balance;Difficulty walking;Impaired UE functional use   Rehab Potential Excellent   OT Frequency 4x / week   OT Duration 4 weeks   OT Treatment/Interventions Self-care/ADL training;Therapeutic exercise;Gait Training;Neuromuscular education;Stair Training;Functional Mobility Training;Patient/family education;Therapeutic activities;Balance training   Consulted and Agree with Plan of Care Patient  Problem List Patient Active Problem List   Diagnosis Date Noted  . Essential (primary) hypertension 01/29/2015  . Class 1 obesity 01/29/2015  . H/O malignant neoplasm of prostate 12/15/2014  . 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 Diabetes mellitus, type 2 ([place]) 02/07/2014  . Type 2 diabetes mellitus ([place]) 02/07/2014  . Bladder neck obstruction 01/02/2014  . Bladder retention 01/02/2014  . Abnormal prostate specific antigen 09/29/2011  . Elevated prostate specific antigen (PSA) 09/29/2011  . CA of prostate ([place]) 04/07/2011  . 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 Malignant neoplasm of prostate [facility]) 04/07/2011   Leilanie Rauda T Arnola Crittendon, OTR/L, CLT  Tamalyn Wadsworth 03/14/2015, 5:40 PM  [place] MAIN [facility] SERVICES [address] [place], [place], [address] Phone: [phone]   Fax:  [phone]  Name: Chad Sandoval MRN: [id number] Date of Birth: 1945-08-16

## 2015-03-14 NOTE — Therapy (Signed)
[place] MAIN [facility] SERVICES [address] [place], [place], [address] Phone: [phone]   Fax:  [phone]  Occupational Therapy Treatment  Patient Details  Name: Chad Sandoval MRN: [id number] Date of Birth: September 05, 1945 Referring Provider: Manuella Ghazi  Encounter Date: 03/13/2015      OT End of Session - 03/13/15 1831    Visit Number 9   Number of Visits 17   Date for OT Re-Evaluation 03/27/15   Authorization Type medicare G codes 9   OT Start Time 1100   OT Stop Time 1147   OT Time Calculation (min) 47 min   Activity Tolerance Patient tolerated treatment well   Behavior During Therapy [facility] for tasks assessed/performed      Past Medical History  Diagnosis Date  . Prostate cancer [facility]) 2003    s/p radical prostatectomy  . Thrombocytopenia ([place])   . History of radiation therapy 11/03/13- 12/19/13    prostate bed 6600 cGy 33 sessions  . 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 Hypertension     Past Surgical History  Procedure Laterality Date  . Retropubic prostatectomy  03/02/2001    Gleason 7  . 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  Visit Diagnosis:  Muscle weakness (generalized)  Self-care deficit for dressing and grooming  Lack of coordination  Difficulty walking  Unsteady gait      Subjective Assessment - 03/13/15 1826    Subjective  Patient reports he feels he is doing good with his daily exercises but still feels his coordination is off in his RUE.   Patient Stated Goals Patient reports he wants to remain independent with his daily tasks, continue to work part time, wants his walking to be improved and get his golf game back again.   Currently in Pain? No/denies   Pain Score 0-No pain                      OT Treatments/Exercises (OP) - 03/13/15 1827    ADLs   ADL Comments Patient seen for functional component tasks of : managing small  buttons, motion of throwing a ball, sit to stand from a variety of surfaces, handwriting and hand to mouth patterns for managing utensils. All performed for 5 repetitions and cues as needed   Neurological Re-education Exercises   Other Exercises 1 Patient seen for instruction of LSVT BIG exercises: LSVT Daily Session Maximal Daily Exercises: Sustained movements are designed to rescale the amplitude of movement output for generalization to daily functional activities. Performed as follows for 1 set of 10 repetitions each: Multi directional sustained movements- 1) Floor to ceiling, 2) Side to side. Multi directional Repetitive movements performed in standing and are designed to provide retraining effort needed for sustained muscle activation in tasks Performed as follows: 3) Step and reach forward, 4) Step and Reach Backwards, 5) Step and reach sideways, 6) Rock and reach forward/backward, 7) Rock and reach sideways. Sit to stand from mat table on lowest setting with cues for weight shift, technique and CGA for 5 reps for 2 sets. Patient seen for functional mobility tasks this date with emphasis on gait speed, length of steps with short distance ambulation. Patient requires cues for BIG movements and cadence.All standing exercises performed this date with SBA, verbal and tactile cues as needed.   Other Exercises 2 Functional mobility tasks with gait for 1000 feet this  date with SBA and cues for amplitude of gait and moderate cues for reciprocal arm swing. Balance tasks this date standing on unstable surface and tossing ball with right UE at target and then into container., CGA and cues provided as needed Toe taps on stairs, up and down stairs with supervision and cues for technique, Balance tasks on balance pad this date with SBA.  Leg press for 2 sets of 20 reps , 180#.                OT Education - 03/13/15 1831    Education provided Yes   Education Details LSVT BIG exercises, ways to add  complexity to tasks.   Person(s) Educated Patient   Methods Explanation;Demonstration;Verbal cues   Comprehension Verbal cues required;Returned demonstration;Verbalized understanding             OT Long Term Goals - 03/01/15 2145    OT LONG TERM GOAL #1   Title Patient will improve gait speed and endurance and be able to walk 1750 feet in 6 minutes to negotiate around the home and community safely in 4 weeks.   Time 4   Period Weeks   Status New   OT LONG TERM GOAL #2   Title Patient will complete HEP for maximal daily exercises with modified independence in 4 weeks   Baseline no current program   Time 4   Period Weeks   Status New   OT LONG TERM GOAL #3   Title Patient will transfer from sit to stand without the use of arms safely and independently from a variety of chairs/surfaces in 4 weeks.    Baseline difficulty from low surfaces   Time 4   Status New   OT LONG TERM GOAL #4   Title Patient will be modified independent with home and work tasks including squatting to pick up items from the floor.   Time 4   Period Weeks   Status New   OT LONG TERM GOAL #5   Title Patient will demonstrate 90%legibility with handwriting for name and address with use of normal sized letters.   Time 4   Period Weeks   Status New               Plan - 03/13/15 1832    Clinical Impression Statement Patient improving with quality of movement, utilizing LSVT BIG techniques with decreased cues.  SBA to supervision for exercises with cues.  SBA to CGA for balance tasks in standing using unstable surfaces.  He reports having difficulty with small buttons at home on Sunday with collar button and cuff buttons however was able to perform shirt buttons with increased ease.  Will continue to focus on increasing complexity of tasks and challenging patients balance and coordination skills.     Pt will benefit from skilled therapeutic intervention in order to improve on the following deficits  (Retired) Impaired flexibility;Decreased coordination;Decreased mobility;Decreased endurance;Decreased range of motion;Decreased strength;Decreased balance;Difficulty walking;Impaired UE functional use   Rehab Potential Excellent   OT Frequency 4x / week   OT Duration 4 weeks   OT Treatment/Interventions Self-care/ADL training;Therapeutic exercise;Gait Training;Neuromuscular education;Stair Training;Functional Mobility Training;Patient/family education;Therapeutic activities;Balance training   Consulted and Agree with Plan of Care Patient        Problem List Patient Active Problem List   Diagnosis Date Noted  . Essential (primary) hypertension 01/29/2015  . Class 1 obesity 01/29/2015  . H/O malignant neoplasm of prostate 12/15/2014  . 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 Diabetes mellitus, type 2 ([place]) 02/07/2014  . Type 2 diabetes mellitus ([place]) 02/07/2014  . Bladder neck obstruction 01/02/2014  . Bladder retention 01/02/2014  . Abnormal prostate specific antigen 09/29/2011  . Elevated prostate specific antigen (PSA) 09/29/2011  . CA of prostate ([place]) 04/07/2011  . 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 Malignant neoplasm of prostate [facility]) 04/07/2011   Marian Grandt T Drina Jobst, OTR/L, CLT Mcadoo Muzquiz 03/14/2015, 6:37 PM  [place] MAIN [facility] SERVICES [address] [place], [place], [address] Phone: [phone]   Fax:  [phone]  Name: MAHDY LONSDALE MRN: [id number] Date of Birth: 15-Jun-1945

## 2015-03-15 ENCOUNTER — Encounter: Payer: Medicare Other | Admitting: Occupational Therapy

## 2015-03-15 ENCOUNTER — Encounter: Payer: Medicare Other | Admitting: Speech Pathology

## 2015-03-16 ENCOUNTER — Ambulatory Visit: Payer: Medicare Other | Admitting: Occupational Therapy

## 2015-03-16 ENCOUNTER — Ambulatory Visit: Payer: Medicare Other | Admitting: Speech Pathology

## 2015-03-16 ENCOUNTER — Encounter: Payer: Self-pay | Admitting: Speech Pathology

## 2015-03-16 DIAGNOSIS — M6281 Muscle weakness (generalized): Secondary | ICD-10-CM

## 2015-03-16 DIAGNOSIS — Z741 Need for assistance with personal care: Secondary | ICD-10-CM

## 2015-03-16 DIAGNOSIS — R490 Dysphonia: Secondary | ICD-10-CM | POA: Diagnosis not present

## 2015-03-16 DIAGNOSIS — R262 Difficulty in walking, not elsewhere classified: Secondary | ICD-10-CM

## 2015-03-16 DIAGNOSIS — R279 Unspecified lack of coordination: Secondary | ICD-10-CM

## 2015-03-16 DIAGNOSIS — R460 Very low level of personal hygiene: Secondary | ICD-10-CM

## 2015-03-16 DIAGNOSIS — R2681 Unsteadiness on feet: Secondary | ICD-10-CM

## 2015-03-16 NOTE — Therapy (Signed)
[place] MAIN [facility] SERVICES [address] [place], [place], [address] Phone: [phone]   Fax:  [phone]  Speech Language Pathology Treatment  Patient Details  Name: Chad Sandoval MRN: [id number] Date of Birth: 1945/06/12 Referring Provider: Dr. Manuella Ghazi  Encounter Date: 03/16/2015      End of Session - 03/16/15 1044    Visit Number 11   Number of Visits 17   Date for SLP Re-Evaluation 03/30/15   SLP Start Time 0945   SLP Stop Time  1038   SLP Time Calculation (min) 53 min   Activity Tolerance Patient tolerated treatment well      Past Medical History  Diagnosis Date  . Prostate cancer [facility]) 2003    s/p radical prostatectomy  . Thrombocytopenia ([place])   . History of radiation therapy 11/03/13- 12/19/13    prostate bed 6600 cGy 33 sessions  . 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 Hypertension     Past Surgical History  Procedure Laterality Date  . Retropubic prostatectomy  03/02/2001    Gleason 7  . 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  Visit Diagnosis: Dysphonia      Subjective Assessment - 03/16/15 1043    Subjective The patient reports that he is using his loud voice   Currently in Pain? No/denies               ADULT SLP TREATMENT - 03/16/15 0001    General Information   Behavior/Cognition Alert;Cooperative;Pleasant mood   HPI Parkinson's disease   Treatment Provided   Treatment provided Cognitive-Linquistic   Pain Assessment   Pain Assessment No/denies pain   Cognitive-Linquistic Treatment   Treatment focused on Voice   Skilled Treatment Daily Task #1: Average 18 seconds, 85 dB. Daily Task 2: Highs: 15 high pitched "ah" given no cues. Lows: 15 low pitched "ah" given min cues. Daily task #3: Average 75 dB.  Hierarchal speech loudness drill: Read paragraphs, 75 dB.  Generate longer responses, 75 dB.  Homework: assignments completed.  Off the  cuff remarks: average 71 dB.   Assessment / Recommendations / Plan   Plan Continue with current plan of care;Goals updated   Progression Toward Goals   Progression toward goals Progressing toward goals          SLP Education - 03/16/15 1044    Education provided Yes   Education Details LSVT-LOUD   Person(s) Educated Patient   Methods Explanation;Demonstration;Verbal cues;Handout   Comprehension Verbalized understanding;Returned demonstration;Verbal cues required;Need further instruction            SLP Long Term Goals - 03/14/15 1053    SLP LONG TERM GOAL #1   Title The patient will complete Daily Tasks (Maximum duration "ah", High/Lows, and Functional Phrases) at average loudness of 80 dB and with loud, good quality voice.    Time 4   Period Weeks   Status Partially Met   SLP LONG TERM GOAL #2   Title The patient will complete Hierarchal Speech Loudness reading drills (words/phrases, sentences, and paragraph) at average 75 dB and with loud, good quality voice.     Time 4   Period Weeks   Status Partially Met   SLP LONG TERM GOAL #3   Title The patient will complete homework daily.   Time 4   Period Weeks   Status On-going   SLP LONG TERM GOAL #4  Title The patient will participate in conversation, maintaining average loudness of 75 dB and loud, good quality voice.   Time 4   Period Weeks   Status Partially Met          Plan - 03/16/15 1045    Clinical Impression Statement The patient is completing daily tasks and hierarchal speech drill tasks with loud, good quality voice. The patient is exhibiting increased facial gestures. He is demonstrating increasing generalization into conversational speech.   Speech Therapy Frequency 4x / week   Duration 4 weeks   Treatment/Interventions Other (comment)  LSVT-LOUD   Potential to Achieve Goals Good   Potential Considerations Ability to learn/carryover information;Cooperation/participation level;Previous level of  function;Severity of impairments;Family/community support   SLP Home Exercise Plan LSVT-LOUD daily homework   Consulted and Agree with Plan of Care Patient        Problem List Patient Active Problem List   Diagnosis Date Noted  . Essential (primary) hypertension 01/29/2015  . Class 1 obesity 01/29/2015  . H/O malignant neoplasm of prostate 12/15/2014  . 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 Diabetes mellitus, type 2 ([place]) 02/07/2014  . Type 2 diabetes mellitus ([place]) 02/07/2014  . Bladder neck obstruction 01/02/2014  . Bladder retention 01/02/2014  . Abnormal prostate specific antigen 09/29/2011  . Elevated prostate specific antigen (PSA) 09/29/2011  . CA of prostate ([place]) 04/07/2011  . 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 Malignant neoplasm of prostate ([place]) 04/07/2011    Chad Sandoval 03/16/2015, 10:45 AM  [place] MAIN [facility] SERVICES [address] [place], [place], [address] Phone: [phone]   Fax:  [phone]   Name: Chad Sandoval MRN: [id number] Date of Birth: [birth year]

## 2015-03-16 NOTE — Therapy (Signed)
[place] MAIN [facility] SERVICES [address] [place], [place], [address] Phone: [phone]   Fax:  [phone]  Occupational Therapy Treatment  Patient Details  Name: Chad Sandoval MRN: [id number] Date of Birth: Dec 09, 1945 Referring Provider: Manuella Ghazi  Encounter Date: 03/16/2015      OT End of Session - 03/16/15 1537    Visit Number 11   Number of Visits 17   Date for OT Re-Evaluation 03/27/15   Authorization Type medicare G codes 11   OT Start Time 1114   OT Stop Time 1200   OT Time Calculation (min) 46 min   Activity Tolerance Patient tolerated treatment well   Behavior During Therapy [facility] for tasks assessed/performed      Past Medical History  Diagnosis Date  . Prostate cancer [facility]) 2003    s/p radical prostatectomy  . Thrombocytopenia ([place])   . History of radiation therapy 11/03/13- 12/19/13    prostate bed 6600 cGy 33 sessions  . 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 Hypertension     Past Surgical History  Procedure Laterality Date  . Retropubic prostatectomy  03/02/2001    Gleason 7  . 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  Visit Diagnosis:  Muscle weakness (generalized)  Self-care deficit for dressing and grooming  Lack of coordination  Difficulty walking  Unsteady gait      Subjective Assessment - 03/16/15 1536    Subjective  Patient reports he did some handwriting yesterday at work and did well, letters of bigger size and good legibility.    Patient Stated Goals Patient reports he wants to remain independent with his daily tasks, continue to work part time, wants his walking to be improved and get his golf game back again.   Currently in Pain? No/denies   Pain Score 0-No pain                      OT Treatments/Exercises (OP) - 03/16/15 1544    ADLs   ADL Comments Patient seen for functional component tasks of : managing small  buttons, motion of throwing a ball, sit to stand from a variety of surfaces, handwriting and hand to mouth patterns for managing utensils. All performed for 5 repetitions    Neurological Re-education Exercises   Other Exercises 1 Patient seen for instruction of LSVT BIG exercises: LSVT Daily Session Maximal Daily Exercises: Sustained movements are designed to rescale the amplitude of movement output for generalization to daily functional activities. Performed as follows for 1 set of 10 repetitions each: Multi directional sustained movements- 1) Floor to ceiling, 2) Side to side. Multi directional Repetitive movements performed in standing and are designed to provide retraining effort needed for sustained muscle activation in tasks Performed as follows: 3) Step and reach forward, 4) Step and Reach Backwards, 5) Step and reach sideways, 6) Rock and reach forward/backward, 7) Rock and reach sideways. Sit to stand from mat table on lowest setting with cues for weight shift, technique and CGA for 5 reps for 2 sets. Patient seen for functional mobility tasks this date with emphasis on gait speed, length of steps with short distance ambulation. Patient requires cues for BIG movements and cadence.All standing exercises performed this date with SBA, verbal and tactile cues as needed.   Other Exercises 2 Stair negotiation 5 reps with cues, reciprocal toe tapping on stairs for 10 reps,  leg press with 180# for 10 reps X 4, ball tosses in standing with aim for basket on the floor for 4 sets.  Bosu ball with flat surface upright standing on top inside parallel bars for balance and participating in ball toss and then balloon toss.                  OT Education - 03/16/15 1536    Education provided Yes   Education Details functional component tasks, HEP, coordination of RUE in daily tasks.   Person(s) Educated Patient   Methods Explanation;Demonstration;Verbal cues   Comprehension Verbalized  understanding;Returned demonstration;Verbal cues required             OT Long Term Goals - 03/15/15 1345    OT LONG TERM GOAL #1   Title Patient will improve gait speed and endurance and be able to walk 1750 feet in 6 minutes to negotiate around the home and community safely in 4 weeks.   Time 4   Period Weeks   Status On-going   OT LONG TERM GOAL #2   Title Patient will complete HEP for maximal daily exercises with modified independence in 4 weeks   Time 4   Period Weeks   Status Partially Met   OT LONG TERM GOAL #3   Title Patient will transfer from sit to stand without the use of arms safely and independently from a variety of chairs/surfaces in 4 weeks.    Time 4   Status On-going   OT LONG TERM GOAL #4   Title Patient will be modified independent with home and work tasks including squatting to pick up items from the floor.   Time 4   Period Weeks   Status Achieved   OT LONG TERM GOAL #5   Title Patient will demonstrate 90%legibility with handwriting for name and address with use of normal sized letters.   Time 4   Period Weeks   Status On-going               Plan - 03/16/15 1541    Clinical Impression Statement Patient continues to demonstrate improvements in all areas, still lacking some coordination skills in the right UE this date evident with attempts to perform body blade exercise, appeared frustrated slightly at times with this.  However, he did improve with the number of accurate ball tosses into the basket today compared to other days.  Will continue next week with adding complexity to tasks, distractions and grading activities as he progresses more to challenge him..   Pt will benefit from skilled therapeutic intervention in order to improve on the following deficits (Retired) Impaired flexibility;Decreased coordination;Decreased mobility;Decreased endurance;Decreased range of motion;Decreased strength;Decreased balance;Difficulty walking;Impaired UE  functional use   Rehab Potential Excellent   OT Frequency 4x / week   OT Duration 4 weeks   OT Treatment/Interventions Self-care/ADL training;Therapeutic exercise;Gait Training;Neuromuscular education;Stair Training;Functional Mobility Training;Patient/family education;Therapeutic activities;Balance training   Consulted and Agree with Plan of Care Patient        Problem List Patient Active Problem List   Diagnosis Date Noted  . Essential (primary) hypertension 01/29/2015  . Class 1 obesity 01/29/2015  . H/O malignant neoplasm of prostate 12/15/2014  . 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 Diabetes mellitus, type 2 ([place]) 02/07/2014  . Type 2 diabetes mellitus ([place]) 02/07/2014  . Bladder neck obstruction 01/02/2014  . Bladder retention 01/02/2014  . Abnormal prostate specific antigen 09/29/2011  . Elevated prostate specific antigen (PSA) 09/29/2011  .  CA of prostate ([place]) 04/07/2011  . 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 Malignant neoplasm of prostate [facility]) 04/07/2011   Shikira Folino T Selicia Windom, OTR/L, CLT Claudean Leavelle 03/16/2015, 3:48 PM  [place] MAIN [facility] SERVICES [address] [place], [place], [address] Phone: [phone]   Fax:  [phone]  Name: Chad Sandoval MRN: [id number] Date of Birth: 1945/09/11

## 2015-03-16 NOTE — Therapy (Signed)
[place] MAIN [facility] SERVICES [address] [place], [place], [address] Phone: [phone]   Fax:  [phone]  Occupational Therapy Treatment Occupational Therapy Progress Note 02/27/2015 to 03/14/2015   Patient Details  Name: Chad Sandoval MRN: [id number] Date of Birth: Apr 10, 1945 Referring Provider: Manuella Ghazi  Encounter Date: 03/14/2015      OT End of Session - 03/15/15 1337    Visit Number 10   Number of Visits 17   Date for OT Re-Evaluation 03/27/15   Authorization Type medicare G codes 10   OT Start Time 1100   OT Stop Time 1145   OT Time Calculation (min) 45 min   Activity Tolerance Patient tolerated treatment well   Behavior During Therapy [facility] for tasks assessed/performed      Past Medical History  Diagnosis Date  . Prostate cancer [facility]) 2003    s/p radical prostatectomy  . Thrombocytopenia ([place])   . History of radiation therapy 11/03/13- 12/19/13    prostate bed 6600 cGy 33 sessions  . 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 Hypertension     Past Surgical History  Procedure Laterality Date  . Retropubic prostatectomy  03/02/2001    Gleason 7  . 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  Visit Diagnosis:  Muscle weakness (generalized)  Self-care deficit for dressing and grooming  Lack of coordination  Difficulty walking  Unsteady gait      Subjective Assessment - 03/15/15 1332    Subjective  Patient reports he is feeling good today, no complaints, exercises going well at home.     Pertinent History Patient reports he was recently diagnosed with Parkinson's disease in December 2016.  He reports noticing decreased ability to play golf, his chipping and putting worsened in the last few months.  He recognized decreased coordination skills, right hand tremors and decreased legibility with handwriting.  He reports "scuffing his foot" at times.  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     Patient Stated Goals Patient reports he wants to remain independent with his daily tasks, continue to work part time, wants his walking to be improved and get his golf game back again.   Currently in Pain? No/denies   Pain Score 0-No pain                      OT Treatments/Exercises (OP) - 03/15/15 1333    ADLs   ADL Comments Patient seen for functional component tasks of : managing small buttons, motion of throwing a ball, sit to stand from a variety of surfaces, handwriting and hand to mouth patterns for managing utensils. All performed for 5 repetitions and cues as needed   Neurological Re-education Exercises   Other Exercises 1 Patient seen for instruction of LSVT BIG exercises: LSVT Daily Session Maximal Daily Exercises: Sustained movements are designed to rescale the amplitude of movement output for generalization to daily functional activities. Performed as follows for 1 set of 10 repetitions each: Multi directional sustained movements- 1) Floor to ceiling, 2) Side to side. Multi directional Repetitive movements performed in standing and are designed to provide retraining effort needed for sustained muscle activation in tasks Performed as follows: 3) Step and reach forward, 4) Step and Reach Backwards, 5) Step and reach sideways, 6) Rock and reach forward/backward, 7) [employer]  and reach sideways. Sit to stand from mat table on lowest setting with cues for weight shift, technique and CGA for 5 reps for 2 sets. Patient seen for functional mobility tasks this date with emphasis on gait speed, length of steps with short distance ambulation. Patient requires cues for BIG movements and cadence.All standing exercises performed this date with SBA, verbal and tactile cues as needed.   Other Exercises 2 Reassessment of 6 minute walk test this date 1705 feet.  Patient demos increased reciprocal arm swing and  increased amplitude of gait while performing.                OT Education - 03/15/15 1336    Education provided Yes   Education Details balance tasks, weight shifting , release of objects during throwing acts   Person(s) Educated Patient   Methods Explanation;Demonstration;Verbal cues   Comprehension Verbal cues required;Returned demonstration;Verbalized understanding             OT Long Term Goals - 03/15/15 1345    OT LONG TERM GOAL #1   Title Patient will improve gait speed and endurance and be able to walk 1750 feet in 6 minutes to negotiate around the home and community safely in 4 weeks.   Time 4   Period Weeks   Status On-going   OT LONG TERM GOAL #2   Title Patient will complete HEP for maximal daily exercises with modified independence in 4 weeks   Time 4   Period Weeks   Status Partially Met   OT LONG TERM GOAL #3   Title Patient will transfer from sit to stand without the use of arms safely and independently from a variety of chairs/surfaces in 4 weeks.    Time 4   Status On-going   OT LONG TERM GOAL #4   Title Patient will be modified independent with home and work tasks including squatting to pick up items from the floor.   Time 4   Period Weeks   Status Achieved   OT LONG TERM GOAL #5   Title Patient will demonstrate 90%legibility with handwriting for name and address with use of normal sized letters.   Time 4   Period Weeks   Status On-going               Plan - 03/15/15 1341    Clinical Impression Statement Patient has continued to progress and has shown significant gains with 6 minute walk test, now at 1705 feet, just shy of goal for 1750 feet.  He demonstrates improved reciprocal arm swing, increased amplitude of gait with testing and in functional context.  He has improved his size of letters when handwriting for increased legibility and has improved his performance with buttons. Have continued to challenge patient each session with  balance tasks and increasing complexity of acts performed in therapy.  He continues to benefit from skilled OT for intensive LSVT BIG program.    Pt will benefit from skilled therapeutic intervention in order to improve on the following deficits (Retired) Impaired flexibility;Decreased coordination;Decreased mobility;Decreased endurance;Decreased range of motion;Decreased strength;Decreased balance;Difficulty walking;Impaired UE functional use   Rehab Potential Excellent   OT Frequency 4x / week   OT Duration 4 weeks   OT Treatment/Interventions Self-care/ADL training;Therapeutic exercise;Gait Training;Neuromuscular education;Stair Training;Functional Mobility Training;Patient/family education;Therapeutic activities;Balance training   Consulted and Agree with Plan of Care Patient        Problem List Patient Active Problem List   Diagnosis Date Noted  . Essential (  primary) hypertension 01/29/2015  . Class 1 obesity 01/29/2015  . H/O malignant neoplasm of prostate 12/15/2014  . 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 Diabetes mellitus, type 2 ([place]) 02/07/2014  . Type 2 diabetes mellitus ([place]) 02/07/2014  . Bladder neck obstruction 01/02/2014  . Bladder retention 01/02/2014  . Abnormal prostate specific antigen 09/29/2011  . Elevated prostate specific antigen (PSA) 09/29/2011  . CA of prostate ([place]) 04/07/2011  . 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 Malignant neoplasm of prostate ([place]) 04/07/2011   Amy T Lovett, OTR/L, CLT Lovett,Amy 03/16/2015, 1:47 PM  [place] MAIN [facility] SERVICES [address] [place], [place], [address] Phone: [phone]   Fax:  [phone]  Name: Chad Sandoval MRN: [id number] Date of Birth: 12/13/1945

## 2015-03-19 ENCOUNTER — Ambulatory Visit: Payer: Medicare Other | Admitting: Occupational Therapy

## 2015-03-19 ENCOUNTER — Ambulatory Visit: Payer: Medicare Other | Admitting: Speech Pathology

## 2015-03-19 ENCOUNTER — Encounter: Payer: Medicare Other | Admitting: Occupational Therapy

## 2015-03-19 ENCOUNTER — Encounter: Payer: Medicare Other | Admitting: Speech Pathology

## 2015-03-19 ENCOUNTER — Encounter: Payer: Self-pay | Admitting: Speech Pathology

## 2015-03-19 DIAGNOSIS — R460 Very low level of personal hygiene: Secondary | ICD-10-CM

## 2015-03-19 DIAGNOSIS — M6281 Muscle weakness (generalized): Secondary | ICD-10-CM

## 2015-03-19 DIAGNOSIS — R279 Unspecified lack of coordination: Secondary | ICD-10-CM

## 2015-03-19 DIAGNOSIS — R490 Dysphonia: Secondary | ICD-10-CM

## 2015-03-19 DIAGNOSIS — R2681 Unsteadiness on feet: Secondary | ICD-10-CM

## 2015-03-19 DIAGNOSIS — R262 Difficulty in walking, not elsewhere classified: Secondary | ICD-10-CM

## 2015-03-19 DIAGNOSIS — Z741 Need for assistance with personal care: Secondary | ICD-10-CM

## 2015-03-19 NOTE — Therapy (Signed)
[place] MAIN [facility] SERVICES [address] [place], [place], [address] Phone: [phone]   Fax:  [phone]  Speech Language Pathology Treatment  Patient Details  Name: Chad Sandoval MRN: [id number] Date of Birth: 06/12/1945 Referring Provider: Dr. Manuella Ghazi  Encounter Date: 03/19/2015      End of Session - 03/19/15 1118    Visit Number 12   Number of Visits 17   Date for SLP Re-Evaluation 03/30/15   SLP Start Time 1000   SLP Stop Time  1054   SLP Time Calculation (min) 54 min   Activity Tolerance Patient tolerated treatment well      Past Medical History  Diagnosis Date  . Prostate cancer [facility]) 2003    s/p radical prostatectomy  . Thrombocytopenia ([place])   . History of radiation therapy 11/03/13- 12/19/13    prostate bed 6600 cGy 33 sessions  . 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 Hypertension     Past Surgical History  Procedure Laterality Date  . Retropubic prostatectomy  03/02/2001    Gleason 7  . 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  Visit Diagnosis: Dysphonia      Subjective Assessment - 03/19/15 1117    Subjective The patient reports that he is using his loud voice   Currently in Pain? No/denies               ADULT SLP TREATMENT - 03/19/15 0001    General Information   Behavior/Cognition Alert;Cooperative;Pleasant mood   HPI Parkinson's disease   Treatment Provided   Treatment provided Cognitive-Linquistic   Pain Assessment   Pain Assessment No/denies pain   Cognitive-Linquistic Treatment   Treatment focused on Voice   Skilled Treatment Daily Task #1: Average 18 seconds, 85 dB. Daily Task 2: Highs: 15 high pitched "ah" given no cues. Lows: 15 low pitched "ah" given min cues. Daily task #3: Average 75 dB.  Hierarchal speech loudness drill: Read paragraphs, 75 dB.  Generate longer responses, 75 dB.  Homework: assignments completed.  Off the  cuff remarks: average 71 dB.   Assessment / Recommendations / Plan   Plan Continue with current plan of care;Goals updated   Progression Toward Goals   Progression toward goals Progressing toward goals          SLP Education - 03/19/15 1118    Education provided Yes   Education Details LSVT-LOUD   Person(s) Educated Patient   Methods Explanation;Demonstration;Verbal cues;Handout   Comprehension Verbalized understanding;Returned demonstration;Need further instruction            SLP Long Term Goals - 03/14/15 1053    SLP LONG TERM GOAL #1   Title The patient will complete Daily Tasks (Maximum duration "ah", High/Lows, and Functional Phrases) at average loudness of 80 dB and with loud, good quality voice.    Time 4   Period Weeks   Status Partially Met   SLP LONG TERM GOAL #2   Title The patient will complete Hierarchal Speech Loudness reading drills (words/phrases, sentences, and paragraph) at average 75 dB and with loud, good quality voice.     Time 4   Period Weeks   Status Partially Met   SLP LONG TERM GOAL #3   Title The patient will complete homework daily.   Time 4   Period Weeks   Status On-going   SLP LONG TERM GOAL #4   Title  The patient will participate in conversation, maintaining average loudness of 75 dB and loud, good quality voice.   Time 4   Period Weeks   Status Partially Met          Plan - 03/19/15 1118    Clinical Impression Statement The patient is completing daily tasks and hierarchal speech drill tasks with loud, good quality voice. The patient is exhibiting increased facial gestures. He is demonstrating generalization into conversational speech.   Speech Therapy Frequency 4x / week   Duration 4 weeks   Treatment/Interventions Other (comment)  LSVT-LOUD   Potential to Achieve Goals Good   Potential Considerations Ability to learn/carryover information;Cooperation/participation level;Previous level of function;Severity of  impairments;Family/community support   SLP Home Exercise Plan LSVT-LOUD daily homework   Consulted and Agree with Plan of Care Patient        Problem List Patient Active Problem List   Diagnosis Date Noted  . Essential (primary) hypertension 01/29/2015  . Class 1 obesity 01/29/2015  . H/O malignant neoplasm of prostate 12/15/2014  . 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 Diabetes mellitus, type 2 ([place]) 02/07/2014  . Type 2 diabetes mellitus ([place]) 02/07/2014  . Bladder neck obstruction 01/02/2014  . Bladder retention 01/02/2014  . Abnormal prostate specific antigen 09/29/2011  . Elevated prostate specific antigen (PSA) 09/29/2011  . CA of prostate ([place]) 04/07/2011  . 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 Malignant neoplasm of prostate ([place]) 04/07/2011   Chad Sea, MS/CCC- SLP  Chad Sandoval 03/19/2015, 11:19 AM  [place] MAIN [facility] SERVICES [address] [place], [place], [address] Phone: [phone]   Fax:  [phone]   Name: Chad Sandoval MRN: [id number] Date of Birth: 04/21/1945

## 2015-03-20 ENCOUNTER — Encounter: Payer: Medicare Other | Admitting: Occupational Therapy

## 2015-03-20 ENCOUNTER — Encounter: Payer: Medicare Other | Admitting: Speech Pathology

## 2015-03-20 ENCOUNTER — Encounter: Payer: Self-pay | Admitting: Speech Pathology

## 2015-03-20 ENCOUNTER — Ambulatory Visit: Payer: Medicare Other | Admitting: Speech Pathology

## 2015-03-20 ENCOUNTER — Ambulatory Visit: Payer: Medicare Other | Admitting: Occupational Therapy

## 2015-03-20 DIAGNOSIS — R460 Very low level of personal hygiene: Secondary | ICD-10-CM

## 2015-03-20 DIAGNOSIS — M6281 Muscle weakness (generalized): Secondary | ICD-10-CM

## 2015-03-20 DIAGNOSIS — Z741 Need for assistance with personal care: Secondary | ICD-10-CM

## 2015-03-20 DIAGNOSIS — R490 Dysphonia: Secondary | ICD-10-CM

## 2015-03-20 DIAGNOSIS — R2681 Unsteadiness on feet: Secondary | ICD-10-CM

## 2015-03-20 DIAGNOSIS — R262 Difficulty in walking, not elsewhere classified: Secondary | ICD-10-CM

## 2015-03-20 DIAGNOSIS — R279 Unspecified lack of coordination: Secondary | ICD-10-CM

## 2015-03-20 NOTE — Therapy (Signed)
[place] MAIN [facility] SERVICES [address] [place], [place], [address] Phone: [phone]   Fax:  [phone]  Occupational Therapy Treatment  Patient Details  Name: Chad Sandoval MRN: [id number] Date of Birth: Jul 01, 1945 Referring Provider: Manuella Ghazi  Encounter Date: 03/20/2015      OT End of Session - 03/20/15 1633    Visit Number 13   Number of Visits 17   Date for OT Re-Evaluation 03/27/15   Authorization Type medicare G codes 13   OT Start Time 05-16-04   OT Stop Time 1100   OT Time Calculation (min) 54 min   Activity Tolerance Patient tolerated treatment well   Behavior During Therapy [facility] for tasks assessed/performed      Past Medical History  Diagnosis Date  . Prostate cancer [facility]) 05/16/01    s/p radical prostatectomy  . Thrombocytopenia ([place])   . History of radiation therapy 11/03/13- 12/19/13    prostate bed 6600 cGy 33 sessions  . 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 Hypertension     Past Surgical History  Procedure Laterality Date  . Retropubic prostatectomy  03/02/2001    Gleason 7  . Splenectomy  May 16, 2012    [facility]    There were no vitals filed for this visit.  Visit Diagnosis:  Muscle weakness (generalized)  Self-care deficit for dressing and grooming  Difficulty walking  Lack of coordination  Unsteady gait      Subjective Assessment - 03/20/15 1628    Subjective  Patient reports it is harder to add the hand flicks to exercises, "I can tell its working my arms more".  Patient reports improved feeding with right UE, improved size of handwriting and legibility.   Pertinent History Patient reports he was recently diagnosed with Parkinson's disease in December 2016.  He reports noticing decreased ability to play golf, his chipping and putting worsened in the last few months.  He recognized decreased coordination skills, right hand tremors and decreased legibility with handwriting.  He reports  "scuffing his foot" at times.  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     Patient Stated Goals Patient reports he wants to remain independent with his daily tasks, continue to work part time, wants his walking to be improved and get his golf game back again.   Currently in Pain? No/denies   Pain Score 0-No pain   Multiple Pain Sites No                      OT Treatments/Exercises (OP) - 03/20/15 1629    ADLs   ADL Comments Patient seen for functional component tasks of : managing small buttons, motion of throwing a ball, sit to stand from a variety of surfaces, handwriting and hand to mouth patterns for managing utensils. All performed for 5 repetitions    Neurological Re-education Exercises   Other Exercises 1 Patient seen for instruction of LSVT BIG exercises: LSVT Daily Session Maximal Daily Exercises: Sustained movements are designed to rescale the amplitude of movement output for generalization to daily functional activities. Performed as follows for 1 set of 10 repetitions each: Multi directional sustained movements- 1) Floor to ceiling, 2) Side to side. Multi directional Repetitive movements performed in standing and are designed to provide retraining effort needed for sustained muscle activation in tasks Performed as follows: 3) Step and reach forward, 4) Step and Reach  Backwards, 5) Step and reach sideways, 6) Rock and reach forward/backward, 7) Rock and reach sideways. Sit to stand from mat table on lowest setting with cues for weight shift, technique, supervision for 5 reps for 2 sets. Patient seen for functional mobility tasks this date with emphasis on gait speed, length of steps with short distance ambulation. Patient requires cues for BIG movements and cadence.All standing exercises performed this date with supervision, verbal and tactile cues as needed.   Other Exercises 2 Stair negotiation 5 reps with cues,  reciprocal toe tapping on stairs for 10 reps, leg press with 180# for 20 reps for 2 sets. Functional mobility for 1250 feet with cues for utilizing BIG principles and reciprocal arm swings. Balance tasks in standing with tennis ball this date for bounce/catch, toss/catch, combo of 2 together and then bounce/catch while performing functional mobility with cues. Obstacle course while engaging in coordination task.                OT Education - 03/20/15 1632    Education provided Yes   Education Details HEP, hand and arm flicks   Person(s) Educated Patient   Methods Explanation;Demonstration;Verbal cues   Comprehension Verbal cues required;Returned demonstration;Verbalized understanding             OT Long Term Goals - 03/15/15 1345    OT LONG TERM GOAL #1   Title Patient will improve gait speed and endurance and be able to walk 1750 feet in 6 minutes to negotiate around the home and community safely in 4 weeks.   Time 4   Period Weeks   Status On-going   OT LONG TERM GOAL #2   Title Patient will complete HEP for maximal daily exercises with modified independence in 4 weeks   Time 4   Period Weeks   Status Partially Met   OT LONG TERM GOAL #3   Title Patient will transfer from sit to stand without the use of arms safely and independently from a variety of chairs/surfaces in 4 weeks.    Time 4   Status On-going   OT LONG TERM GOAL #4   Title Patient will be modified independent with home and work tasks including squatting to pick up items from the floor.   Time 4   Period Weeks   Status Achieved   OT LONG TERM GOAL #5   Title Patient will demonstrate 90%legibility with handwriting for name and address with use of normal sized letters.   Time 4   Period Weeks   Status On-going               Plan - 03/20/15 1633    Clinical Impression Statement Patient incorporating hand flicks into daily exercises with success.  Added tennis ball drills for coordination task  while engaging in functional mobility and balance skills this date.  He has continued to improve his functional mobility distance with use of BIG principles and increased amplitude of movement.   Pt will benefit from skilled therapeutic intervention in order to improve on the following deficits (Retired) Impaired flexibility;Decreased coordination;Decreased mobility;Decreased endurance;Decreased range of motion;Decreased strength;Decreased balance;Difficulty walking;Impaired UE functional use   Rehab Potential Excellent   OT Frequency 4x / week   OT Duration 4 weeks   OT Treatment/Interventions Self-care/ADL training;Therapeutic exercise;Gait Training;Neuromuscular education;Stair Training;Functional Mobility Training;Patient/family education;Therapeutic activities;Balance training   Consulted and Agree with Plan of Care Patient        Problem List Patient Active Problem List   Diagnosis  Date Noted  . Essential (primary) hypertension 01/29/2015  . Class 1 obesity 01/29/2015  . H/O malignant neoplasm of prostate 12/15/2014  . 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 Diabetes mellitus, type 2 ([place]) 02/07/2014  . Type 2 diabetes mellitus ([place]) 02/07/2014  . Bladder neck obstruction 01/02/2014  . Bladder retention 01/02/2014  . Abnormal prostate specific antigen 09/29/2011  . Elevated prostate specific antigen (PSA) 09/29/2011  . CA of prostate ([place]) 04/07/2011  . 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 Malignant neoplasm of prostate [facility]) 04/07/2011   Akira Adelsberger T Vuong Musa, OTR/L, CLT Satara Virella 03/20/2015, 4:35 PM  [place] MAIN [facility] SERVICES [address] [place], [place], [address] Phone: [phone]   Fax:  [phone]  Name: TEVIN SHILLINGFORD MRN: [id number] Date of Birth: April 01, 1945

## 2015-03-20 NOTE — Therapy (Signed)
[place] MAIN [facility] SERVICES [address] [place], [place], [address] Phone: [phone]   Fax:  [phone]  Speech Language Pathology Treatment  Patient Details  Name: Chad Sandoval MRN: [id number] Date of Birth: 06-12-1945 Referring Provider: Dr. Manuella Ghazi  Encounter Date: 03/20/2015      End of Session - 03/20/15 1201    Visit Number 13   Number of Visits 17   Date for SLP Re-Evaluation 03/30/15   SLP Start Time 18   SLP Stop Time  1150   SLP Time Calculation (min) 50 min   Activity Tolerance Patient tolerated treatment well      Past Medical History  Diagnosis Date  . Prostate cancer [facility]) 2003    s/p radical prostatectomy  . Thrombocytopenia ([place])   . History of radiation therapy 11/03/13- 12/19/13    prostate bed 6600 cGy 33 sessions  . 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 Hypertension     Past Surgical History  Procedure Laterality Date  . Retropubic prostatectomy  03/02/2001    Gleason 7  . 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  Visit Diagnosis: Dysphonia      Subjective Assessment - 03/20/15 1201    Subjective The patient reports that he is using his loud voice   Currently in Pain? No/denies               ADULT SLP TREATMENT - 03/20/15 0001    General Information   Behavior/Cognition Alert;Cooperative;Pleasant mood   HPI Parkinson's disease   Treatment Provided   Treatment provided Cognitive-Linquistic   Pain Assessment   Pain Assessment No/denies pain   Cognitive-Linquistic Treatment   Treatment focused on Voice   Skilled Treatment Daily Task #1: Average 18 seconds, 85 dB. Daily Task 2: Highs: 15 high pitched "ah" given no cues. Lows: 15 low pitched "ah" given min cues. Daily task #3: Average 75 dB.  Hierarchal speech loudness drill: Lengthy read, 75 dB.  Structured conversation, 75 dB.  Homework: assignments completed.  Off the cuff  remarks: average 73 dB.   Assessment / Recommendations / Plan   Plan Continue with current plan of care;Goals updated   Progression Toward Goals   Progression toward goals Progressing toward goals          SLP Education - 03/20/15 1201    Education provided Yes   Education Details LSVT-LOUD   Person(s) Educated Patient   Methods Explanation;Demonstration;Verbal cues;Handout   Comprehension Verbalized understanding;Returned demonstration            SLP Long Term Goals - 03/14/15 1053    SLP LONG TERM GOAL #1   Title The patient will complete Daily Tasks (Maximum duration "ah", High/Lows, and Functional Phrases) at average loudness of 80 dB and with loud, good quality voice.    Time 4   Period Weeks   Status Partially Met   SLP LONG TERM GOAL #2   Title The patient will complete Hierarchal Speech Loudness reading drills (words/phrases, sentences, and paragraph) at average 75 dB and with loud, good quality voice.     Time 4   Period Weeks   Status Partially Met   SLP LONG TERM GOAL #3   Title The patient will complete homework daily.   Time 4   Period Weeks   Status On-going   SLP LONG TERM GOAL #4   Title The patient will  participate in conversation, maintaining average loudness of 75 dB and loud, good quality voice.   Time 4   Period Weeks   Status Partially Met          Plan - 03/20/15 1202    Clinical Impression Statement The patient is completing daily tasks and hierarchal speech drill tasks with loud, good quality voice. The patient is exhibiting increased facial gestures. He is demonstrating generalization into conversational speech.   Speech Therapy Frequency 4x / week   Duration 4 weeks   Treatment/Interventions Other (comment)  LSVT-LOUD   Potential to Achieve Goals Good   Potential Considerations Ability to learn/carryover information;Cooperation/participation level;Previous level of function;Severity of impairments;Family/community support   SLP Home  Exercise Plan LSVT-LOUD daily homework   Consulted and Agree with Plan of Care Patient        Problem List Patient Active Problem List   Diagnosis Date Noted  . Essential (primary) hypertension 01/29/2015  . Class 1 obesity 01/29/2015  . H/O malignant neoplasm of prostate 12/15/2014  . 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 Diabetes mellitus, type 2 ([place]) 02/07/2014  . Type 2 diabetes mellitus ([place]) 02/07/2014  . Bladder neck obstruction 01/02/2014  . Bladder retention 01/02/2014  . Abnormal prostate specific antigen 09/29/2011  . Elevated prostate specific antigen (PSA) 09/29/2011  . CA of prostate ([place]) 04/07/2011  . 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 Malignant neoplasm of prostate ([place]) 04/07/2011   [name], MS/CCC- SLP  Lou Miner 03/20/2015, 12:03 PM  [place] MAIN [facility] SERVICES [address] [place], [place], [address] Phone: [phone]   Fax:  [phone]   Name: TYSHAWN CIULLO MRN: [id number] Date of Birth: [birth year]

## 2015-03-20 NOTE — Therapy (Signed)
[place] [facility] MAIN [facility] SERVICES [address] [place], [place], [address] Phone: [phone]   Fax:  [phone]  Occupational Therapy Treatment  Patient Details  Name: Chad Sandoval MRN: [id number] Date of Birth: October 24, 1945 Referring Provider: Sherryll Burger  Encounter Date: 03/19/2015      OT End of Session - 03/19/15 1621    Visit Number 12   Number of Visits 17   Date for OT Re-Evaluation 03/27/15   Authorization Type medicare G codes 12   OT Start Time 1055   OT Stop Time 1154   OT Time Calculation (min) 59 min   Activity Tolerance Patient tolerated treatment well   Behavior During Therapy [facility] for tasks assessed/performed      Past Medical History  Diagnosis Date  . Prostate cancer [facility]) 2003    s/p radical prostatectomy  . Thrombocytopenia ([facility])   . History of radiation therapy 11/03/13- 12/19/13    prostate bed 6600 cGy 33 sessions  . 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[facility]) 07/28/2014  . Hypertension     Past Surgical History  Procedure Laterality Date  . Retropubic prostatectomy  03/02/2001    Gleason 7  . 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  Visit Diagnosis:  Muscle weakness (generalized)  Self-care deficit for dressing and grooming  Difficulty walking  Lack of coordination  Unsteady gait      Subjective Assessment - 03/19/15 1617    Subjective  Patient reports he has continued to perform exercises at home and has done well.  Feels his throwing motion has gotten better, more smooth and controlled.    Pertinent History Patient reports he was recently diagnosed with Parkinson's disease in December 2016.  He reports noticing decreased ability to play golf, his chipping and putting worsened in the last few months.  He recognized decreased coordination skills, right hand tremors and decreased legibility with handwriting.  He reports "scuffing his foot" at times.  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     Patient Stated Goals Patient reports he wants to remain independent with his daily tasks, continue to work part time, wants his walking to be improved and get his golf game back again.   Currently in Pain? No/denies   Pain Score 0-No pain                      OT Treatments/Exercises (OP) - 03/19/15 1618    ADLs   ADL Comments Patient seen for functional component tasks of : managing small buttons, motion of throwing a ball, sit to stand from a variety of surfaces, handwriting and hand to mouth patterns for managing utensils. All performed for 5 repetitions    Neurological Re-education Exercises   Other Exercises 1 Patient seen for instruction of LSVT BIG exercises: LSVT Daily Session Maximal Daily Exercises: Sustained movements are designed to rescale the amplitude of movement output for generalization to daily functional activities. Performed as follows for 1 set of 10 repetitions each: Multi directional sustained movements- 1) Floor to ceiling, 2) Side to side. Multi directional Repetitive movements performed in standing and are designed to provide retraining effort needed for sustained muscle activation in tasks Performed as follows: 3) Step and reach forward, 4) Step and Reach Backwards, 5) Step and reach sideways, 6) Rock and reach forward/backward, 7) [employer]  and reach sideways. Sit to stand from mat table on lowest setting with cues for weight shift, technique and CGA for 5 reps for 2 sets. Patient seen for functional mobility tasks this date with emphasis on gait speed, length of steps with short distance ambulation. Patient requires cues for BIG movements and cadence.All standing exercises performed this date with supervision, verbal and tactile cues as needed.   Other Exercises 2 Stair negotiation 5 reps with cues, reciprocal toe tapping on stairs for 10 reps, leg press with 180# for 20 reps for  2 sets.  Functional mobility for 1200 feet with cues for utilizing BIG principles and reciprocal arm swings. Balance tasks in standing with ball toss, cues for timing of release of object and for target.                      OT Long Term Goals - 03/15/15 1345    OT LONG TERM GOAL #1   Title Patient will improve gait speed and endurance and be able to walk 1750 feet in 6 minutes to negotiate around the home and community safely in 4 weeks.   Time 4   Period Weeks   Status On-going   OT LONG TERM GOAL #2   Title Patient will complete HEP for maximal daily exercises with modified independence in 4 weeks   Time 4   Period Weeks   Status Partially Met   OT LONG TERM GOAL #3   Title Patient will transfer from sit to stand without the use of arms safely and independently from a variety of chairs/surfaces in 4 weeks.    Time 4   Status On-going   OT LONG TERM GOAL #4   Title Patient will be modified independent with home and work tasks including squatting to pick up items from the floor.   Time 4   Period Weeks   Status Achieved   OT LONG TERM GOAL #5   Title Patient will demonstrate 90%legibility with handwriting for name and address with use of normal sized letters.   Time 4   Period Weeks   Status On-going               Plan - 03/19/15 1622    Clinical Impression Statement Patient has made excellent progress and will be finishing up at the end of this week with the intensive portion of his LSVT BIG program specifically designed for Parkinson's disease.  Instructed patient on addition of arm and hand flicks during exercises to increase complexity of tasks, he is able t demonstrate.  Improved consistency with ball throwing this date, with all time record of 12 items of 16 into the basket successfully.  Continue to work towards goals with focus on increasing amplitude of movements.   Pt will benefit from skilled therapeutic intervention in order to improve on the  following deficits (Retired) Impaired flexibility;Decreased coordination;Decreased mobility;Decreased endurance;Decreased range of motion;Decreased strength;Decreased balance;Difficulty walking;Impaired UE functional use   Rehab Potential Excellent   OT Frequency 4x / week   OT Duration 4 weeks   OT Treatment/Interventions Self-care/ADL training;Therapeutic exercise;Gait Training;Neuromuscular education;Stair Training;Functional Mobility Training;Patient/family education;Therapeutic activities;Balance training   Consulted and Agree with Plan of Care Patient        Problem List Patient Active Problem List   Diagnosis Date Noted  . Essential (primary) hypertension 01/29/2015  . Class 1 obesity 01/29/2015  . H/O malignant neoplasm of prostate 12/15/2014  . 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  Diabetes mellitus, type 2 ([place]) 02/07/2014  . Type 2 diabetes mellitus ([place]) 02/07/2014  . Bladder neck obstruction 01/02/2014  . Bladder retention 01/02/2014  . Abnormal prostate specific antigen 09/29/2011  . Elevated prostate specific antigen (PSA) 09/29/2011  . CA of prostate ([place]) 04/07/2011  . 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 Malignant neoplasm of prostate [facility]) 04/07/2011   Brenley Priore T Krisha Beegle, OTR/L, CLT Meilah Delrosario 03/20/2015, 4:26 PM  [place] MAIN [facility] SERVICES [address] [place], [place], [address] Phone: [phone]   Fax:  [phone]  Name: Chad Sandoval MRN: [id number] Date of Birth: [birth year]

## 2015-03-21 ENCOUNTER — Encounter: Payer: Self-pay | Admitting: Speech Pathology

## 2015-03-21 ENCOUNTER — Ambulatory Visit: Payer: Medicare Other | Admitting: Occupational Therapy

## 2015-03-21 ENCOUNTER — Encounter: Payer: Medicare Other | Admitting: Speech Pathology

## 2015-03-21 ENCOUNTER — Ambulatory Visit: Payer: Medicare Other | Admitting: Speech Pathology

## 2015-03-21 ENCOUNTER — Encounter: Payer: Medicare Other | Admitting: Occupational Therapy

## 2015-03-21 DIAGNOSIS — R279 Unspecified lack of coordination: Secondary | ICD-10-CM

## 2015-03-21 DIAGNOSIS — R460 Very low level of personal hygiene: Secondary | ICD-10-CM

## 2015-03-21 DIAGNOSIS — R490 Dysphonia: Secondary | ICD-10-CM | POA: Diagnosis not present

## 2015-03-21 DIAGNOSIS — Z741 Need for assistance with personal care: Secondary | ICD-10-CM

## 2015-03-21 DIAGNOSIS — M6281 Muscle weakness (generalized): Secondary | ICD-10-CM

## 2015-03-21 DIAGNOSIS — R262 Difficulty in walking, not elsewhere classified: Secondary | ICD-10-CM

## 2015-03-21 DIAGNOSIS — R2681 Unsteadiness on feet: Secondary | ICD-10-CM

## 2015-03-21 NOTE — Therapy (Signed)
[place] [facility] MAIN [facility] SERVICES [address] [place], [place], [address] Phone: [phone]   Fax:  [phone]  Speech Language Pathology Treatment  Patient Details  Name: Chad Sandoval MRN: [id number] Date of Birth: [birth year] Referring Provider: Dr. Sherryll Burger  Encounter Date: 03/21/2015      End of Session - 03/21/15 1336    Visit Number 14   Number of Visits 17   Date for SLP Re-Evaluation 03/30/15   SLP Start Time 1000   SLP Stop Time  1056   SLP Time Calculation (min) 56 min   Activity Tolerance Patient tolerated treatment well      Past Medical History  Diagnosis Date  . Prostate cancer [facility]) 2003    s/p radical prostatectomy  . Thrombocytopenia ([facility])   . History of radiation therapy 11/03/13- 12/19/13    prostate bed 6600 cGy 33 sessions  . 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[facility]) 07/28/2014  . Hypertension     Past Surgical History  Procedure Laterality Date  . Retropubic prostatectomy  03/02/2001    Gleason 7  . 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  Visit Diagnosis: Dysphonia      Subjective Assessment - 03/21/15 1335    Subjective The patient reports that he is using his loud voice   Currently in Pain? No/denies               ADULT SLP TREATMENT - 03/21/15 0001    General Information   Behavior/Cognition Alert;Cooperative;Pleasant mood   HPI Parkinson's disease   Treatment Provided   Treatment provided Cognitive-Linquistic   Pain Assessment   Pain Assessment No/denies pain   Cognitive-Linquistic Treatment   Treatment focused on Voice   Skilled Treatment Daily Task #1: Average 19 seconds, 85 dB. Daily Task 2: Highs: 15 high pitched "ah" given no cues. Lows: 15 low pitched "ah" given no cues. Daily task #3: Average 75 dB.  Hierarchal speech loudness drill: lengthy reading, 75 dB.  Structured conversation, 75 dB.  Homework: assignments completed.  Off the  cuff remarks: average 74 dB.   Assessment / Recommendations / Plan   Plan Continue with current plan of care;Goals updated   Progression Toward Goals   Progression toward goals Progressing toward goals          SLP Education - 03/21/15 1336    Education provided Yes   Education Details LSVT-LOUD   Person(s) Educated Patient   Methods Explanation;Demonstration;Verbal cues;Handout   Comprehension Verbalized understanding;Returned demonstration            SLP Long Term Goals - 03/14/15 1053    SLP LONG TERM GOAL #1   Title The patient will complete Daily Tasks (Maximum duration "ah", High/Lows, and Functional Phrases) at average loudness of 80 dB and with loud, good quality voice.    Time 4   Period Weeks   Status Partially Met   SLP LONG TERM GOAL #2   Title The patient will complete Hierarchal Speech Loudness reading drills (words/phrases, sentences, and paragraph) at average 75 dB and with loud, good quality voice.     Time 4   Period Weeks   Status Partially Met   SLP LONG TERM GOAL #3   Title The patient will complete homework daily.   Time 4   Period Weeks   Status On-going   SLP LONG TERM GOAL #4   Title The patient will  participate in conversation, maintaining average loudness of 75 dB and loud, good quality voice.   Time 4   Period Weeks   Status Partially Met          Plan - 03/21/15 1336    Clinical Impression Statement The patient is completing daily tasks and hierarchal speech drill tasks with loud, good quality voice. The patient is exhibiting increased facial gestures. He is demonstrating generalization into conversational speech.   Speech Therapy Frequency 4x / week   Duration 4 weeks   Treatment/Interventions Other (comment)  LSVT-LOUD   Potential to Achieve Goals Good   Potential Considerations Ability to learn/carryover information;Cooperation/participation level;Previous level of function;Severity of impairments;Family/community support   SLP  Home Exercise Plan LSVT-LOUD daily homework   Consulted and Agree with Plan of Care Patient        Problem List Patient Active Problem List   Diagnosis Date Noted  . Essential (primary) hypertension 01/29/2015  . Class 1 obesity 01/29/2015  . H/O malignant neoplasm of prostate 12/15/2014  . 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 Diabetes mellitus, type 2 ([place]) 02/07/2014  . Type 2 diabetes mellitus ([place]) 02/07/2014  . Bladder neck obstruction 01/02/2014  . Bladder retention 01/02/2014  . Abnormal prostate specific antigen 09/29/2011  . Elevated prostate specific antigen (PSA) 09/29/2011  . CA of prostate ([place]) 04/07/2011  . 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 Malignant neoplasm of prostate ([place]) 04/07/2011   [name], MS/CCC- SLP  Lou Miner 03/21/2015, 1:37 PM  [place] MAIN [facility] SERVICES [address] [place], [place], [address] Phone: [phone]   Fax:  [phone]   Name: DANTONIO JUSTEN MRN: [id number] Date of Birth: [birth year]

## 2015-03-22 ENCOUNTER — Ambulatory Visit: Payer: Medicare Other | Admitting: Occupational Therapy

## 2015-03-22 ENCOUNTER — Encounter: Payer: Medicare Other | Admitting: Speech Pathology

## 2015-03-22 ENCOUNTER — Ambulatory Visit: Payer: Medicare Other | Admitting: Speech Pathology

## 2015-03-22 ENCOUNTER — Encounter: Payer: Medicare Other | Admitting: Occupational Therapy

## 2015-03-22 ENCOUNTER — Encounter: Payer: Self-pay | Admitting: Speech Pathology

## 2015-03-22 DIAGNOSIS — M6281 Muscle weakness (generalized): Secondary | ICD-10-CM

## 2015-03-22 DIAGNOSIS — R460 Very low level of personal hygiene: Secondary | ICD-10-CM

## 2015-03-22 DIAGNOSIS — Z741 Need for assistance with personal care: Secondary | ICD-10-CM

## 2015-03-22 DIAGNOSIS — R262 Difficulty in walking, not elsewhere classified: Secondary | ICD-10-CM

## 2015-03-22 DIAGNOSIS — R2681 Unsteadiness on feet: Secondary | ICD-10-CM

## 2015-03-22 DIAGNOSIS — R279 Unspecified lack of coordination: Secondary | ICD-10-CM

## 2015-03-22 DIAGNOSIS — R490 Dysphonia: Secondary | ICD-10-CM

## 2015-03-22 NOTE — Therapy (Signed)
[place] MAIN [facility] SERVICES [address] [place], [place], [address] Phone: [phone]   Fax:  [phone]  Speech Language Pathology Treatment/Discharge Summary  Patient Details  Name: Chad Sandoval MRN: [id number] Date of Birth: 01-27-1946 Referring Provider: Dr. Manuella Ghazi  Encounter Date: 03/22/2015      End of Session - 03/22/15 1048    Visit Number 15   Number of Visits 17   Date for SLP Re-Evaluation 03/30/15   SLP Start Time 1000   SLP Stop Time  1046   SLP Time Calculation (min) 46 min   Activity Tolerance Patient tolerated treatment well      Past Medical History  Diagnosis Date  . Prostate cancer [facility]) 2003    s/p radical prostatectomy  . Thrombocytopenia ([place])   . History of radiation therapy 11/03/13- 12/19/13    prostate bed 6600 cGy 33 sessions  . 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 Hypertension     Past Surgical History  Procedure Laterality Date  . Retropubic prostatectomy  03/02/2001    Gleason 7  . 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  Visit Diagnosis: Dysphonia      Subjective Assessment - 03/22/15 1048    Subjective The patient reports that he is using his loud voice   Currently in Pain? No/denies               ADULT SLP TREATMENT - 03/22/15 0001    General Information   Behavior/Cognition Alert;Cooperative;Pleasant mood   HPI Parkinson's disease   Treatment Provided   Treatment provided Cognitive-Linquistic   Pain Assessment   Pain Assessment No/denies pain   Cognitive-Linquistic Treatment   Treatment focused on Voice   Skilled Treatment Daily Task #1: Average 19 seconds, 85 dB. Daily Task 2: Highs: 15 high pitched "ah" given no cues. Lows: 15 low pitched "ah" given no cues. Daily task #3: Average 75 dB.  Hierarchal speech loudness drill: lengthy reading, 75 dB.  Structured conversation, 75 dB.  Homework: assignments  completed.  Off the cuff remarks: average 74 dB.   Assessment / Recommendations / Plan   Plan Discharge SLP treatment due to (comment);All goals met   Progression Toward Goals   Progression toward goals Goals met, education completed, patient discharged from [facility] - 03/22/15 1048    Education provided Yes   Education Details LSVT-LOUD   Person(s) Educated Patient   Methods Explanation;Handout   Comprehension Verbalized understanding;Returned demonstration            SLP Long Term Goals - 03/22/15 1049    SLP LONG TERM GOAL #1   Title The patient will complete Daily Tasks (Maximum duration "ah", High/Lows, and Functional Phrases) at average loudness of 80 dB and with loud, good quality voice.    Status Achieved   SLP LONG TERM GOAL #2   Title The patient will complete Hierarchal Speech Loudness reading drills (words/phrases, sentences, and paragraph) at average 75 dB and with loud, good quality voice.     Status Achieved   SLP LONG TERM GOAL #3   Title The patient will complete homework daily.   Status Achieved   SLP LONG TERM GOAL #4   Title The patient will participate in conversation, maintaining average loudness of 75 dB and loud, good quality voice.   Status Achieved  Plan - 2015/04/10 1048    Clinical Impression Statement The patient has completed the LSVT-LOUD program and has met his goals.  The patient is completing daily tasks and hierarchal speech drill tasks with loud, good quality voice. The patient is exhibiting increased facial gestures. He is demonstrating generalization into conversational speech.   Speech Therapy Frequency Other (comment)  Discharge   Treatment/Interventions Other (comment)  LSVT-LOUD   Potential to Achieve Goals Good   Potential Considerations Ability to learn/carryover information;Cooperation/participation level;Previous level of function;Severity of impairments;Family/community support   SLP Home Exercise  Plan LSVT-LOUD "forever" homework   Consulted and Agree with Plan of Care Patient          G-Codes - 2015/04/10 1050    Functional Assessment Tool Used LSVT-LOUD protocol   Functional Limitations Voice   Voice Current Status (G9171) At least 1 percent but less than 20 percent impaired, limited or restricted   Voice Goal Status (E0761) At least 1 percent but less than 20 percent impaired, limited or restricted   Voice Discharge Status (N1550) At least 1 percent but less than 20 percent impaired, limited or restricted      Problem List Patient Active Problem List   Diagnosis Date Noted  . Essential (primary) hypertension 01/29/2015  . Class 1 obesity 01/29/2015  . H/O malignant neoplasm of prostate 12/15/2014  . 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 Diabetes mellitus, type 2 ([place]) 02/07/2014  . Type 2 diabetes mellitus ([place]) 02/07/2014  . Bladder neck obstruction 01/02/2014  . Bladder retention 01/02/2014  . Abnormal prostate specific antigen 09/29/2011  . Elevated prostate specific antigen (PSA) 09/29/2011  . CA of prostate ([place]) 04/07/2011  . 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 Malignant neoplasm of prostate ([place]) 04/07/2011   [name], MS/CCC- SLP  Lou Miner 04/10/2015, 10:50 AM  [place] MAIN [facility] SERVICES [address] [place], [place], [address] Phone: [phone]   Fax:  [phone]   Name: Chad Sandoval MRN: [id number] Date of Birth: 1945/04/21

## 2015-03-22 NOTE — Therapy (Signed)
[place] MAIN [facility] SERVICES [address] [place], [place], [address] Phone: [phone]   Fax:  [phone]  Occupational Therapy Treatment  Patient Details  Name: Chad Sandoval MRN: [id number] Date of Birth: August 14, 1945 Referring Provider: Manuella Ghazi  Encounter Date: 03/21/2015      OT End of Session - 03/22/15 1529    Visit Number 14   Number of Visits 17   Date for OT Re-Evaluation 03/27/15   Authorization Type medicare G codes 14   OT Start Time 1100   OT Stop Time 1155   OT Time Calculation (min) 55 min   Activity Tolerance Patient tolerated treatment well   Behavior During Therapy [facility] for tasks assessed/performed      Past Medical History  Diagnosis Date  . Prostate cancer [facility]) 2003    s/p radical prostatectomy  . Thrombocytopenia ([place])   . History of radiation therapy 11/03/13- 12/19/13    prostate bed 6600 cGy 33 sessions  . 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 Hypertension     Past Surgical History  Procedure Laterality Date  . Retropubic prostatectomy  03/02/2001    Gleason 7  . Splenectomy  04/2012    [facility]    There were no vitals filed for this visit.  Visit Diagnosis:  Muscle weakness (generalized)  Self-care deficit for dressing and grooming  Difficulty walking  Lack of coordination  Unsteady gait      Subjective Assessment - 03/21/15 1522    Subjective  Patient reports he feels he is doing much better and is prepared for tomorrows discharge from therapy.  He has been working on exercises at home and able to add in hand flicks and progressions this week.    Pertinent History Patient reports he was recently diagnosed with Parkinson's disease in December 2016.  He reports noticing decreased ability to play golf, his chipping and putting worsened in the last few months.  He recognized decreased coordination skills, right hand tremors and decreased legibility with handwriting.  He  reports "scuffing his foot" at times.  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     Patient Stated Goals Patient reports he wants to remain independent with his daily tasks, continue to work part time, wants his walking to be improved and get his golf game back again.   Currently in Pain? No/denies   Pain Score 0-No pain                      OT Treatments/Exercises (OP) - 03/21/15 1523    ADLs   ADL Comments Patient seen for functional component tasks of : managing small buttons, motion of throwing a ball, sit to stand from a variety of surfaces, handwriting and hand to mouth patterns for managing utensils. All performed for 5 repetitions.  Reviewed all functional component tasks and how the relate to hierarchy tasks in daily performance patterns.  Patient reports significant gains in the area of handwriting of signature, printed name and address.  Applied to check writing with 100% accuracy this date.    Neurological Re-education Exercises   Other Exercises 1 Patient seen for instruction of LSVT BIG exercises: LSVT Daily Session Maximal Daily Exercises: Sustained movements are designed to rescale the amplitude of movement output for generalization to daily functional activities. Performed as follows for 1 set of 10 repetitions each: Multi directional sustained movements-  1) Floor to ceiling, 2) Side to side. Multi directional Repetitive movements performed in standing and are designed to provide retraining effort needed for sustained muscle activation in tasks Performed as follows: 3) Step and reach forward, 4) Step and Reach Backwards, 5) Step and reach sideways, 6) Rock and reach forward/backward, 7) Rock and reach sideways. Sit to stand from mat table on lowest setting with cues for weight shift, technique, supervision for 5 reps for 2 sets. Patient seen for functional mobility tasks this date with emphasis on gait speed, length of  steps with short distance ambulation. All exercises performed this date with distant supervision, one cue for size of arm swing on the right with rock and reach.  Standing exercises performed without assistance.   Other Exercises 2 Stair negotiation 5 reps with cues, reciprocal toe tapping on stairs for 10 reps, leg press with 180# for 20 reps for 2 sets. Functional mobility for 1250 feet with cues for utilizing BIG principles and reciprocal arm swings. Balance tasks in standing with tennis ball this date for bounce/catch, toss/catch, combo of 2 together and then bounce/catch while performing functional mobility with cues. Focused on sit to stand from all chairs, benches, swing, rocking chair and surfaces in and around the hospital and able to complete independently without the use of arms.  Squats to pick up items from the floor repeatedly without assistance and no loss of balance.                OT Education - 03/22/15 1528    Education provided Yes   Education Details LSVT BIG principles, HEP, pending discharge next date.   Person(s) Educated Patient   Methods Explanation;Demonstration;Verbal cues   Comprehension Verbal cues required;Returned demonstration;Verbalized understanding             OT Long Term Goals - 03/15/15 1345    OT LONG TERM GOAL #1   Title Patient will improve gait speed and endurance and be able to walk 1750 feet in 6 minutes to negotiate around the home and community safely in 4 weeks.   Time 4   Period Weeks   Status On-going   OT LONG TERM GOAL #2   Title Patient will complete HEP for maximal daily exercises with modified independence in 4 weeks   Time 4   Period Weeks   Status Partially Met   OT LONG TERM GOAL #3   Title Patient will transfer from sit to stand without the use of arms safely and independently from a variety of chairs/surfaces in 4 weeks.    Time 4   Status On-going   OT LONG TERM GOAL #4   Title Patient will be modified  independent with home and work tasks including squatting to pick up items from the floor.   Time 4   Period Weeks   Status Achieved   OT LONG TERM GOAL #5   Title Patient will demonstrate 90%legibility with handwriting for name and address with use of normal sized letters.   Time 4   Period Weeks   Status On-going               Plan - 03/22/15 1529    Clinical Impression Statement Patient has continued to progress in all aspects of care, plan for discharge next date, will perform final outcome measures and finalize home exercise plan and answer any remaining questions patient has in regards to future exercises and plan.     Pt will benefit from skilled  therapeutic intervention in order to improve on the following deficits (Retired) Impaired flexibility;Decreased coordination;Decreased mobility;Decreased endurance;Decreased range of motion;Decreased strength;Decreased balance;Difficulty walking;Impaired UE functional use   Rehab Potential Excellent   OT Frequency 4x / week   OT Duration 4 weeks   OT Treatment/Interventions Self-care/ADL training;Therapeutic exercise;Gait Training;Neuromuscular education;Stair Training;Functional Mobility Training;Patient/family education;Therapeutic activities;Balance training   Consulted and Agree with Plan of Care Patient        Problem List Patient Active Problem List   Diagnosis Date Noted  . Essential (primary) hypertension 01/29/2015  . Class 1 obesity 01/29/2015  . H/O malignant neoplasm of prostate 12/15/2014  . 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 Diabetes mellitus, type 2 ([place]) 02/07/2014  . Type 2 diabetes mellitus ([place]) 02/07/2014  . Bladder neck obstruction 01/02/2014  . Bladder retention 01/02/2014  . Abnormal prostate specific antigen 09/29/2011  . Elevated prostate specific antigen (PSA) 09/29/2011  . CA of prostate ([place]) 04/07/2011  . 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 Malignant  neoplasm of prostate [facility]) 04/07/2011   Amy T Lovett, OTR/L, CLT Lovett,Amy 03/22/2015, 3:32 PM  [place] MAIN [facility] SERVICES [address] [place], [place], [address] Phone: [phone]   Fax:  [phone]  Name: Chad Sandoval MRN: [id number] Date of Birth: 11/03/1945

## 2015-03-23 ENCOUNTER — Encounter: Payer: Medicare Other | Admitting: Occupational Therapy

## 2015-03-23 ENCOUNTER — Encounter: Payer: Medicare Other | Admitting: Speech Pathology

## 2015-03-23 NOTE — Therapy (Signed)
[place] MAIN [facility] SERVICES [address] [place], [place], [address] Phone: [phone]   Fax:  [phone]  Occupational Therapy Treatment/Discharge Summary  Seen from 02/28/2015 to 03/23/2015  Patient Details  Name: Chad Sandoval MRN: [id number] Date of Birth: [birth year] Referring Provider: Manuella Ghazi  Encounter Date: 03/22/2015      OT End of Session - 03/23/15 1434    Visit Number 15   Number of Visits 17   Date for OT Re-Evaluation 03/27/15   Authorization Type medicare G codes 15   OT Start Time 1055   OT Stop Time 1152   OT Time Calculation (min) 57 min   Activity Tolerance Patient tolerated treatment well   Behavior During Therapy [facility] for tasks assessed/performed      Past Medical History  Diagnosis Date  . Prostate cancer [facility]) 2003    s/p radical prostatectomy  . Thrombocytopenia ([place])   . History of radiation therapy 11/03/13- 12/19/13    prostate bed 6600 cGy 33 sessions  . 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 Hypertension     Past Surgical History  Procedure Laterality Date  . Retropubic prostatectomy  03/02/2001    Gleason 7  . Splenectomy  04/2012    [facility]    There were no vitals filed for this visit.  Visit Diagnosis:  Muscle weakness (generalized)  Self-care deficit for dressing and grooming  Difficulty walking  Lack of coordination  Unsteady gait      Subjective Assessment - 03/22/15 1431    Subjective  Patient reports he has made a lot of progress and plans to continue with exercises as directed.  He verbalizes understanding of increasing complexity of exercises over time.     Pertinent History Patient reports he was recently diagnosed with Parkinson's disease in December 2016.  He reports noticing decreased ability to play golf, his chipping and putting worsened in the last few months.  He recognized decreased coordination skills, right hand tremors and decreased legibility  with handwriting.  He reports "scuffing his foot" at times.  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     Patient Stated Goals Patient reports he wants to remain independent with his daily tasks, continue to work part time, wants his walking to be improved and get his golf game back again.   Currently in Pain? No/denies   Pain Score 0-No pain                      OT Treatments/Exercises (OP) - 03/22/15 1436    ADLs   ADL Comments Finalized functional component tasks and implementation into hierarchy tasks and patient is independent with all tasks this date.  May require occasional assistance for smallest of buttons on arm cuff at times. Handwriting samples 100% legible and able to complete a written check.    Neurological Re-education Exercises   Other Exercises 1 Patient seen for initial instruction of LSVT BIG exercises: LSVT Daily Session Maximal Daily Exercises: Sustained movements are designed to rescale the amplitude of movement output for generalization to daily functional activities. Performed as follows for 1 set of 10 repetitions each: Multi directional sustained movements- 1) Floor to ceiling, 2) Side to side. Multi directional Repetitive movements performed in standing and are designed to provide retraining effort needed for sustained muscle activation in tasks Performed as follows: 3) Step and reach forward, 4)  Step and Reach Backwards, 5) Step and reach sideways, 6) Rock and reach forward/backward, 7) Rock and reach sideways. Exercises performed independently this date.     Other Exercises 2 6 minute walk test 1950 feet, 5 times sit to stand 6 secs.  No freezing behaviors present, BERG balance test 56/56.                 OT Education - 03/22/15 1528    Education provided Yes   Education Details LSVT BIG principles, HEP, pending discharge next date.   Person(s) Educated Patient   Methods  Explanation;Demonstration;Verbal cues   Comprehension Verbal cues required;Returned demonstration;Verbalized understanding             OT Long Term Goals - 03/22/15 1440    OT LONG TERM GOAL #1   Title Patient will improve gait speed and endurance and be able to walk 1750 feet in 6 minutes to negotiate around the home and community safely in 4 weeks.   Baseline 1950 feet at discharge   Time 4   Period Weeks   Status Achieved   OT LONG TERM GOAL #2   Title Patient will complete HEP for maximal daily exercises with modified independence in 4 weeks   Time 4   Period Weeks   Status Achieved   OT LONG TERM GOAL #3   Title Patient will transfer from sit to stand without the use of arms safely and independently from a variety of chairs/surfaces in 4 weeks.    Baseline 6 secs for 5 times sit to stand at discharge   Time 4   Status Achieved   OT LONG TERM GOAL #4   Title Patient will be modified independent with home and work tasks including squatting to pick up items from the floor.   Time 4   Period Weeks   Status Achieved   OT LONG TERM GOAL #5   Title Patient will demonstrate 90%legibility with handwriting for name and address with use of normal sized letters.   Time 4   Period Weeks   Status Achieved               Plan - 03/22/15 1529    Clinical Impression Statement Patient has continued to progress in all aspects of care, plan for discharge next date, will perform final outcome measures and finalize home exercise plan and answer any remaining questions patient has in regards to future exercises and plan.     Pt will benefit from skilled therapeutic intervention in order to improve on the following deficits (Retired) Impaired flexibility;Decreased coordination;Decreased mobility;Decreased endurance;Decreased range of motion;Decreased strength;Decreased balance;Difficulty walking;Impaired UE functional use   Rehab Potential Excellent   OT Frequency 4x / week   OT  Duration 4 weeks   OT Treatment/Interventions Self-care/ADL training;Therapeutic exercise;Gait Training;Neuromuscular education;Stair Training;Functional Mobility Training;Patient/family education;Therapeutic activities;Balance training   Consulted and Agree with Plan of Care Patient          G-Codes - Apr 05, 2015 1435    Functional Assessment Tool Used 6 minute walk test, clinical judgment, 5 times sit to stand, BERG balance.   Functional Limitation Mobility: Walking and moving around   Mobility: Walking and Moving Around Goal Status [phone]) At least 1 percent but less than 20 percent impaired, limited or restricted   Mobility: Walking and Moving Around Discharge Status [phone]) At least 1 percent but less than 20 percent impaired, limited or restricted      Problem List Patient Active Problem List  Diagnosis Date Noted  . Essential (primary) hypertension 01/29/2015  . Class 1 obesity 01/29/2015  . H/O malignant neoplasm of prostate 12/15/2014  . 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 Diabetes mellitus, type 2 ([place]) 02/07/2014  . Type 2 diabetes mellitus ([place]) 02/07/2014  . Bladder neck obstruction 01/02/2014  . Bladder retention 01/02/2014  . Abnormal prostate specific antigen 09/29/2011  . Elevated prostate specific antigen (PSA) 09/29/2011  . CA of prostate ([place]) 04/07/2011  . 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 Malignant neoplasm of prostate [facility]) 04/07/2011   Jillienne Egner T Zaeden Lastinger, OTR/L, CLT  Crewe Heathman 03/23/2015, 2:46 PM  [place] MAIN [facility] SERVICES [address] [place], [place], [address] Phone: [phone]   Fax:  [phone]  Name: Chad Sandoval MRN: [id number] Date of Birth: 15-Mar-1945

## 2015-03-26 ENCOUNTER — Encounter: Payer: Medicare Other | Admitting: Occupational Therapy

## 2015-03-26 ENCOUNTER — Encounter: Payer: Medicare Other | Admitting: Speech Pathology

## 2015-03-27 ENCOUNTER — Encounter: Payer: Medicare Other | Admitting: Speech Pathology

## 2015-03-27 ENCOUNTER — Encounter: Payer: Medicare Other | Admitting: Occupational Therapy

## 2015-03-28 ENCOUNTER — Encounter: Payer: Medicare Other | Admitting: Speech Pathology

## 2015-03-28 ENCOUNTER — Encounter: Payer: Medicare Other | Admitting: Occupational Therapy

## 2015-03-29 ENCOUNTER — Encounter: Payer: Medicare Other | Admitting: Speech Pathology

## 2015-03-29 ENCOUNTER — Encounter: Payer: Medicare Other | Admitting: Occupational Therapy

## 2015-04-02 ENCOUNTER — Encounter: Payer: Medicare Other | Admitting: Occupational Therapy

## 2015-04-02 ENCOUNTER — Encounter: Payer: Medicare Other | Admitting: Speech Pathology

## 2015-04-03 ENCOUNTER — Encounter: Payer: Medicare Other | Admitting: Speech Pathology

## 2015-04-03 ENCOUNTER — Encounter: Payer: Medicare Other | Admitting: Occupational Therapy

## 2015-04-04 ENCOUNTER — Encounter: Payer: Medicare Other | Admitting: Speech Pathology

## 2015-04-04 ENCOUNTER — Encounter: Payer: Medicare Other | Admitting: Occupational Therapy

## 2015-04-05 ENCOUNTER — Encounter: Payer: Medicare Other | Admitting: Occupational Therapy

## 2015-04-05 ENCOUNTER — Encounter: Payer: Medicare Other | Admitting: Speech Pathology

## 2015-08-02 ENCOUNTER — Encounter: Payer: Self-pay | Admitting: Emergency Medicine

## 2015-08-02 ENCOUNTER — Emergency Department
Admission: EM | Admit: 2015-08-02 | Discharge: 2015-08-02 | Disposition: A | Discharge status: Home / Self Care | Payer: Medicare Other | Attending: Emergency Medicine | Admitting: Emergency Medicine

## 2015-08-02 DIAGNOSIS — R339 Retention of urine, unspecified: Secondary | ICD-10-CM | POA: Diagnosis present

## 2015-08-02 DIAGNOSIS — D72829 Elevated white blood cell count, unspecified: Secondary | ICD-10-CM | POA: Insufficient documentation

## 2015-08-02 DIAGNOSIS — I10 Essential (primary) hypertension: Secondary | ICD-10-CM | POA: Diagnosis not present

## 2015-08-02 DIAGNOSIS — Z79899 Other long term (current) drug therapy: Secondary | ICD-10-CM | POA: Diagnosis not present

## 2015-08-02 DIAGNOSIS — N3001 Acute cystitis with hematuria: Secondary | ICD-10-CM | POA: Insufficient documentation

## 2015-08-02 DIAGNOSIS — N3090 Cystitis, unspecified without hematuria: Secondary | ICD-10-CM

## 2015-08-02 DIAGNOSIS — Z8546 Personal history of malignant neoplasm of prostate: Secondary | ICD-10-CM | POA: Diagnosis not present

## 2015-08-02 DIAGNOSIS — E119 Type 2 diabetes mellitus without complications: Secondary | ICD-10-CM | POA: Insufficient documentation

## 2015-08-02 DIAGNOSIS — Z7982 Long term (current) use of aspirin: Secondary | ICD-10-CM | POA: Diagnosis not present

## 2015-08-02 LAB — URINALYSIS COMPLETE WITH MICROSCOPIC (ARMC ONLY)
BACTERIA UA: NONE SEEN
Bilirubin Urine: NEGATIVE
GLUCOSE, UA: NEGATIVE mg/dL
Leukocytes, UA: NEGATIVE
Nitrite: NEGATIVE
Protein, ur: 100 mg/dL — AB
SQUAMOUS EPITHELIAL / LPF: NONE SEEN
Specific Gravity, Urine: 1.021 (ref 1.005–1.030)
pH: 6 (ref 5.0–8.0)

## 2015-08-02 LAB — CBC WITH DIFFERENTIAL/PLATELET
Basophils Absolute: 0.1 10*3/uL (ref 0–0.1)
Eosinophils Absolute: 0.1 10*3/uL (ref 0–0.7)
Eosinophils Relative: 0 %
HEMATOCRIT: 47.9 % (ref 40.0–52.0)
Hemoglobin: 16 g/dL (ref 13.0–18.0)
Lymphs Abs: 2.8 10*3/uL (ref 1.0–3.6)
MCH: 31.1 pg (ref 26.0–34.0)
MCHC: 33.5 g/dL (ref 32.0–36.0)
MCV: 93.1 fL (ref 80.0–100.0)
MONO ABS: 1.3 10*3/uL — AB (ref 0.2–1.0)
NEUTROS ABS: 18 10*3/uL — AB (ref 1.4–6.5)
Neutrophils Relative %: 80 %
Platelets: 248 10*3/uL (ref 150–440)
RBC: 5.14 MIL/uL (ref 4.40–5.90)
RDW: 13.3 % (ref 11.5–14.5)
WBC: 22.3 10*3/uL — ABNORMAL HIGH (ref 3.8–10.6)

## 2015-08-02 LAB — BASIC METABOLIC PANEL
Anion gap: 10 (ref 5–15)
BUN: 36 mg/dL — AB (ref 6–20)
CALCIUM: 9.6 mg/dL (ref 8.9–10.3)
CO2: 22 mmol/L (ref 22–32)
CREATININE: 1.37 mg/dL — AB (ref 0.61–1.24)
Chloride: 109 mmol/L (ref 101–111)
GFR calc Af Amer: 59 mL/min — ABNORMAL LOW (ref >60)
GFR calc non Af Amer: 51 mL/min — ABNORMAL LOW (ref >60)
GLUCOSE: 116 mg/dL — AB (ref 65–99)
Potassium: 4.1 mmol/L (ref 3.5–5.1)
Sodium: 141 mmol/L (ref 135–145)

## 2015-08-02 MED ORDER — CIPROFLOXACIN HCL 500 MG PO TABS: 500.0000 mg | ORAL_TABLET | Freq: Two times a day (BID) | ORAL | Status: AC

## 2015-08-02 NOTE — ED Provider Notes (Signed)
[facility] Emergency Department Provider Note        Time seen: ----------------------------------------- 5:07 PM on 08/02/2015 -----------------------------------------    I have reviewed the triage vital signs and the nursing notes.   HISTORY  Chief Complaint No chief complaint on file.    HPI Chad Sandoval is a 70 y.o. male who presents the ER for urinary retention. Patient states she's been unable to urinate since about 11 AM. Patient had similar problems in the past and it has required a procedure by the urologist. Prior to my evaluation nursing attempted to place a coud catheter unsuccessfully. Patient was able to urinate after the attempt. He denies fevers, chills or other complaints. He currently has no other pain.   Past Medical History  Diagnosis Date  . Prostate cancer [facility]) 2003    s/p radical prostatectomy  . Thrombocytopenia ([place])   . History of radiation therapy 11/03/13- 12/19/13    prostate bed 6600 cGy 33 sessions  . 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 Hypertension     Patient Active Problem List   Diagnosis Date Noted  . Essential (primary) hypertension 01/29/2015  . Class 1 obesity 01/29/2015  . H/O malignant neoplasm of prostate 12/15/2014  . 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 Diabetes mellitus, type 2 ([place]) 02/07/2014  . Type 2 diabetes mellitus ([place]) 02/07/2014  . Bladder neck obstruction 01/02/2014  . Bladder retention 01/02/2014  . Abnormal prostate specific antigen 09/29/2011  . Elevated prostate specific antigen (PSA) 09/29/2011  . CA of prostate ([place]) 04/07/2011  . 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 Malignant neoplasm of prostate ([place]) 04/07/2011    Past Surgical History  Procedure Laterality Date  . Retropubic prostatectomy  03/02/2001    Gleason 7  . 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 Smoking status: Never Smoker   . Smokeless tobacco: None  . Alcohol Use: No    Review of Systems Constitutional: Negative for fever. Cardiovascular: Negative for chest pain. Respiratory: Negative for shortness of breath. Gastrointestinal: Negative for abdominal pain, vomiting and diarrhea. Genitourinary: Positive for urinary retention, hematuria Musculoskeletal: Negative for back pain. Skin: Negative for rash. Neurological: Negative for headaches, focal weakness or numbness.  10-point ROS otherwise negative.  ____________________________________________   PHYSICAL EXAM:  VITAL SIGNS: ED Triage Vitals  Enc Vitals Group     BP 08/02/15 1622 198/104 mmHg     Pulse Rate 08/02/15 1622 112     Resp 08/02/15 1622 94     Temp 08/02/15 1622 98.2 F (36.8 C)     Temp src --      SpO2 08/02/15 1622 95 %     Weight 08/02/15 1622 225 lb (102.059 kg)     Height 08/02/15 1622 5\' 9"  (1.753 m)     Head Cir --      Peak Flow --      Pain Score 08/02/15 1623 10     Pain Loc --      Pain Edu? --      Excl. in [place]? --     Constitutional: Alert and oriented. Well appearing and in no distress. Eyes: Conjunctivae are normal. PERRL. Normal extraocular movements. ENT   Head: Normocephalic and atraumatic.   Nose: No congestion/rhinnorhea.   Mouth/Throat: Mucous membranes are moist.   Neck: No stridor. Cardiovascular: Normal rate,  regular rhythm. No murmurs, rubs, or gallops. Respiratory: Normal respiratory effort without tachypnea nor retractions. Breath sounds are clear and equal bilaterally. No wheezes/rales/rhonchi. Gastrointestinal: Soft and nontender. Normal bowel sounds Musculoskeletal: Nontender with normal range of motion in all extremities. No lower extremity tenderness nor edema. Neurologic:  Normal speech and language. No gross focal neurologic deficits are appreciated.  Skin:  Skin is warm, dry and intact. No rash  noted. Psychiatric: Mood and affect are normal. Speech and behavior are normal.   ____________________________________________  ED COURSE:  Pertinent labs & imaging results that were available during my care of the patient were reviewed by me and considered in my medical decision making (see chart for details). Patient is in no acute distress, we will check basic labs, bladder scan. I will discuss with urology. ____________________________________________    LABS (pertinent positives/negatives)  Labs Reviewed  CBC WITH DIFFERENTIAL/PLATELET - Abnormal; Notable for the following:    WBC 22.3 (*)    Neutro Abs 18.0 (*)    Monocytes Absolute 1.3 (*)    All other components within normal limits  BASIC METABOLIC PANEL - Abnormal; Notable for the following:    Glucose, Bld 116 (*)    BUN 36 (*)    Creatinine, Ser 1.37 (*)    GFR calc non Af Amer 51 (*)    GFR calc Af Amer 59 (*)    All other components within normal limits  URINALYSIS COMPLETEWITH MICROSCOPIC ([facility] ONLY) - Abnormal; Notable for the following:    Color, Urine AMBER (*)    APPearance CLOUDY (*)    Ketones, ur TRACE (*)    Hgb urine dipstick 3+ (*)    Protein, ur 100 (*)    All other components within normal limits   ____________________________________________  FINAL ASSESSMENT AND PLAN  Urinary retention, Leukocytosis  Plan: Patient with labs and imaging as dictated above. Patient has not had fevers or chills. His only complaint has been urinary retention. Case is been discussed with urology who recommends not placing a Foley catheter. He has been able to urinate while he has been here. He will be discharged on Cipro and advised to talk to his urologist tomorrow which he agrees to do.   Earleen Newport, MD   Note: This dictation was prepared with Dragon dictation. Any transcriptional errors that result from this process are unintentional   Earleen Newport, MD 08/02/15 1901

## 2015-08-02 NOTE — ED Notes (Signed)
Attempted to place 16 fr Coude catheter.  Unable to pass catheter.  Catheter had bloody drainage to tip when removed from penis.  Patient incontinent moderate amount of urine in triage after catheter attempt.  Placed in ED 15 patient voided small amount.

## 2015-08-02 NOTE — Discharge Instructions (Signed)
Acute Urinary Retention, Male Acute urinary retention is the temporary inability to urinate. This is a common problem in older men. As men age their prostates become larger and block the flow of urine from the bladder. This is usually a problem that has come on gradually.  HOME CARE INSTRUCTIONS If you are sent home with a Foley catheter and a drainage system, you will need to discuss the best course of action with your health care provider. While the catheter is in, maintain a good intake of fluids. Keep the drainage bag emptied and lower than your catheter. This is so that contaminated urine will not flow back into your bladder, which could lead to a urinary tract infection. There are two main types of drainage bags. One is a large bag that usually is used at night. It has a good capacity that will allow you to sleep through the night without having to empty it. The second type is called a leg bag. It has a smaller capacity, so it needs to be emptied more frequently. However, the main advantage is that it can be attached by a leg strap and can go underneath your clothing, allowing you the freedom to move about or leave your home. Only take over-the-counter or prescription medicines for pain, discomfort, or fever as directed by your health care provider.  SEEK MEDICAL CARE IF:  You develop a low-grade fever.  You experience spasms or leakage of urine with the spasms. SEEK IMMEDIATE MEDICAL CARE IF:   You develop chills or fever.  Your catheter stops draining urine.  Your catheter falls out.  You start to develop increased bleeding that does not respond to rest and increased fluid intake. MAKE SURE YOU:  Understand these instructions.  Will watch your condition.  Will get help right away if you are not doing well or get worse.   This information is not intended to replace advice given to you by your health care provider. Make sure you discuss any questions you have with your health care  provider.   Document Released: 05/19/2000 Document Revised: 06/27/2014 Document Reviewed: 07/22/2012 Elsevier Interactive Patient Education 2016 Elsevier Inc.  Urinary Tract Infection Urinary tract infections (UTIs) can develop anywhere along your urinary tract. Your urinary tract is your body's drainage system for removing wastes and extra water. Your urinary tract includes two kidneys, two ureters, a bladder, and a urethra. Your kidneys are a pair of bean-shaped organs. Each kidney is about the size of your fist. They are located below your ribs, one on each side of your spine. CAUSES Infections are caused by microbes, which are microscopic organisms, including fungi, viruses, and bacteria. These organisms are so small that they can only be seen through a microscope. Bacteria are the microbes that most commonly cause UTIs. SYMPTOMS  Symptoms of UTIs may vary by age and gender of the patient and by the location of the infection. Symptoms in young women typically include a frequent and intense urge to urinate and a painful, burning feeling in the bladder or urethra during urination. Older women and men are more likely to be tired, shaky, and weak and have muscle aches and abdominal pain. A fever may mean the infection is in your kidneys. Other symptoms of a kidney infection include pain in your back or sides below the ribs, nausea, and vomiting. DIAGNOSIS To diagnose a UTI, your caregiver will ask you about your symptoms. Your caregiver will also ask you to provide a urine sample. The urine sample  sample will be tested for bacteria and white blood cells. White blood cells are made by your body to help fight infection. °TREATMENT  °Typically, UTIs can be treated with medication. Because most UTIs are caused by a bacterial infection, they usually can be treated with the use of antibiotics. The choice of antibiotic and length of treatment depend on your symptoms and the type of bacteria causing your  infection. °HOME CARE INSTRUCTIONS °· If you were prescribed antibiotics, take them exactly as your caregiver instructs you. Finish the medication even if you feel better after you have only taken some of the medication. °· Drink enough water and fluids to keep your urine clear or pale yellow. °· Avoid caffeine, tea, and carbonated beverages. They tend to irritate your bladder. °· Empty your bladder often. Avoid holding urine for long periods of time. °· Empty your bladder before and after sexual intercourse. °· After a bowel movement, women should cleanse from front to back. Use each tissue only once. °SEEK MEDICAL CARE IF:  °· You have back pain. °· You develop a fever. °· Your symptoms do not begin to resolve within 3 days. °SEEK IMMEDIATE MEDICAL CARE IF:  °· You have severe back pain or lower abdominal pain. °· You develop chills. °· You have nausea or vomiting. °· You have continued burning or discomfort with urination. °MAKE SURE YOU:  °· Understand these instructions. °· Will watch your condition. °· Will get help right away if you are not doing well or get worse. °  °This information is not intended to replace advice given to you by your health care provider. Make sure you discuss any questions you have with your health care provider. °  °Document Released: 11/20/2004 Document Revised: 11/01/2014 Document Reviewed: 03/21/2011 °Elsevier Interactive Patient Education ©2016 Elsevier Inc. ° °

## 2015-08-02 NOTE — ED Notes (Signed)
Unable to urinate.  Last able to void was at 1100 - 1130.  Patient states he has had similar symptoms in the past and the urologist "bust open the problem".

## 2015-08-27 ENCOUNTER — Inpatient Hospital Stay (HOSPITAL_BASED_OUTPATIENT_CLINIC_OR_DEPARTMENT_OTHER): Payer: Medicare Other | Admitting: Internal Medicine

## 2015-08-27 ENCOUNTER — Ambulatory Visit: Payer: Medicare Other | Admitting: Oncology

## 2015-08-27 ENCOUNTER — Inpatient Hospital Stay: Payer: Medicare Other | Attending: Internal Medicine

## 2015-08-27 VITALS — BP 150/99 | HR 76 | Temp 97.0°F | Resp 18 | Wt 223.8 lb

## 2015-08-27 DIAGNOSIS — Z8546 Personal history of malignant neoplasm of prostate: Secondary | ICD-10-CM | POA: Insufficient documentation

## 2015-08-27 DIAGNOSIS — I10 Essential (primary) hypertension: Secondary | ICD-10-CM

## 2015-08-27 DIAGNOSIS — Z9081 Acquired absence of spleen: Secondary | ICD-10-CM

## 2015-08-27 DIAGNOSIS — Z7982 Long term (current) use of aspirin: Secondary | ICD-10-CM

## 2015-08-27 DIAGNOSIS — G20 Parkinson's disease: Secondary | ICD-10-CM | POA: Insufficient documentation

## 2015-08-27 DIAGNOSIS — C8307 Small cell B-cell lymphoma, spleen: Secondary | ICD-10-CM | POA: Diagnosis not present

## 2015-08-27 DIAGNOSIS — Z79899 Other long term (current) drug therapy: Secondary | ICD-10-CM

## 2015-08-27 DIAGNOSIS — Z923 Personal history of irradiation: Secondary | ICD-10-CM | POA: Diagnosis not present

## 2015-08-27 DIAGNOSIS — Z9079 Acquired absence of other genital organ(s): Secondary | ICD-10-CM | POA: Insufficient documentation

## 2015-08-27 LAB — CBC WITH DIFFERENTIAL/PLATELET
BASOS ABS: 0.1 10*3/uL (ref 0–0.1)
Basophils Relative: 1 %
Eosinophils Absolute: 0.1 10*3/uL (ref 0–0.7)
Eosinophils Relative: 1 %
HEMATOCRIT: 47.8 % (ref 40.0–52.0)
Hemoglobin: 16.4 g/dL (ref 13.0–18.0)
LYMPHS ABS: 2.7 10*3/uL (ref 1.0–3.6)
LYMPHS PCT: 25 %
MCH: 31.6 pg (ref 26.0–34.0)
MCHC: 34.3 g/dL (ref 32.0–36.0)
MCV: 92.2 fL (ref 80.0–100.0)
MONO ABS: 0.9 10*3/uL (ref 0.2–1.0)
Monocytes Relative: 8 %
Neutro Abs: 7.1 10*3/uL — ABNORMAL HIGH (ref 1.4–6.5)
Neutrophils Relative %: 65 %
Platelets: 250 10*3/uL (ref 150–440)
RBC: 5.19 MIL/uL (ref 4.40–5.90)
RDW: 13.1 % (ref 11.5–14.5)
WBC: 10.9 10*3/uL — ABNORMAL HIGH (ref 3.8–10.6)

## 2015-08-27 LAB — COMPREHENSIVE METABOLIC PANEL
ALT: 7 U/L — AB (ref 17–63)
AST: 28 U/L (ref 15–41)
Albumin: 5 g/dL (ref 3.5–5.0)
Alkaline Phosphatase: 80 U/L (ref 38–126)
Anion gap: 4 — ABNORMAL LOW (ref 5–15)
BILIRUBIN TOTAL: 0.9 mg/dL (ref 0.3–1.2)
BUN: 26 mg/dL — AB (ref 6–20)
CO2: 28 mmol/L (ref 22–32)
CREATININE: 0.98 mg/dL (ref 0.61–1.24)
Calcium: 9.8 mg/dL (ref 8.9–10.3)
Chloride: 107 mmol/L (ref 101–111)
GFR calc Af Amer: >60 mL/min (ref >60)
Glucose, Bld: 120 mg/dL — ABNORMAL HIGH (ref 65–99)
Potassium: 4.7 mmol/L (ref 3.5–5.1)
Sodium: 139 mmol/L (ref 135–145)
TOTAL PROTEIN: 7.4 g/dL (ref 6.5–8.1)

## 2015-08-27 LAB — LACTATE DEHYDROGENASE: LDH: 159 U/L (ref 98–192)

## 2015-08-27 LAB — SEDIMENTATION RATE: Sed Rate: 1 mm/hr (ref 0–20)

## 2015-08-27 NOTE — Progress Notes (Signed)
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 2.Bone marrow biopsy (October, 2012)  No diagnostic features suggestive of myeloproliferative disease.   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 I reviewed the patient's prior charts/pertinent labs/imaging in detail; findings are summarized above.    Chad Sandoval 70 y.o.  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  Patient denies any unusual weight loss. Denies any unusual fevers. Lumps or bumps.   REVIEW OF SYSTEMS:  A complete 10 point review of system is done which is negative except mentioned above/history of present illness.   PAST MEDICAL HISTORY :  Past Medical History   Diagnosis Date  . Prostate cancer [facility]) 2003    s/p radical prostatectomy  . Thrombocytopenia ([place])   . History of radiation therapy 11/03/13- 12/19/13    prostate bed 6600 cGy 33 sessions  . 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 Hypertension     PAST SURGICAL HISTORY :   Past Surgical History  Procedure Laterality Date  . Retropubic prostatectomy  03/02/2001    Gleason 7  . Splenectomy  04/2012    [facility]    FAMILY HISTORY :   Family History  Problem Relation Age of Onset  . Cancer Father     prostate  . Alzheimer's disease Mother     SOCIAL HISTORY:   Social History  Substance Use Topics  . Smoking status: Never Smoker   . Smokeless tobacco: Not on file  . Alcohol Use: No    ALLERGIES:  is allergic to oxycontin.  MEDICATIONS:  Current Outpatient Prescriptions  Medication Sig Dispense Refill  . acetaminophen (TYLENOL) 500 MG tablet Take 500 mg by mouth every 6 (six) hours as needed.    Marland Kitchen aspirin 81 MG chewable tablet Chew 81 mg by mouth.    . B Complex Vitamins (VITAMIN-B COMPLEX) TABS Take by mouth.    . Bromfenac Sodium (PROLENSA) 0.07 % SOLN     . carbidopa-levodopa (SINEMET IR) 25-100 MG tablet TAKE 1 TABLET BY MOUTH 3 (THREE) TIMES DAILY.  8  . Cholecalciferol (VITAMIN D3) 10000 UNITS TABS Take by mouth.    . DUREZOL 0.05 % EMUL INSTILL 1 DROP IN LEFT EYE 4 TIMES DAILY AFTER SURGERY  0  .  lisinopril (PRINIVIL,ZESTRIL) 10 MG tablet Take by mouth.    . Multiple Vitamin (MULTI-VITAMINS) TABS Take 1 tablet by mouth.    . omega-3 acid ethyl esters (LOVAZA) 1 g capsule Take by mouth 2 (two) times daily.    . sildenafil (VIAGRA) 100 MG tablet Take 100 mg by mouth.     No current facility-administered medications for this visit.    PHYSICAL EXAMINATION: ECOG PERFORMANCE STATUS: 0 - Asymptomatic  BP 150/99 mmHg  Pulse 76  Temp(Src) 97 F (36.1 C) (Tympanic)  Resp 18  Wt 223 lb 12.3 oz (101.5 kg)  Filed Weights   08/27/15 1057   Weight: 223 lb 12.3 oz (101.5 kg)    GENERAL: Well-nourished well-developed; Alert, no distress and comfortable.   With his wife. EYES: no pallor or icterus OROPHARYNX: no thrush or ulceration; good dentition  NECK: supple, no masses felt LYMPH:  no palpable lymphadenopathy in the cervical, axillary or inguinal regions LUNGS: clear to auscultation and  No wheeze or crackles HEART/CVS: regular rate & rhythm and no murmurs; No lower extremity edema ABDOMEN:abdomen soft, non-tender and normal bowel sounds Musculoskeletal:no cyanosis of digits and no clubbing  PSYCH: alert & oriented x 3 with fluent speech NEURO: no focal motor/sensory deficits SKIN:  no rashes or significant lesions  LABORATORY DATA:  I have reviewed the data as listed    Component Value Date/Time   NA 139 08/27/2015 1017   NA 143 01/25/2014 1128   K 4.7 08/27/2015 1017   K 4.3 01/25/2014 1128   CL 107 08/27/2015 1017   CL 106 01/25/2014 1128   CO2 28 08/27/2015 1017   CO2 29 01/25/2014 1128   GLUCOSE 120* 08/27/2015 1017   GLUCOSE 111* 01/25/2014 1128   BUN 26* 08/27/2015 1017   BUN 14 01/25/2014 1128   CREATININE 0.98 08/27/2015 1017   CREATININE 1.03 01/25/2014 1128   CALCIUM 9.8 08/27/2015 1017   CALCIUM 9.5 01/25/2014 1128   PROT 7.4 08/27/2015 1017   PROT 6.8 01/25/2014 1128   ALBUMIN 5.0 08/27/2015 1017   ALBUMIN 4.1 01/25/2014 1128   AST 28 08/27/2015 1017   AST 22 01/25/2014 1128   ALT 7* 08/27/2015 1017   ALT 43 01/25/2014 1128   ALKPHOS 80 08/27/2015 1017   ALKPHOS 114 01/25/2014 1128   BILITOT 0.9 08/27/2015 1017   BILITOT 0.7 01/25/2014 1128   GFRNONAA >60 08/27/2015 1017   GFRNONAA >60 01/25/2014 1128   GFRNONAA >60 07/13/2013 0951   GFRAA >60 08/27/2015 1017   GFRAA >60 01/25/2014 1128   GFRAA >60 07/13/2013 0951    No results found for: SPEP, UPEP  Lab Results  Component Value Date   WBC 10.9* 08/27/2015   NEUTROABS 7.1* 08/27/2015   HGB 16.4 08/27/2015   HCT 47.8  08/27/2015   MCV 92.2 08/27/2015   PLT 250 08/27/2015      Chemistry      Component Value Date/Time   NA 139 08/27/2015 1017   NA 143 01/25/2014 1128   K 4.7 08/27/2015 1017   K 4.3 01/25/2014 1128   CL 107 08/27/2015 1017   CL 106 01/25/2014 1128   CO2 28 08/27/2015 1017   CO2 29 01/25/2014 1128   BUN 26* 08/27/2015 1017   BUN 14 01/25/2014 1128   CREATININE 0.98 08/27/2015 1017   CREATININE 1.03 01/25/2014 1128      Component Value Date/Time   CALCIUM 9.8 08/27/2015 1017   CALCIUM 9.5 01/25/2014 1128   ALKPHOS 80 08/27/2015  1017   ALKPHOS 114 01/25/2014 1128   AST 28 08/27/2015 1017   AST 22 01/25/2014 1128   ALT 7* 08/27/2015 1017   ALT 43 01/25/2014 1128   BILITOT 0.9 08/27/2015 1017   BILITOT 0.7 01/25/2014 1128       RADIOGRAPHIC STUDIES: I have personally reviewed the radiological images as listed and agreed with the findings in the report. No results found.   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 Continue monitor for now. CBC within normal limits.  # Prostate cancer- with recurrence in 2014 in the prostate bed status post radiation. Currently being followed by PSA by his urologist.   # Follow-up with me in about 6 months with labs; no imaging needed.    Orders Placed This Encounter  Procedures  . CBC with Differential    Standing Status: Future     Number of Occurrences:      Standing Expiration Date: 08/26/2016  . Comprehensive metabolic panel    Standing Status: Future     Number of Occurrences:      Standing Expiration Date: 08/26/2016    Order Specific Question:  Has the patient fasted?    Answer:  No   All questions were answered. The patient knows to call the clinic with any problems, questions or concerns.      Cammie Sickle, MD 08/27/2015 5:54 PM

## 2015-08-27 NOTE — Assessment & Plan Note (Addendum)
Status post splenectomy;  clinically no evidence of recurrence. Continue monitor for now. CBC within normal limits.  # Prostate cancer- with recurrence in 2014 in the prostate bed status post radiation. Currently being followed by PSA by his urologist. Currently clinically asymptomatic.  # Follow-up with me in about 6 months with labs; no imaging needed.

## 2015-10-17 DIAGNOSIS — E7800 Pure hypercholesterolemia, unspecified: Secondary | ICD-10-CM | POA: Insufficient documentation

## 2016-03-03 ENCOUNTER — Inpatient Hospital Stay (HOSPITAL_BASED_OUTPATIENT_CLINIC_OR_DEPARTMENT_OTHER): Payer: Medicare Other

## 2016-03-03 ENCOUNTER — Inpatient Hospital Stay: Payer: Medicare Other | Attending: Internal Medicine | Admitting: Internal Medicine

## 2016-03-03 ENCOUNTER — Other Ambulatory Visit: Payer: Medicare Other

## 2016-03-03 ENCOUNTER — Ambulatory Visit: Payer: Medicare Other | Admitting: Internal Medicine

## 2016-03-03 VITALS — BP 149/81 | HR 79 | Temp 97.3°F | Wt 223.4 lb

## 2016-03-03 DIAGNOSIS — I10 Essential (primary) hypertension: Secondary | ICD-10-CM | POA: Insufficient documentation

## 2016-03-03 DIAGNOSIS — Z7982 Long term (current) use of aspirin: Secondary | ICD-10-CM

## 2016-03-03 DIAGNOSIS — G20 Parkinson's disease: Secondary | ICD-10-CM | POA: Diagnosis not present

## 2016-03-03 DIAGNOSIS — Z9081 Acquired absence of spleen: Secondary | ICD-10-CM | POA: Diagnosis not present

## 2016-03-03 DIAGNOSIS — C61 Malignant neoplasm of prostate: Secondary | ICD-10-CM | POA: Diagnosis present

## 2016-03-03 DIAGNOSIS — H938X1 Other specified disorders of right ear: Secondary | ICD-10-CM | POA: Insufficient documentation

## 2016-03-03 DIAGNOSIS — Z79899 Other long term (current) drug therapy: Secondary | ICD-10-CM | POA: Insufficient documentation

## 2016-03-03 DIAGNOSIS — Z923 Personal history of irradiation: Secondary | ICD-10-CM | POA: Diagnosis not present

## 2016-03-03 DIAGNOSIS — Z8572 Personal history of non-Hodgkin lymphomas: Secondary | ICD-10-CM

## 2016-03-03 DIAGNOSIS — C8307 Small cell B-cell lymphoma, spleen: Secondary | ICD-10-CM

## 2016-03-03 LAB — CBC WITH DIFFERENTIAL/PLATELET
BASOS ABS: 0.1 10*3/uL (ref 0–0.1)
BASOS PCT: 1 %
Eosinophils Absolute: 0.2 10*3/uL (ref 0–0.7)
Eosinophils Relative: 2 %
HEMATOCRIT: 48.1 % (ref 40.0–52.0)
HEMOGLOBIN: 16.4 g/dL (ref 13.0–18.0)
LYMPHS PCT: 25 %
Lymphs Abs: 2.6 10*3/uL (ref 1.0–3.6)
MCH: 31.3 pg (ref 26.0–34.0)
MCHC: 34.1 g/dL (ref 32.0–36.0)
MCV: 91.6 fL (ref 80.0–100.0)
Monocytes Absolute: 0.9 10*3/uL (ref 0.2–1.0)
Monocytes Relative: 9 %
NEUTROS ABS: 6.9 10*3/uL — AB (ref 1.4–6.5)
NEUTROS PCT: 63 %
Platelets: 254 10*3/uL (ref 150–440)
RBC: 5.25 MIL/uL (ref 4.40–5.90)
RDW: 13.3 % (ref 11.5–14.5)
WBC: 10.7 10*3/uL — ABNORMAL HIGH (ref 3.8–10.6)

## 2016-03-03 LAB — COMPREHENSIVE METABOLIC PANEL
ALBUMIN: 4.5 g/dL (ref 3.5–5.0)
ALK PHOS: 86 U/L (ref 38–126)
ALT: 5 U/L — AB (ref 17–63)
AST: 22 U/L (ref 15–41)
Anion gap: 5 (ref 5–15)
BILIRUBIN TOTAL: 0.9 mg/dL (ref 0.3–1.2)
BUN: 25 mg/dL — AB (ref 6–20)
CALCIUM: 9.4 mg/dL (ref 8.9–10.3)
CO2: 27 mmol/L (ref 22–32)
CREATININE: 1.02 mg/dL (ref 0.61–1.24)
Chloride: 107 mmol/L (ref 101–111)
GFR calc Af Amer: >60 mL/min (ref >60)
GLUCOSE: 116 mg/dL — AB (ref 65–99)
POTASSIUM: 4.5 mmol/L (ref 3.5–5.1)
Sodium: 139 mmol/L (ref 135–145)
TOTAL PROTEIN: 7 g/dL (ref 6.5–8.1)

## 2016-03-03 NOTE — Progress Notes (Signed)
Patient here today for follow up. Patient has inflammation of area on right cheek that has been getting bigger.

## 2016-03-03 NOTE — Assessment & Plan Note (Addendum)
Status post splenectomy;  clinically no evidence of recurrence. Continue monitor for now. CBC within normal limits.  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 Discussed with Dr.Bennett. He feels the next step if the mass not resolving- would be a core biopsy versus excision [risk of surgery]. However as the mass is not significantly worse we will plan to repeat a PET scan prior to next visit or sooner if the mass continues to grow  # Prostate cancer- with recurrence in 2014 in the prostate bed status post radiation. Currently being followed by PSA by his urologist. Currently clinically asymptomatic.recent PSA- 0.01.   # Patient follow-up with [place] in approximately 6 months; PET scan prior labs prior.

## 2016-03-03 NOTE — Progress Notes (Signed)
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 2.Bone marrow biopsy (October, 2012)  No diagnostic features suggestive of myeloproliferative disease.   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   Patient continues to notice swelling anterior to the right ear /parotid area. Patient states the mass is waxing and waning. He otherwise denies any unusual weight loss.  Denies any unusual fevers.   REVIEW OF SYSTEMS:  A complete 10 point review of system is done which is negative except mentioned above/history of present illness.   PAST MEDICAL HISTORY :  Past Medical History:  Diagnosis Date  . History of radiation therapy 11/03/13- 12/19/13    prostate bed 6600 cGy 33 sessions  . Hypertension   . 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 Prostate cancer [facility]) 2003   s/p radical prostatectomy  . Thrombocytopenia ([place])     PAST SURGICAL HISTORY :   Past Surgical History:  Procedure Laterality Date  . RETROPUBIC PROSTATECTOMY  03/02/2001   Gleason 7  . SPLENECTOMY  04/2012   [facility]    FAMILY HISTORY :   Family History  Problem Relation Age of Onset  . Cancer Father     prostate  . Alzheimer's disease Mother     SOCIAL HISTORY:   Social History  Substance Use Topics  . Smoking status: Never Smoker  . Smokeless tobacco: Not on file  . Alcohol use No    ALLERGIES:  is allergic to niacin and oxycontin [oxycodone hcl].  MEDICATIONS:  Current Outpatient Prescriptions  Medication Sig Dispense Refill  . acetaminophen (TYLENOL) 500 MG tablet Take 500 mg by mouth every 6 (six) hours as needed.    Marland Kitchen aspirin 81 MG chewable tablet Chew 81 mg by mouth.    . B Complex Vitamins (VITAMIN-B COMPLEX) TABS Take by mouth.    . carbidopa-levodopa (SINEMET IR) 25-100 MG tablet Take by mouth.    . Cholecalciferol (VITAMIN D3) 10000 UNITS TABS Take by mouth.    Marland Kitchen lisinopril (PRINIVIL,ZESTRIL) 10 MG tablet Take by mouth.    . Multiple Vitamin (MULTI-VITAMINS) TABS Take 1 tablet by mouth.    . omega-3 acid ethyl esters (LOVAZA) 1 g capsule Take by mouth 2 (two)  times daily.    . sildenafil (VIAGRA) 100 MG tablet Take 100 mg by mouth.     No current facility-administered medications for this visit.     PHYSICAL EXAMINATION: ECOG PERFORMANCE STATUS: 0 - Asymptomatic  BP (!) 149/81 (Patient Position: Sitting)   Pulse 79   Temp 97.3 F (36.3 C) (Tympanic)   Wt 223 lb 6 oz (101.3 kg)   BMI 32.99 kg/m   Filed Weights   03/03/16 1040  Weight: 223 lb 6 oz (101.3 kg)    GENERAL: Well-nourished well-developed; Alert, no distress and comfortable.   With his wife. EYES: no pallor or icterus OROPHARYNX: no  thrush or ulceration; good dentition  NECK: supple, 1.5 cm-2 cm mass noted anterior to the right ear LYMPH:  no palpable lymphadenopathy axillary or inguinal regions LUNGS: clear to auscultation and  No wheeze or crackles HEART/CVS: regular rate & rhythm and no murmurs; No lower extremity edema ABDOMEN:abdomen soft, non-tender and normal bowel sounds Musculoskeletal:no cyanosis of digits and no clubbing  PSYCH: alert & oriented x 3 with fluent speech NEURO: no focal motor/sensory deficits SKIN:  no rashes or significant lesions  LABORATORY DATA:  I have reviewed the data as listed    Component Value Date/Time   NA 139 03/03/2016 0915   NA 143 01/25/2014 1128   K 4.5 03/03/2016 0915   K 4.3 01/25/2014 1128   CL 107 03/03/2016 0915   CL 106 01/25/2014 1128   CO2 27 03/03/2016 0915   CO2 29 01/25/2014 1128   GLUCOSE 116 (H) 03/03/2016 0915   GLUCOSE 111 (H) 01/25/2014 1128   BUN 25 (H) 03/03/2016 0915   BUN 14 01/25/2014 1128   CREATININE 1.02 03/03/2016 0915   CREATININE 1.03 01/25/2014 1128   CALCIUM 9.4 03/03/2016 0915   CALCIUM 9.5 01/25/2014 1128   PROT 7.0 03/03/2016 0915   PROT 6.8 01/25/2014 1128   ALBUMIN 4.5 03/03/2016 0915   ALBUMIN 4.1 01/25/2014 1128   AST 22 03/03/2016 0915   AST 22 01/25/2014 1128   ALT 5 (L) 03/03/2016 0915   ALT 43 01/25/2014 1128   ALKPHOS 86 03/03/2016 0915   ALKPHOS 114 01/25/2014 1128   BILITOT 0.9 03/03/2016 0915   BILITOT 0.7 01/25/2014 1128   GFRNONAA >60 03/03/2016 0915   GFRNONAA >60 01/25/2014 1128   GFRNONAA >60 07/13/2013 0951   GFRAA >60 03/03/2016 0915   GFRAA >60 01/25/2014 1128   GFRAA >60 07/13/2013 0951    No results found for: SPEP, UPEP  Lab Results  Component Value Date   WBC 10.7 (H) 03/03/2016   NEUTROABS 6.9 (H) 03/03/2016   HGB 16.4 03/03/2016   HCT 48.1 03/03/2016   MCV 91.6 03/03/2016   PLT 254 03/03/2016      Chemistry      Component Value Date/Time   NA 139 03/03/2016 0915   NA 143  01/25/2014 1128   K 4.5 03/03/2016 0915   K 4.3 01/25/2014 1128   CL 107 03/03/2016 0915   CL 106 01/25/2014 1128   CO2 27 03/03/2016 0915   CO2 29 01/25/2014 1128   BUN 25 (H) 03/03/2016 0915   BUN 14 01/25/2014 1128   CREATININE 1.02 03/03/2016 0915   CREATININE 1.03 01/25/2014 1128      Component Value Date/Time   CALCIUM 9.4 03/03/2016 0915   CALCIUM 9.5 01/25/2014 1128   ALKPHOS 86 03/03/2016 0915   ALKPHOS 114 01/25/2014 1128   AST 22 03/03/2016 0915   AST 22  01/25/2014 1128   ALT 5 (L) 03/03/2016 0915   ALT 43 01/25/2014 1128   BILITOT 0.9 03/03/2016 0915   BILITOT 0.7 01/25/2014 1128       RADIOGRAPHIC STUDIES: I have personally reviewed the radiological images as listed and agreed with the findings in the report. No results found.   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 Continue monitor for now. CBC within normal limits.  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 Discussed with Dr.Bennett. He feels the next step if the mass not resolving- would be a core biopsy versus excision [risk of surgery]. However as the mass is not significantly worse we will plan to repeat a PET scan prior to next visit or sooner if the mass continues to grow  # Prostate cancer- with recurrence in 2014 in the prostate bed status post radiation. Currently being followed by PSA by his urologist. Currently clinically asymptomatic.recent PSA- 0.01.   # Patient follow-up with [place] in approximately 6 months; PET scan prior labs prior.   Orders Placed This Encounter  Procedures  . CBC with Differential    Standing Status:   Future    Standing Expiration Date:   03/03/2017  . Comprehensive metabolic panel    Standing Status:   Future    Standing Expiration Date:   03/03/2017   All questions were answered. The patient knows to call the clinic with any problems, questions or concerns.       Cammie Sickle, MD 03/04/2016 8:07 AM

## 2016-03-04 ENCOUNTER — Telehealth: Payer: Self-pay | Admitting: Internal Medicine

## 2016-03-04 ENCOUNTER — Other Ambulatory Visit: Payer: Self-pay | Admitting: Internal Medicine

## 2016-03-04 DIAGNOSIS — C8307 Small cell B-cell lymphoma, spleen: Secondary | ICD-10-CM

## 2016-03-04 NOTE — Telephone Encounter (Signed)
Patient returned phone call.  Care plan reviewed with patient. Explained that pet scan is needed prior to next md apt in July. He is agreeable. msg sent to sch. Team in cancer center to arrange for pet scan

## 2016-03-04 NOTE — Telephone Encounter (Signed)
Discussed with Dr. Richardson Landry; plan is to get a PET scan in approximately 6 months/prior to next visit. Please inform patient of the plan. Dr.B

## 2016-03-04 NOTE — Telephone Encounter (Signed)
Pt notified of PET to be ordered by Elsie Lincoln send scheduling message to schedule PET scan

## 2016-03-04 NOTE — Telephone Encounter (Signed)
Left vm to return our call 

## 2016-03-19 ENCOUNTER — Other Ambulatory Visit: Payer: Medicare Other

## 2016-03-19 ENCOUNTER — Ambulatory Visit: Payer: Medicare Other | Admitting: Internal Medicine

## 2016-03-24 DIAGNOSIS — N200 Calculus of kidney: Secondary | ICD-10-CM | POA: Insufficient documentation

## 2016-05-09 ENCOUNTER — Other Ambulatory Visit: Payer: Self-pay | Admitting: Neurology

## 2016-05-09 DIAGNOSIS — M5416 Radiculopathy, lumbar region: Secondary | ICD-10-CM

## 2016-05-19 ENCOUNTER — Ambulatory Visit: Payer: Medicare Other | Admitting: Physical Therapy

## 2016-05-21 ENCOUNTER — Ambulatory Visit
Admission: RE | Admit: 2016-05-21 | Discharge: 2016-05-21 | Disposition: A | Discharge status: Home / Self Care | Payer: Medicare Other | Source: Ambulatory Visit | Attending: Neurology | Admitting: Neurology

## 2016-05-21 DIAGNOSIS — M5416 Radiculopathy, lumbar region: Secondary | ICD-10-CM

## 2016-05-21 DIAGNOSIS — M5137 Other intervertebral disc degeneration, lumbosacral region: Secondary | ICD-10-CM | POA: Diagnosis not present

## 2016-05-21 DIAGNOSIS — M47816 Spondylosis without myelopathy or radiculopathy, lumbar region: Secondary | ICD-10-CM | POA: Diagnosis not present

## 2016-05-22 ENCOUNTER — Encounter: Payer: Medicare Other | Admitting: Physical Therapy

## 2016-05-26 ENCOUNTER — Encounter: Payer: Self-pay | Admitting: Physical Therapy

## 2016-05-26 ENCOUNTER — Ambulatory Visit: Payer: Medicare Other | Attending: Neurology | Admitting: Physical Therapy

## 2016-05-26 ENCOUNTER — Encounter: Payer: Medicare Other | Admitting: Physical Therapy

## 2016-05-26 DIAGNOSIS — M25552 Pain in left hip: Secondary | ICD-10-CM | POA: Diagnosis not present

## 2016-05-26 NOTE — Therapy (Signed)
[place] MAIN [facility] SERVICES [address] [place], [place], [address] Phone: [phone]   Fax:  [phone]  Physical Therapy Evaluation  Patient Details  Name: Chad Sandoval MRN: [id number] Date of Birth: 30-Jun-1945 Referring Provider: Dr. Manuella Ghazi  Encounter Date: 05/26/2016      PT End of Session - 05/26/16 1733    Visit Number 1   Number of Visits 9   Date for PT Re-Evaluation 06/23/16   Authorization Type gcode 1   Authorization Time Period 10   PT Start Time 1645   PT Stop Time 1730   PT Time Calculation (min) 45 min   Activity Tolerance Patient tolerated treatment well;No increased pain   Behavior During Therapy WFL for tasks assessed/performed      Past Medical History:  Diagnosis Date  . History of radiation therapy 11/03/13- 12/19/13   prostate bed 6600 cGy 33 sessions  . 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 Prostate cancer [facility]) 2003   s/p radical prostatectomy  . Thrombocytopenia ([place])     Past Surgical History:  Procedure Laterality Date  . RETROPUBIC PROSTATECTOMY  03/02/2001   Gleason 7  . 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       Subjective Assessment - 05/26/16 1646    Subjective 71 yo Male reports increased difficulty lifting LLE when crossing leg; He went to see Dr. Manuella Ghazi (Neurologist), Nerve Conduction test was okay, MRI shows nerve impingement; He denies any back pain; He does have a history of back pain with radiculopathy back in 2005 which resolved with steroid injection; He reports that his symptoms now are different now; He denies any numbness down LLE but does have neuropathy in bottom of feet; denies any recent falls; Patient does stay active, riding stationary bike 3x a week, walks dog regularly, plays golf etc.    Pertinent History personal factors affecting rehab: Parkinson's Disease, Hx of  prostate CA with bladder issues; HTN (controlled); very active;    How long can you stand comfortably? 10+ min with pain in posterior hip (R>L)   How long can you walk comfortably? 1 mile (15-20 min)   Diagnostic tests MRI shows lumbar spondylosis  causing prominent impingement at L4-5 and L5-S1 and moderate impingement at L3-L4;    Patient Stated Goals be able to cross leg, relieve pain;    Currently in Pain? Yes   Pain Score 5    Pain Location Groin   Pain Orientation Left   Pain Descriptors / Indicators Tightness;Heaviness  grabbing   Pain Type Chronic pain   Pain Radiating Towards just in groin   Pain Onset More than a month ago   Pain Frequency Intermittent   Aggravating Factors  trying to cross leg; sitting in low seat;    Pain Relieving Factors rest;    Effect of Pain on Daily Activities no big change;    Multiple Pain Sites No            [facility] PT Assessment - 05/26/16 0001      Assessment   Medical Diagnosis LLE weakness   Referring Provider Dr. Manuella Ghazi   Onset Date/Surgical Date 11/25/14   Hand Dominance Right   Next MD Visit not sure   Prior Therapy Had [facility] for Parkinsons in 2016; denies any recent therapy and no therapy for this  condition;      Precautions   Precautions None     Restrictions   Weight Bearing Restrictions No     Balance Screen   Has the patient fallen in the past 6 months No   Has the patient had a decrease in activity level because of a fear of falling?  No   Is the patient reluctant to leave their home because of a fear of falling?  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      Coordination   Gross Motor Movements are Fluid and Coordinated Yes   Fine Motor Movements are Fluid and Coordinated Yes     Posture/Postural Control   Posture Comments demonstrates erect posture, no abnormality noted;      AROM   Overall AROM Comments BUE/BLE AROM is WFL; lumbar ROM is [facility]     Strength   Overall Strength Comments BLE gross strength is WFL; does have some groin pain with resisted left knee flexion      Palpation   Palpation comment denies any tenderness to palpation;      FABER test   findings Positive   Side LEft     Slump test   Findings Positive   Side Left   Comment pain in groin     Prone Knee Bend Test   Findings Negative   Side Left     Straight Leg Raise   Findings Negative   Side  Left     Transfers   Comments able to transfer sit<>Stand without pushing on chair; no pain;      Ambulation/Gait   Gait Comments patient walks independently; no difficulty;      Standardized Balance Assessment   Five times sit to stand comments  9.5 sec (low fall risk)        TREATMENT: PT performed moderate long axis distraction of left LE in supine 20 sec hold, 5 sec rest x3-4 min; Patient reports less pain following distraction;                    PT Education - 05/26/16 1730    Education provided Yes   Education Details recommendations, plan of care;    Person(s) Educated Patient   Methods Explanation;Verbal cues   Comprehension Verbalized understanding;Returned demonstration;Verbal cues required             PT Long Term Goals - 05/26/16 1736      PT LONG TERM GOAL #1   Title Patient will be independent in home exercise program to improve strength/mobility for better  functional independence with ADLs.   Time 4   Period Weeks   Status New     PT LONG TERM GOAL #2   Title Patient will report a worst pain of 3/10 on VAS in  left hip/groin   to improve tolerance with ADLs and reduced symptoms with activities.    Time 4   Period Weeks   Status New     PT LONG TERM GOAL #3   Title Patient will be able to cross left foot over right knee without pain consistently to improve ability to don/doff socks/shoes.    Time 4   Period Weeks   Status New               Plan - 05/26/16 1733    Clinical Impression Statement 71 yo Male reports increased left groin pain when trying to cross leg. He demonstrates full strength in BLE. He denies any numbness/tingling in LE other than plantar surface of foot consistent with neuropathy. Patient did test positive for possible lumbar impingement as well as hip joint OA with pain with hip flexion, adduction and IR. He would benefit from skilled PT intervention to improve hip mobility and reduce pain with ADLs;    Rehab Potential Good   Clinical Impairments Affecting Rehab Potential positive: good PLOF, active lifestyle, good caregiver support; Negative: co-morbidities, chronic pain; Patient's clinical presentation is stable as his pain is localized to left groin and has not worsened;    PT Frequency 2x / week   PT Duration 4 weeks   PT Treatment/Interventions Aquatic Therapy;Cryotherapy;Electrical Stimulation;Ultrasound;Traction;Moist Heat;Functional mobility training;Therapeutic activities;Therapeutic exercise;Patient/family education;Neuromuscular re-education;Balance training;Manual techniques;Energy conservation;Dry needling;Passive range of motion   PT Next Visit Plan joint distraction, hip strengthening;    PT Home Exercise Plan educated patient in LE stretches;    Consulted and Agree with Plan of Care Patient      Patient will benefit from skilled therapeutic intervention in order to improve the following deficits  and impairments:  Pain, Decreased mobility, Decreased activity tolerance, Decreased range of motion, Hypomobility, Impaired flexibility  Visit Diagnosis: Pain in left hip - Plan: PT plan of care cert/re-cert      G-Codes - 32/44/01 1737    Functional Assessment Tool Used (Outpatient Only) LEFs, 5 times sit<>Stand, clinical judgement;    Functional Limitation Mobility: Walking and moving around   Mobility: Walking and Moving Around Current Status [phone]) At least 20 percent but less than 40 percent impaired, limited or restricted   Mobility: Walking and Moving Around Goal Status [phone]) At least 1 percent but less than 20 percent impaired, limited or restricted       Problem List Patient Active Problem List   Diagnosis Date Noted  . Essential (primary) hypertension 01/29/2015  . Class 1 obesity 01/29/2015  . H/O malignant neoplasm of prostate 12/15/2014  . 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 Diabetes mellitus, type 2 ([place]) 02/07/2014  . Type 2 diabetes mellitus ([place]) 02/07/2014  . Bladder neck obstruction 01/02/2014  . Bladder retention 01/02/2014  . Abnormal prostate specific antigen 09/29/2011  . Elevated prostate specific antigen (PSA) 09/29/2011  . CA of prostate ([place]) 04/07/2011  . 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 Malignant neoplasm of prostate ([place]) 04/07/2011    Trotter,Margaret PT, DPT 05/26/2016, 5:39 PM  [place] MAIN [facility] SERVICES [address] [place], [place], [address] Phone: [phone]   Fax:  [phone]  Name: Chad Sandoval MRN: [id number] Date of Birth: Jul 16, 1945

## 2016-05-28 ENCOUNTER — Encounter: Payer: Medicare Other | Admitting: Physical Therapy

## 2016-05-28 ENCOUNTER — Ambulatory Visit: Payer: Medicare Other | Admitting: Physical Therapy

## 2016-06-02 ENCOUNTER — Encounter: Payer: Medicare Other | Admitting: Physical Therapy

## 2016-06-03 ENCOUNTER — Ambulatory Visit: Payer: Medicare Other | Admitting: Physical Therapy

## 2016-06-04 ENCOUNTER — Encounter: Payer: Medicare Other | Admitting: Physical Therapy

## 2016-06-09 ENCOUNTER — Ambulatory Visit: Payer: Medicare Other | Admitting: Physical Therapy

## 2016-06-09 ENCOUNTER — Encounter: Payer: Medicare Other | Admitting: Physical Therapy

## 2016-06-11 ENCOUNTER — Ambulatory Visit: Payer: Medicare Other | Admitting: Physical Therapy

## 2016-06-11 ENCOUNTER — Encounter: Payer: Medicare Other | Admitting: Physical Therapy

## 2016-06-17 ENCOUNTER — Ambulatory Visit: Payer: Medicare Other | Admitting: Physical Therapy

## 2016-06-23 ENCOUNTER — Ambulatory Visit: Payer: Medicare Other | Admitting: Physical Therapy

## 2016-06-25 ENCOUNTER — Ambulatory Visit: Payer: Medicare Other | Admitting: Physical Therapy

## 2016-06-30 ENCOUNTER — Ambulatory Visit: Payer: Medicare Other | Admitting: Physical Therapy

## 2016-07-02 ENCOUNTER — Ambulatory Visit: Payer: Medicare Other | Admitting: Physical Therapy

## 2016-07-07 ENCOUNTER — Encounter: Payer: Self-pay | Admitting: Physical Therapy

## 2016-07-07 ENCOUNTER — Ambulatory Visit: Payer: Medicare Other | Attending: Neurology | Admitting: Physical Therapy

## 2016-07-07 DIAGNOSIS — M6281 Muscle weakness (generalized): Secondary | ICD-10-CM | POA: Insufficient documentation

## 2016-07-07 DIAGNOSIS — M25551 Pain in right hip: Secondary | ICD-10-CM | POA: Insufficient documentation

## 2016-07-07 NOTE — Patient Instructions (Addendum)
Breathing    Sit or stand with body upright and naturally aligned. Focus on movement of body with breathing. Notice expansion with inhale, release with exhale. Repeat for _10__ breaths.  Copyright  VHI. All rights reserved.  Pelvic Anterior / Posterior Tilt / Pelvic Rock    Stand with upper back and buttocks touching wall,  A.Anterior tilt: Raise chest and arch back slightly. B.Posterior tilt: Contract abdominals and flatten back. Rock __10__ times per session. Do __2__ sessions per day; Progression: Perform pelvic tilt away from wall.  Copyright  VHI. All rights reserved.  Walking Kickboard Push Down With Lunge    Sitting or standing, have arms out in front, breath in deep, then exhale and push hands down on table/counter, Hold for 2 sec;  Repeat 10 times, 2x a day;

## 2016-07-08 NOTE — Therapy (Addendum)
[place] MAIN [facility] SERVICES [address] [place], [place], [address] Phone: [phone]   Fax:  [phone]  Physical Therapy Re-Evaluation  Patient Details  Name: Chad Sandoval MRN: [id number] Date of Birth: [birth year] Referring Provider: Dr. Manuella Ghazi  Encounter Date: 07/07/2016      PT End of Session - 07/08/16 0904    Visit Number 2   Number of Visits 10   Date for PT Re-Evaluation 08/05/16   Authorization Type gcode 2   Authorization Time Period 10   PT Start Time 1632   PT Stop Time 7616   PT Time Calculation (min) 43 min   Activity Tolerance Patient tolerated treatment well;No increased pain   Behavior During Therapy WFL for tasks assessed/performed      Past Medical History:  Diagnosis Date  . History of radiation therapy 11/03/13- 12/19/13   prostate bed 6600 cGy 33 sessions  . 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 Prostate cancer [facility]) 2003   s/p radical prostatectomy  . Thrombocytopenia ([place])     Past Surgical History:  Procedure Laterality Date  . RETROPUBIC PROSTATECTOMY  03/02/2001   Gleason 7  . 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       Subjective Assessment - 07/09/16 1537    Subjective Patient reports still having RLE discomfort with prolonged standing; He will also have a little twinge when he gets up from sitting for a while. He denies any back pain but does report that MRI shows nerve impingement; He reports that it is easier for him to cross his leg over his knee than it was before.    Pertinent History personal factors affecting rehab: Parkinson's Disease, Hx of prostate CA with bladder issues; HTN (controlled); very active;    How long can you stand comfortably? 10+ min with pain in posterior hip (R>L)   How long can you walk comfortably? 1 mile (15-20 min)   Diagnostic tests MRI shows lumbar  spondylosis  causing prominent impingement at L4-5 and L5-S1 and moderate impingement at L3-L4;    Patient Stated Goals be able to cross leg, relieve pain;    Currently in Pain? Yes   Pain Score 1    Pain Location Hip   Pain Orientation Right   Pain Descriptors / Indicators Tightness   Pain Type Chronic pain   Pain Radiating Towards hip   Pain Onset More than a month ago   Pain Frequency Intermittent   Aggravating Factors  standing long time, transfer from low seat   Pain Relieving Factors rest   Effect of Pain on Daily Activities no big change   Multiple Pain Sites No            [facility] PT Assessment - 07/09/16 0001      AROM   Overall AROM Comments Lumbar AROM is WFL; he denies any pain with all movement;      Palpation   Spinal mobility Denies any pain with PA mobs; Patient does exhibit some stiffness with upper lumbar lower thoracic PA mobs but this does not increase his pain;    Palpation comment Patient denies any tenderness to palpation of bilateral piriformis; he does have tightness in lumbar paraspinals;      Slump test   Findings Negative   Side --  bilaterally;     Prone  Knee Bend Test   Findings Negative   Side --  bilaterally;         TREATMENT:  PT instructed patient in core stabilization exercise:  Seated: Diaphragmatic breathing with cues for reducing chest breathing and isolate diaphragmatic breathing for better core activation x10 breaths;  BUE push down on table at shoulder height, with exhale for better core activation x5 reps; Patient required min Vcs for positioning to improve core activation and to reduce excessive push down to reduce risk for shoulder pain;  Standing at wall: Anterior/posterior pelvic rocks x10 reps with cues for lower abdominal activation for better stretch in lumbar spine; Patient denies any pain with exercise; Provided written handout for compliance;                   PT Education - 07/08/16 0904     Education provided Yes   Education Details HEP advanced, recommendations;    Person(s) Educated Patient   Methods Explanation;Verbal cues;Handout   Comprehension Verbalized understanding;Returned demonstration;Verbal cues required             PT Long Term Goals - 07/08/16 0907      PT LONG TERM GOAL #1   Title Patient will be independent in home exercise program to improve strength/mobility for better functional independence with ADLs.   Time 4   Period Weeks   Status New     PT LONG TERM GOAL #2   Title Patient will report a worst pain of 3/10 on VAS in  right hip after standing for 30 minutes   to improve tolerance with ADLs and reduced symptoms with activities.    Time 4   Period Weeks   Status New     PT LONG TERM GOAL #3   Title Patient verbalize and/or demonstrate correct lifting technique/body mechanics with ADLs to reduce risk for re-injury to back;    Time 4   Period Weeks   Status New               Plan - 07/08/16 0904    Clinical Impression Statement Patient reports less weakness and tightness in LLE being able to cross leg without difficulty for dressing and ADLs. He does report continued LE radicular symptoms with prolonged standing (>15 min) which starts in right hip and will eventually go down to foot if he continues standing. Patient denies any back pain. He does exhibit some stiffness in lumbar spine and tightness in paraspinals. He denies any tenderness to palpation. He would benefit from skilled PT intervention to improve core strengthening and flexibility exercise to improve spinal mobility and reduce pain with standing ADLs.    Rehab Potential Good   Clinical Impairments Affecting Rehab Potential positive: good PLOF, active lifestyle, good caregiver support; Negative: co-morbidities, chronic pain; Patient's clinical presentation is stable as his pain is localized to right hip and down LE and is controlled with positioning;    PT Frequency 2x / week    PT Duration 4 weeks   PT Treatment/Interventions Aquatic Therapy;Cryotherapy;Electrical Stimulation;Ultrasound;Traction;Moist Heat;Functional mobility training;Therapeutic activities;Therapeutic exercise;Patient/family education;Neuromuscular re-education;Balance training;Manual techniques;Energy conservation;Dry needling;Passive range of motion   PT Next Visit Plan Spinal mobility, core strengthening   PT Home Exercise Plan initiated- see patient instructions;    Consulted and Agree with Plan of Care Patient      Patient will benefit from skilled therapeutic intervention in order to improve the following deficits and impairments:  Pain, Decreased mobility, Decreased activity tolerance, Decreased range of motion, Hypomobility,  Impaired flexibility  Visit Diagnosis: Pain in right hip - Plan: PT plan of care cert/re-cert      G-Codes - 16/60/60 1536    Functional Assessment Tool Used (Outpatient Only) clinical judgement, joint mobility, pain level, transfer/gait ability;    Functional Limitation Mobility: Walking and moving around   Mobility: Walking and Moving Around Current Status [phone]) At least 20 percent but less than 40 percent impaired, limited or restricted   Mobility: Walking and Moving Around Goal Status [phone]) At least 1 percent but less than 20 percent impaired, limited or restricted       Problem List Patient Active Problem List   Diagnosis Date Noted  . Essential (primary) hypertension 01/29/2015  . Class 1 obesity 01/29/2015  . H/O malignant neoplasm of prostate 12/15/2014  . 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 Diabetes mellitus, type 2 ([place]) 02/07/2014  . Type 2 diabetes mellitus ([place]) 02/07/2014  . Bladder neck obstruction 01/02/2014  . Bladder retention 01/02/2014  . Abnormal prostate specific antigen 09/29/2011  . Elevated prostate specific antigen (PSA) 09/29/2011  . CA of prostate ([place]) 04/07/2011  . ED (erectile dysfunction) of organic  origin 04/07/2011  . Malignant neoplasm of prostate ([place]) 04/07/2011    Trotter,Margaret PT,DPT 07/09/2016, 3:38 PM  [place] MAIN [facility] SERVICES [address] [place], [place], [address] Phone: [phone]   Fax:  [phone]  Name: Chad Sandoval MRN: [id number] Date of Birth: [birth year]

## 2016-07-09 ENCOUNTER — Encounter: Payer: Self-pay | Admitting: Physical Therapy

## 2016-07-09 ENCOUNTER — Ambulatory Visit: Payer: Medicare Other | Admitting: Physical Therapy

## 2016-07-09 DIAGNOSIS — M25551 Pain in right hip: Secondary | ICD-10-CM

## 2016-07-09 NOTE — Therapy (Signed)
[place] MAIN [facility] SERVICES [address] [place], [place], [address] Phone: [phone]   Fax:  [phone]  Physical Therapy Treatment  Patient Details  Name: ABDULKADIR Sandoval MRN: [id number] Date of Birth: Sep 25, 1945 Referring Provider: Dr. Manuella Ghazi  Encounter Date: 07/09/2016      PT End of Session - 07/09/16 1639    Visit Number 3   Number of Visits 10   Date for PT Re-Evaluation 08/05/16   Authorization Type gcode 2   Authorization Time Period 10   PT Start Time 1632   PT Stop Time 7867   PT Time Calculation (min) 43 min   Activity Tolerance Patient tolerated treatment well;No increased pain   Behavior During Therapy WFL for tasks assessed/performed      Past Medical History:  Diagnosis Date  . History of radiation therapy 11/03/13- 12/19/13   prostate bed 6600 cGy 33 sessions  . 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 Prostate cancer [facility]) 2003   s/p radical prostatectomy  . Thrombocytopenia ([place])     Past Surgical History:  Procedure Laterality Date  . RETROPUBIC PROSTATECTOMY  03/02/2001   Gleason 7  . 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      Subjective Assessment - 07/09/16 1637    Subjective Patient reports doing well; He reports doing his exercises with some pain relief. He is having some posterior right hip pain to about his knee. Denies any increase in pain;    Pertinent History personal factors affecting rehab: Parkinson's Disease, Hx of prostate CA with bladder issues; HTN (controlled); very active;    How long can you stand comfortably? 10+ min with pain in posterior hip (R>L)   How long can you walk comfortably? 1 mile (15-20 min)   Diagnostic tests MRI shows lumbar spondylosis  causing prominent impingement at L4-5 and L5-S1 and moderate impingement at L3-L4;    Patient Stated Goals be able to cross leg,  relieve pain;    Currently in Pain? Yes   Pain Score 3    Pain Location Hip   Pain Orientation Right   Pain Descriptors / Indicators Tightness   Pain Type Chronic pain   Pain Onset More than a month ago   Pain Frequency Intermittent   Aggravating Factors  standing long time, transfers from car   Pain Relieving Factors rest   Effect of Pain on Daily Activities no big change   Multiple Pain Sites No          TREATMENT: Hooklying: Lumbar trunk rotation to left side and back to midline x2 min with cues to avoid going to right side for less discomfort and to slow down LE movement for better stretch;  Single knee to chest stretch 15 sec hold x2 bilaterally; Piriformis stretch 10 sec hold x2 bilaterally with cues to avoid straining neck and to relax into stretch for better hip flexibility;  Diaphragmatic breathing with posterior pelvic tilt with cues for positioning and to activate lower abdominal muscles with each exhalation x10 breaths; Patient also required min Vcs to avoid hyperventilating;   Hooklying with pball under LEs: Double knee to chest AROM x5 reps; Lumbar trunk rotation to left and back to midline x10 reps to facilitate right sided low back stretch to reduce RLE pain;  Pball bridges x10 with arms across chest and min  Vcs to avoid trunk rotation but to slow down LE movement for better hip strengthening;   Patient reports no pain in RLE hip following exercise; Had patient stand; Reviewed LSVT parkinson's exercise. Patient reports increased pain when doing LLE extension or right trunk rotation exercise.He reports that the pain is not severe and doesn't keep him from the exercise but it is present.   While standing for about 10 min, patient reports increased RLE posterior hip pain to 1/10;  PT applied Lumbar mechanical traction in hooklying position, 80# pull, 50# rest (20 sec on, 10 sec off) x10 min; Patient instructed to relax during traction for better tolerance with  lumbosacral pull; He reports no pain while on traction machine; He reports no pain at end of treatment session; Tolerated well;                     PT Education - 07/09/16 1638    Education provided Yes   Education Details HEP reinforced, core stabilizations, pelvic movement exercise;    Person(s) Educated Patient   Methods Explanation;Demonstration;Verbal cues   Comprehension Verbalized understanding;Returned demonstration;Verbal cues required;Need further instruction             PT Long Term Goals - 07/08/16 0907      PT LONG TERM GOAL #1   Title Patient will be independent in home exercise program to improve strength/mobility for better functional independence with ADLs.   Time 4   Period Weeks   Status New     PT LONG TERM GOAL #2   Title Patient will report a worst pain of 3/10 on VAS in  right hip after standing for 30 minutes   to improve tolerance with ADLs and reduced symptoms with activities.    Time 4   Period Weeks   Status New     PT LONG TERM GOAL #3   Title Patient verbalize and/or demonstrate correct lifting technique/body mechanics with ADLs to reduce risk for re-injury to back;    Time 4   Period Weeks   Status New               Plan - 07/09/16 1711    Clinical Impression Statement Patient instructed in advanced core stabilization exercise. While he is in hooklying position and doing exercise, he denies any increase in pain and actually reports no pain in RLE hip; However after standing for about 10 min he starts to feel right posterior hip pain; PT applied lumbar mechanical traction to reduce discomfort down RLE; Patient tolerated well without pain at end of treatment session; He would benefit from additional skilled PT intervention to improve core abdominal strength and reduce pain with ADLs;    Rehab Potential Good   Clinical Impairments Affecting Rehab Potential positive: good PLOF, active lifestyle, good caregiver support;  Negative: co-morbidities, chronic pain; Patient's clinical presentation is stable as his pain is localized to right hip and down LE and is controlled with positioning;    PT Frequency 2x / week   PT Duration 4 weeks   PT Treatment/Interventions Aquatic Therapy;Cryotherapy;Electrical Stimulation;Ultrasound;Traction;Moist Heat;Functional mobility training;Therapeutic activities;Therapeutic exercise;Patient/family education;Neuromuscular re-education;Balance training;Manual techniques;Energy conservation;Dry needling;Passive range of motion   PT Next Visit Plan Spinal mobility, core strengthening   PT Home Exercise Plan continue as given;    Consulted and Agree with Plan of Care Patient      Patient will benefit from skilled therapeutic intervention in order to improve the following deficits and impairments:  Pain, Decreased mobility,  Decreased activity tolerance, Decreased range of motion, Hypomobility, Impaired flexibility  Visit Diagnosis: Pain in right hip       G-Codes - 08/04/16 1536    Functional Assessment Tool Used (Outpatient Only) clinical judgement, joint mobility, pain level, transfer/gait ability;    Functional Limitation Mobility: Walking and moving around   Mobility: Walking and Moving Around Current Status [phone]) At least 20 percent but less than 40 percent impaired, limited or restricted   Mobility: Walking and Moving Around Goal Status [phone]) At least 1 percent but less than 20 percent impaired, limited or restricted      Problem List Patient Active Problem List   Diagnosis Date Noted  . Essential (primary) hypertension 01/29/2015  . Class 1 obesity 01/29/2015  . H/O malignant neoplasm of prostate 12/15/2014  . 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 Diabetes mellitus, type 2 ([place]) 02/07/2014  . Type 2 diabetes mellitus ([place]) 02/07/2014  . Bladder neck obstruction 01/02/2014  . Bladder retention 01/02/2014  . Abnormal prostate specific antigen  09/29/2011  . Elevated prostate specific antigen (PSA) 09/29/2011  . CA of prostate ([place]) 04/07/2011  . 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 Malignant neoplasm of prostate ([place]) 04/07/2011    Trotter,Margaret PT, DPT Aug 04, 2016, 5:15 PM  [place] MAIN [facility] SERVICES [address] [place], [place], [address] Phone: [phone]   Fax:  [phone]  Name: Chad Sandoval MRN: [id number] Date of Birth: 12/31/1945

## 2016-07-14 ENCOUNTER — Ambulatory Visit: Payer: Medicare Other

## 2016-07-14 VITALS — BP 127/75 | HR 87

## 2016-07-14 DIAGNOSIS — M6281 Muscle weakness (generalized): Secondary | ICD-10-CM

## 2016-07-14 DIAGNOSIS — M25551 Pain in right hip: Secondary | ICD-10-CM | POA: Diagnosis not present

## 2016-07-14 NOTE — Therapy (Signed)
[place] MAIN [facility] SERVICES [address] [place], [place], [address] Phone: [phone]   Fax:  [phone]  Physical Therapy Treatment  Patient Details  Name: Chad Sandoval MRN: [id number] Date of Birth: 04-19-1945 Referring Provider: Dr. Manuella Ghazi  Encounter Date: 07/14/2016      PT End of Session - 07/14/16 1532    Visit Number 4   Number of Visits 10   Date for PT Re-Evaluation 08/05/16   Authorization Type gcode 4   Authorization Time Period 10   PT Start Time 1635   PT Stop Time 1715   PT Time Calculation (min) 40 min   Activity Tolerance Patient tolerated treatment well   Behavior During Therapy [facility] for tasks assessed/performed      Past Medical History:  Diagnosis Date  . History of radiation therapy 11/03/13- 12/19/13   prostate bed 6600 cGy 33 sessions  . 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 Prostate cancer [facility]) 2003   s/p radical prostatectomy  . Thrombocytopenia ([place])     Past Surgical History:  Procedure Laterality Date  . RETROPUBIC PROSTATECTOMY  03/02/2001   Gleason 7  . 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 He reports that the mechanical traction provided some relief during the last session but has not noticed long term improvement. No specific questions or concerns currently.    Pertinent History personal factors affecting rehab: Parkinson's Disease, Hx of prostate CA with bladder issues; HTN (controlled); very active;    How long can you stand comfortably? 10+ min with pain in posterior hip (R>L)   How long can you walk comfortably? 1 mile (15-20 min)   Diagnostic tests MRI shows lumbar spondylosis  causing prominent impingement at L4-5 and L5-S1 and moderate  impingement at L3-L4;    Patient Stated Goals be able to cross leg, relieve pain;    Pain Score 6    Pain Location Hip   Pain Orientation Right   Pain Descriptors / Indicators Tightness   Pain Type Chronic pain   Pain Onset More than a month ago   Pain Frequency Intermittent         TREATMENT:  Manual Therapy R single knee to chest stretch 30s hold x 3; R piriformis FADIR stretch 30s hold x 3; R hip inferior mobilizations with belt assist, hip flexed to 90, grade III, 30s/bout x 3 bouts; R hip inferior distraction with hip in neutral and belt assist 30s x 3; STM to R piriformis with positive reproduction of pain in posterior R hip and some mild radiation of pain into R posterior thigh; SLR negative;  Ther-ex Posterior pelvic tilt with TrA contraction and alternating leg lefts x 10 bilateral;  Hooklying with pball under LEs: Double knee to chest AROM 2 x 10 reps; Lumbar trunk rotation to left and back to midline x 10 reps to facilitate right sided low back stretch to reduce RLE pain;  Pball bridges with arms at side 2 x 10; Isometric resisted lumbar rotation with manual resistance 5s hold x 10 bilateral;  BUE push down on table at chest height (pt in sitting), with exhale for better core activation  5s hold x 10;                       PT Education - 07/14/16 1532    Education provided Yes   Education Details HEP reinforced   Person(s) Educated Patient   Methods Explanation   Comprehension Verbalized understanding             PT Long Term Goals - 07/08/16 0907      PT LONG TERM GOAL #1   Title Patient will be independent in home exercise program to improve strength/mobility for better functional independence with ADLs.   Time 4   Period Weeks   Status New     PT LONG TERM GOAL #2   Title Patient will report a worst pain of 3/10 on VAS in  right hip after standing for 30 minutes   to improve tolerance with ADLs and reduced symptoms with  activities.    Time 4   Period Weeks   Status New     PT LONG TERM GOAL #3   Title Patient verbalize and/or demonstrate correct lifting technique/body mechanics with ADLs to reduce risk for re-injury to back;    Time 4   Period Weeks   Status New               Plan - 07/14/16 1532    Clinical Impression Statement Pt reports no pain at the end of therapy session today. He reports pain with palpation of R piriformis with positive reproduction of pain. Also reports positive reproduction of pain with FADIR stretch in supine position. Pt denies back pain. He does also report history of similar radicular pain years ago which improved with epidural steroid injections in his lumbar spine. Pt encouraged to continue HEP and follow-up as session. Will consider lumbar traction again at next session pending his response to today's interventions.    Rehab Potential Good   Clinical Impairments Affecting Rehab Potential positive: good PLOF, active lifestyle, good caregiver support; Negative: co-morbidities, chronic pain; Patient's clinical presentation is stable as his pain is localized to right hip and down LE and is controlled with positioning;    PT Frequency 2x / week   PT Duration 4 weeks   PT Treatment/Interventions Aquatic Therapy;Cryotherapy;Electrical Stimulation;Ultrasound;Traction;Moist Heat;Functional mobility training;Therapeutic activities;Therapeutic exercise;Patient/family education;Neuromuscular re-education;Balance training;Manual techniques;Energy conservation;Dry needling;Passive range of motion   PT Next Visit Plan Spinal mobility, core strengthening, consider lumbar traction again if necessary   PT Home Exercise Plan continue as given;    Consulted and Agree with Plan of Care Patient      Patient will benefit from skilled therapeutic intervention in order to improve the following deficits and impairments:  Pain, Decreased mobility, Decreased activity tolerance, Decreased range  of motion, Hypomobility, Impaired flexibility  Visit Diagnosis: Pain in right hip  Muscle weakness (generalized)     Problem List Patient Active Problem List   Diagnosis Date Noted  . Essential (primary) hypertension 01/29/2015  . Class 1 obesity 01/29/2015  . H/O malignant neoplasm of prostate 12/15/2014  . 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 Diabetes mellitus, type 2 ([place]) 02/07/2014  . Type 2 diabetes mellitus ([place]) 02/07/2014  . Bladder neck obstruction 01/02/2014  . Bladder retention 01/02/2014  . Abnormal prostate specific antigen 09/29/2011  . Elevated prostate specific antigen (PSA) 09/29/2011  . CA of prostate ([place]) 04/07/2011  . 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 Malignant neoplasm of prostate ([place]) 04/07/2011  Phillips Grout PT, DPT   Elma Shands 07/14/2016, 4:29 PM  [place] MAIN [facility] SERVICES [address] [place], [place], [address] Phone: [phone]   Fax:  [phone]  Name: KALIJAH ZEISS MRN: [id number] Date of Birth: 08-26-1945

## 2016-07-16 ENCOUNTER — Ambulatory Visit: Payer: Medicare Other

## 2016-07-16 DIAGNOSIS — M25551 Pain in right hip: Secondary | ICD-10-CM

## 2016-07-16 DIAGNOSIS — M6281 Muscle weakness (generalized): Secondary | ICD-10-CM

## 2016-07-16 NOTE — Therapy (Signed)
[place] MAIN [facility] SERVICES [address] [place], [place], [address] Phone: [phone]   Fax:  [phone]  Physical Therapy Treatment  Patient Details  Name: Chad Sandoval MRN: [id number] Date of Birth: 1945-05-29 Referring Provider: Dr. Manuella Ghazi  Encounter Date: 07/16/2016      PT End of Session - 07/16/16 1529    Visit Number 5   Number of Visits 10   Date for PT Re-Evaluation 08/05/16   Authorization Type gcode 5   Authorization Time Period 10   PT Start Time 1530   PT Stop Time 1615   PT Time Calculation (min) 45 min   Activity Tolerance Patient tolerated treatment well   Behavior During Therapy [facility] for tasks assessed/performed      Past Medical History:  Diagnosis Date  . History of radiation therapy 11/03/13- 12/19/13   prostate bed 6600 cGy 33 sessions  . 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 Prostate cancer [facility]) 2003   s/p radical prostatectomy  . Thrombocytopenia ([place])     Past Surgical History:  Procedure Laterality Date  . RETROPUBIC PROSTATECTOMY  03/02/2001   Gleason 7  . 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      Subjective Assessment - 07/16/16 1528    Subjective Pt reports he is doing well today. He has 2/10 hip pain upon arrival today. No specific questions or concerns currently.    Pertinent History personal factors affecting rehab: Parkinson's Disease, Hx of prostate CA with bladder issues; HTN (controlled); very active;    How long can you stand comfortably? 10+ min with pain in posterior hip (R>L)   How long can you walk comfortably? 1 mile (15-20 min)   Diagnostic tests MRI shows lumbar spondylosis  causing prominent impingement at L4-5 and L5-S1 and moderate impingement at L3-L4;    Patient Stated Goals be able to cross leg, relieve pain;    Currently in Pain? Yes   Pain Score 2    Pain  Location Hip   Pain Orientation Right   Pain Descriptors / Indicators Tightness   Pain Type Chronic pain   Pain Onset More than a month ago       TREATMENT:  Manual Therapy R single knee to chest stretch 30s hold x 3; R piriformis FADIR stretch 30s hold x 3; R HS stretch 30s hold x 3; R hip inferior mobilizations with belt assist, hip flexed to 90, grade III, 30s/bout x 3 bouts; R hip medial to lateral mobilizations with belt assist, hip flexed at 45 degrees, grade III, 30s/bout x 3 bouts; R hip AP mobilizations, hip flexed at 90 degrees, grade III, 30s/bout x 3 bouts; STM to R piriformis performed briefly however unable to positively reproduce symptoms on this date so not much time spent with this, appears improved since last session;  Ther-ex Hooklying with pball under LEs: Double knee to chest AROM 2 x 10 reps; Lumbar trunk rotation to left and back to midline x 10 reps to facilitate right sided low back stretch to reduce RLE pain;  Pball bridges with arms at side 2 x 10; Isometric resisted lumbar rotation with manual resistance 5s hold x 10 bilateral;  Traction PT applied lumbar mechanical traction in hooklying position, 80# pull, 50# rest (20 sec on, 10 sec off) x10 min; Patient instructed to relax during  traction for better tolerance with lumbosacral pull; He reports no pain while on traction machine; He reports no pain at end of treatment session; Tolerated well;                         PT Education - 07/16/16 1529    Education provided Yes   Education Details Reinforced HEP   Person(s) Educated Patient   Methods Explanation   Comprehension Verbalized understanding             PT Long Term Goals - 07/08/16 0907      PT LONG TERM GOAL #1   Title Patient will be independent in home exercise program to improve strength/mobility for better functional independence with ADLs.   Time 4   Period Weeks   Status New     PT LONG TERM GOAL #2    Title Patient will report a worst pain of 3/10 on VAS in  right hip after standing for 30 minutes   to improve tolerance with ADLs and reduced symptoms with activities.    Time 4   Period Weeks   Status New     PT LONG TERM GOAL #3   Title Patient verbalize and/or demonstrate correct lifting technique/body mechanics with ADLs to reduce risk for re-injury to back;    Time 4   Period Weeks   Status New               Plan - 07/16/16 1529    Clinical Impression Statement Pt continues to respond to manual techniques and traction with no pain reported at the end of session today. Encouraged pt to continue HEP and follow-up as scheduled. Pending response to lumbar traction today will consider further mechanical traction.    Rehab Potential Good   Clinical Impairments Affecting Rehab Potential positive: good PLOF, active lifestyle, good caregiver support; Negative: co-morbidities, chronic pain; Patient's clinical presentation is stable as his pain is localized to right hip and down LE and is controlled with positioning;    PT Frequency 2x / week   PT Duration 4 weeks   PT Treatment/Interventions Aquatic Therapy;Cryotherapy;Electrical Stimulation;Ultrasound;Traction;Moist Heat;Functional mobility training;Therapeutic activities;Therapeutic exercise;Patient/family education;Neuromuscular re-education;Balance training;Manual techniques;Energy conservation;Dry needling;Passive range of motion   PT Next Visit Plan Spinal mobility, core strengthening, consider lumbar traction again if pt continues to respond well   PT Home Exercise Plan continue as given;    Consulted and Agree with Plan of Care Patient      Patient will benefit from skilled therapeutic intervention in order to improve the following deficits and impairments:  Pain, Decreased mobility, Decreased activity tolerance, Decreased range of motion, Hypomobility, Impaired flexibility  Visit Diagnosis: Pain in right hip  Muscle  weakness (generalized)     Problem List Patient Active Problem List   Diagnosis Date Noted  . Essential (primary) hypertension 01/29/2015  . Class 1 obesity 01/29/2015  . H/O malignant neoplasm of prostate 12/15/2014  . 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 Diabetes mellitus, type 2 ([place]) 02/07/2014  . Type 2 diabetes mellitus ([place]) 02/07/2014  . Bladder neck obstruction 01/02/2014  . Bladder retention 01/02/2014  . Abnormal prostate specific antigen 09/29/2011  . Elevated prostate specific antigen (PSA) 09/29/2011  . CA of prostate ([place]) 04/07/2011  . 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 Malignant neoplasm of prostate ([place]) 04/07/2011   Phillips Grout PT, DPT   Elynor Kallenberger 07/16/2016, 5:17 PM  [place]  [place] MAIN [facility] SERVICES [place], [place], [address] Phone: [phone]   Fax:  [phone]  Name: Chad Sandoval MRN: [id number] Date of Birth: Jun 30, 1945

## 2016-07-23 ENCOUNTER — Encounter: Payer: Self-pay | Admitting: Physical Therapy

## 2016-07-23 ENCOUNTER — Ambulatory Visit: Payer: Medicare Other | Admitting: Physical Therapy

## 2016-07-23 DIAGNOSIS — M6281 Muscle weakness (generalized): Secondary | ICD-10-CM

## 2016-07-23 DIAGNOSIS — M25551 Pain in right hip: Secondary | ICD-10-CM

## 2016-07-23 NOTE — Therapy (Signed)
[place] MAIN [facility] SERVICES [address] [place], [place], [address] Phone: [phone]   Fax:  [phone]  Physical Therapy Treatment  Patient Details  Name: Chad Sandoval MRN: [id number] Date of Birth: [birth year] Referring Provider: Dr. Manuella Ghazi  Encounter Date: 07/23/2016      PT End of Session - 07/23/16 1646    Visit Number 6   Number of Visits 10   Date for PT Re-Evaluation 08/05/16   Authorization Type gcode 6   Authorization Time Period 10   PT Start Time 1635   PT Stop Time 1715   PT Time Calculation (min) 40 min   Activity Tolerance Patient tolerated treatment well   Behavior During Therapy [facility] for tasks assessed/performed      Past Medical History:  Diagnosis Date  . History of radiation therapy 11/03/13- 12/19/13   prostate bed 6600 cGy 33 sessions  . 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 Prostate cancer [facility]) 2003   s/p radical prostatectomy  . Thrombocytopenia ([place])     Past Surgical History:  Procedure Laterality Date  . RETROPUBIC PROSTATECTOMY  03/02/2001   Gleason 7  . 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      Subjective Assessment - 07/23/16 1645    Subjective Patient reports doing well; He denies any pain currently; He reports still having intermittent right hip pain but states that it will go away when he sits down;    Pertinent History personal factors affecting rehab: Parkinson's Disease, Hx of prostate CA with bladder issues; HTN (controlled); very active;    How long can you stand comfortably? 10+ min with pain in posterior hip (R>L)   How long can you walk comfortably? 1 mile (15-20 min)   Diagnostic tests MRI shows lumbar spondylosis  causing prominent impingement at L4-5 and L5-S1 and moderate impingement at L3-L4;    Patient Stated Goals be able to cross leg, relieve pain;    Pain Onset  More than a month ago         TREATMENT: Hooklying: Hamstring neutral stretch with knee flexion/extension 5 sec hold x5 reps bilaterally;  Piriformis stretch 10 sec hold x2 bilaterally with cues to avoid straining neck and to relax into stretch for better hip flexibility;  Diaphragmatic breathing with posterior pelvic tilt with cues for positioning and to activate lower abdominal muscles with each exhalation 2x10 reps; Patient also required min Vcs to avoid hyperventilating;   Hooklying with pball under LEs: Double knee to chest AROM x5 reps; Lumbar trunk rotation to left and back to midline x10 reps to facilitate right sided low back stretch to reduce RLE pain;    Patient reports no pain in RLE hip following exercise; Instructed patient in standing posterior pelvic rocks against wall x10 reps; Patient required min VCs and tactile cues for correct technique including to avoid pushing with LEs; Patient stood for about 5-10 min and continues to have initial pain in right hip about 1/10;    PT applied Lumbar mechanical traction in hooklying position,100# pull, 75# rest (20 sec on, 10 sec off) x12 min; Patient instructed to relax during traction for better tolerance with lumbosacral pull; He reports no pain while on traction machine; He reports no pain at end of treatment session; Tolerated well;  PT Education - 07/23/16 1646    Education provided Yes   Education Details reinforced HEP, exercise technique, posture;    Person(s) Educated Patient   Methods Explanation;Demonstration;Verbal cues   Comprehension Verbalized understanding;Returned demonstration;Verbal cues required;Need further instruction             PT Long Term Goals - 07/08/16 0907      PT LONG TERM GOAL #1   Title Patient will be independent in home exercise program to improve strength/mobility for better functional independence with ADLs.   Time 4   Period  Weeks   Status New     PT LONG TERM GOAL #2   Title Patient will report a worst pain of 3/10 on VAS in  right hip after standing for 30 minutes   to improve tolerance with ADLs and reduced symptoms with activities.    Time 4   Period Weeks   Status New     PT LONG TERM GOAL #3   Title Patient verbalize and/or demonstrate correct lifting technique/body mechanics with ADLs to reduce risk for re-injury to back;    Time 4   Period Weeks   Status New               Plan - 07/23/16 1739    Clinical Impression Statement Patient instructed in advanced core stabilization exercise. While he is in hooklying position and doing exercise, he denies any increase in pain and actually reports no pain in RLE hip; However after standing for about 10 min he starts to feel right posterior hip pain; PT applied lumbar mechanical traction to reduce discomfort down RLE; Increased pull for better stretch. Patient tolerated well without pain at end of treatment session; He would benefit from additional skilled PT intervention to improve core abdominal strength and reduce pain with ADLs; He is supposed to follow up with neurosurgeon next week regarding LE pain;    Rehab Potential Good   Clinical Impairments Affecting Rehab Potential positive: good PLOF, active lifestyle, good caregiver support; Negative: co-morbidities, chronic pain; Patient's clinical presentation is stable as his pain is localized to right hip and down LE and is controlled with positioning;    PT Frequency 2x / week   PT Duration 4 weeks   PT Treatment/Interventions Aquatic Therapy;Cryotherapy;Electrical Stimulation;Ultrasound;Traction;Moist Heat;Functional mobility training;Therapeutic activities;Therapeutic exercise;Patient/family education;Neuromuscular re-education;Balance training;Manual techniques;Energy conservation;Dry needling;Passive range of motion   PT Next Visit Plan Spinal mobility, core strengthening, consider lumbar traction  again if pt continues to respond well   PT Home Exercise Plan continue as given;    Consulted and Agree with Plan of Care Patient      Patient will benefit from skilled therapeutic intervention in order to improve the following deficits and impairments:  Pain, Decreased mobility, Decreased activity tolerance, Decreased range of motion, Hypomobility, Impaired flexibility  Visit Diagnosis: Pain in right hip  Muscle weakness (generalized)     Problem List Patient Active Problem List   Diagnosis Date Noted  . Essential (primary) hypertension 01/29/2015  . Class 1 obesity 01/29/2015  . H/O malignant neoplasm of prostate 12/15/2014  . 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 Diabetes mellitus, type 2 ([place]) 02/07/2014  . Type 2 diabetes mellitus ([place]) 02/07/2014  . Bladder neck obstruction 01/02/2014  . Bladder retention 01/02/2014  . Abnormal prostate specific antigen 09/29/2011  . Elevated prostate specific antigen (PSA) 09/29/2011  . CA of prostate ([place]) 04/07/2011  . 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 Malignant neoplasm  of prostate ([place]) 04/07/2011    Trotter,Margaret PT, DPT 07/23/2016, 5:41 PM  [place] MAIN [facility] SERVICES [address] [place], [place], [address] Phone: [phone]   Fax:  [phone]  Name: PIERCE BAROCIO MRN: [id number] Date of Birth: 04/21/1945

## 2016-07-28 ENCOUNTER — Ambulatory Visit: Payer: Medicare Other | Admitting: Physical Therapy

## 2016-07-30 ENCOUNTER — Encounter: Payer: Self-pay | Admitting: Physical Therapy

## 2016-07-30 ENCOUNTER — Ambulatory Visit: Payer: Medicare Other | Attending: Neurology | Admitting: Physical Therapy

## 2016-07-30 DIAGNOSIS — M25551 Pain in right hip: Secondary | ICD-10-CM | POA: Diagnosis present

## 2016-07-30 DIAGNOSIS — M6281 Muscle weakness (generalized): Secondary | ICD-10-CM

## 2016-07-30 NOTE — Therapy (Signed)
[place] MAIN [facility] SERVICES [address] [place], [place], [address] Phone: [phone]   Fax:  [phone]  Physical Therapy Treatment  Patient Details  Name: Chad Sandoval MRN: [id number] Date of Birth: 28-Mar-1945 Referring Provider: Dr. Manuella Ghazi  Encounter Date: 07/30/2016      PT End of Session - 07/30/16 1640    Visit Number 7   Number of Visits 10   Date for PT Re-Evaluation 08/05/16   Authorization Type gcode 7   Authorization Time Period 10   PT Start Time 1616   PT Stop Time 1705   PT Time Calculation (min) 49 min   Activity Tolerance Patient tolerated treatment well;No increased pain   Behavior During Therapy WFL for tasks assessed/performed      Past Medical History:  Diagnosis Date  . History of radiation therapy 11/03/13- 12/19/13   prostate bed 6600 cGy 33 sessions  . 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 Prostate cancer [facility]) 2003   s/p radical prostatectomy  . Thrombocytopenia ([place])     Past Surgical History:  Procedure Laterality Date  . RETROPUBIC PROSTATECTOMY  03/02/2001   Gleason 7  . 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      Subjective Assessment - 07/30/16 1633    Subjective Patient reports starting prednisone pack. 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 10+ min with pain in posterior hip (R>L)   How long can you walk comfortably? 1 mile (15-20 min)   Diagnostic  tests MRI shows lumbar spondylosis  causing prominent impingement at L4-5 and L5-S1 and moderate impingement at L3-L4;    Patient Stated Goals be able to cross leg, relieve pain;    Currently in Pain? No/denies   Pain Onset More than a month ago        TREATMENT: Patient prone: PT student performed PA mobs to lumbar spine, grade III-IV PA mobs T12-L5, 20 sec bouts x2 each level;  Patient in left sidelying, right lumbar rotational mobs (lower lumbar L4-L5) grade III-IV 2 min bouts x 2 reps;  Patient exhibits good lumbosacral paraspinal movement without pain;  Following manual therapy patient exhibits better joint mobility in lumbar spine with improved pelvic rotation during gait tasks. He denies any pain with initial standing;  PT applied lumbar mechanical traction, in hooklying position,100# pull, 75# rest (20 sec on, 10 sec off) x12 min; Patient instructed to relax during traction for better tolerance with lumbosacral pull; He reports no pain while on traction machine; He reports no pain at end of treatment session; Tolerated well;  Patient reports that following each PT session he is able to feel no pain in RLE for at least another day;                           PT Education - 07/30/16 1639    Education provided  Yes   Education Details manual therapy; traction, plan of care, recommendation;    Person(s) Educated Patient   Methods Explanation;Demonstration;Verbal cues   Comprehension Verbalized understanding;Returned demonstration;Verbal cues required;Need further instruction             PT Long Term Goals - 07/08/16 6010      PT LONG TERM GOAL #1   Title Patient will be independent in home exercise program to improve strength/mobility for better functional independence with ADLs.   Time 4   Period Weeks   Status New     PT LONG TERM GOAL #2   Title Patient will report a worst pain of 3/10 on VAS in  right hip after standing for 30 minutes   to  improve tolerance with ADLs and reduced symptoms with activities.    Time 4   Period Weeks   Status New     PT LONG TERM GOAL #3   Title Patient verbalize and/or demonstrate correct lifting technique/body mechanics with ADLs to reduce risk for re-injury to back;    Time 4   Period Weeks   Status New               Plan - 07/30/16 1640    Clinical Impression Statement PT performed manual therapy to help improve joint mobility in lumbar spine. Patient tolerated well without increase in pain. He does exhibit less tightness following manual therapy; Applied lumbar mechanical traction in hooklying to reduce stiffness/pain; Patient tolerated well with no pain at end of session. He would benefit from additional skilled PT intervention to improve joint mobility and reduce pain. Plan to reduce to 1x a week as patient is responding well to  medication and not having much pain; Patient agreeable.    Rehab Potential Good   Clinical Impairments Affecting Rehab Potential positive: good PLOF, active lifestyle, good caregiver support; Negative: co-morbidities, chronic pain; Patient's clinical presentation is stable as his pain is localized to right hip and down LE and is controlled with positioning;    PT Frequency 1x / week   PT Duration 4 weeks   PT Treatment/Interventions Aquatic Therapy;Cryotherapy;Electrical Stimulation;Ultrasound;Traction;Moist Heat;Functional mobility training;Therapeutic activities;Therapeutic exercise;Patient/family education;Neuromuscular re-education;Balance training;Manual techniques;Energy conservation;Dry needling;Passive range of motion   PT Next Visit Plan Spinal mobility, core strengthening, consider lumbar traction again if pt continues to respond well   PT Home Exercise Plan continue as given;    Consulted and Agree with Plan of Care Patient      Patient will benefit from skilled therapeutic intervention in order to improve the following deficits and impairments:   Pain, Decreased mobility, Decreased activity tolerance, Decreased range of motion, Hypomobility, Impaired flexibility  Visit Diagnosis: Pain in right hip  Muscle weakness (generalized)     Problem List Patient Active Problem List   Diagnosis Date Noted  . Essential (primary) hypertension 01/29/2015  . Class 1 obesity 01/29/2015  . H/O malignant neoplasm of prostate 12/15/2014  . 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 Diabetes mellitus, type 2 ([place]) 02/07/2014  . Type 2 diabetes mellitus ([place]) 02/07/2014  . Bladder neck obstruction 01/02/2014  . Bladder retention 01/02/2014  . Abnormal prostate specific antigen 09/29/2011  . Elevated prostate specific antigen (PSA) 09/29/2011  . CA of prostate ([place]) 04/07/2011  . 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 Malignant neoplasm of prostate ([place]) 04/07/2011   Devan Groulx, SPT This entire session was performed under direct supervision and direction of a licensed therapist/therapist assistant . I  have personally read, edited and approve of the note as written.  Jacobey Gura PT, DPT 07/30/2016, 6:31 PM  [place] MAIN [facility] SERVICES [address] [place], [place], [address] Phone: [phone]   Fax:  [phone]  Name: HATCHER FRONING MRN: [id number] Date of Birth: 1945/04/17

## 2016-08-04 ENCOUNTER — Ambulatory Visit: Payer: Medicare Other | Admitting: Physical Therapy

## 2016-08-04 ENCOUNTER — Encounter: Payer: Self-pay | Admitting: Physical Therapy

## 2016-08-04 DIAGNOSIS — M25551 Pain in right hip: Secondary | ICD-10-CM | POA: Diagnosis not present

## 2016-08-04 DIAGNOSIS — M6281 Muscle weakness (generalized): Secondary | ICD-10-CM

## 2016-08-04 NOTE — Therapy (Signed)
[place] MAIN [facility] SERVICES [address] [place], [place], [address] Phone: [phone]   Fax:  [phone]  Physical Therapy Treatment  Patient Details  Name: CHAISE MAHABIR MRN: [id number] Date of Birth: 18-Mar-1945 Referring Provider: Dr. Manuella Ghazi  Encounter Date: 08/04/2016      PT End of Session - 08/04/16 1639    Visit Number 8   Number of Visits 14   Date for PT Re-Evaluation 09/01/16   Authorization Type gcode 8   Authorization Time Period 10   PT Start Time 1632   PT Stop Time 9702   PT Time Calculation (min) 43 min   Activity Tolerance Patient tolerated treatment well;No increased pain   Behavior During Therapy WFL for tasks assessed/performed      Past Medical History:  Diagnosis Date  . History of radiation therapy 11/03/13- 12/19/13   prostate bed 6600 cGy 33 sessions  . 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 Prostate cancer [facility]) 2003   s/p radical prostatectomy  . Thrombocytopenia ([place])     Past Surgical History:  Procedure Laterality Date  . RETROPUBIC PROSTATECTOMY  03/02/2001   Gleason 7  . 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      Subjective Assessment - 08/04/16 1638    Subjective Patient reports feeling some soreness on right side of low back Thursday and Friday since last session but then resolved by Friday. He reports no pain currently; He reports that he hasn't noticed much pain down RLE since last session;    Pertinent History personal factors affecting rehab: Parkinson's Disease, Hx of prostate CA with bladder issues; HTN (controlled); very active;    How long can you stand comfortably? 10+ min with pain in posterior hip (R>L)   How long can you walk comfortably? 1 mile (15-20 min)   Diagnostic tests MRI shows lumbar spondylosis  causing prominent impingement at L4-5 and L5-S1 and moderate  impingement at L3-L4;    Patient Stated Goals be able to cross leg, relieve pain;    Currently in Pain? No/denies   Multiple Pain Sites No        TREATMENT: Patient in qped position: Cat/camel stretch 5 sec hold x 6 reps  With cues to isolate lumbopelvic movement and reduce thoracic stretch; Patient had significant difficulty isolate lumbar lordosis requiring visual and tactile cues for better pelvic movement;   Instructed patient in Onset Dog, UE/LE single limb extension with 5 sec hold and deep breath to isolate core abdominal contraction and lumbar erector spinae contraction; x3 reps each limb with cues to slow down UE/LE movement and to improve positioning for better core stabilization and trunk control; Patient exhibits increased UE muscle fasciculations with increased UE weight bearing due to weakness; He denies any increase in pain despite difficulty of exercise;   Patient in hooklying: Bridges with arms across chest, feet apart, 5 sec hold x10 reps with cues to avoid painful ROM, increase hold time and increase gluteal contraction for better hip extensor strengthening;  Single leg bridge x5 reps with cues to avoid trunk rotation for better hip strengthening;   Hamstring stretch SLR contract/relax 5 sec hold x3 reps each LE to improve tissue extensibility for better LE movement and less discomfort;    PT applied lumbar mechanical traction, in hooklying position,120# pull, 80#rest (20 sec on, 10  sec off) x14 min; Patient instructed to relax during traction for better tolerance with lumbosacral pull; He reports no pain while on traction machine; He reports no pain at end of treatment session; Tolerated well;  Patient reports that following each PT session he is able to feel no pain in RLE for at least another day;                   PT Education - 08/04/16 1638    Education provided Yes   Education Details lumbar stretch/strengthening, stretches; manual therapy;     Person(s) Educated Patient   Methods Explanation;Demonstration;Verbal cues   Comprehension Verbalized understanding;Returned demonstration;Verbal cues required;Need further instruction             PT Long Term Goals - 08/04/16 1705      PT LONG TERM GOAL #1   Title Patient will be independent in home exercise program to improve strength/mobility for better functional independence with ADLs.   Time 4   Period Weeks   Status On-going     PT LONG TERM GOAL #2   Title Patient will report a worst pain of 3/10 on VAS in  right hip after standing for 30 minutes   to improve tolerance with ADLs and reduced symptoms with activities.    Time 4   Period Weeks   Status Partially Met     PT LONG TERM GOAL #3   Title Patient verbalize and/or demonstrate correct lifting technique/body mechanics with ADLs to reduce risk for re-injury to back;    Time 4   Period Weeks   Status On-going               Plan - 08/04/16 1649    Clinical Impression Statement Patient instructed in advanced lumbar/core stabilization exercise. He requires cues for correct positioning to improve core strength and reduce discomfort. Patient denies any pain with advanced exercise, but does report fatigue. He exhibits tremors in UE with advanced exercise due to weakness. He would benefit from additional skilled PT intervention to improve strength, core stabilization and reduce pain with ADLs;    Rehab Potential Good   Clinical Impairments Affecting Rehab Potential positive: good PLOF, active lifestyle, good caregiver support; Negative: co-morbidities, chronic pain; Patient's clinical presentation is stable as his pain is localized to right hip and down LE and is controlled with positioning;    PT Frequency 1x / week   PT Duration 4 weeks   PT Treatment/Interventions Aquatic Therapy;Cryotherapy;Electrical Stimulation;Ultrasound;Traction;Moist Heat;Functional mobility training;Therapeutic activities;Therapeutic  exercise;Patient/family education;Neuromuscular re-education;Balance training;Manual techniques;Energy conservation;Dry needling;Passive range of motion   PT Next Visit Plan Spinal mobility, core strengthening, consider lumbar traction again if pt continues to respond well   PT Home Exercise Plan continue as given;    Consulted and Agree with Plan of Care Patient      Patient will benefit from skilled therapeutic intervention in order to improve the following deficits and impairments:  Pain, Decreased mobility, Decreased activity tolerance, Decreased range of motion, Hypomobility, Impaired flexibility  Visit Diagnosis: Pain in right hip - Plan: PT plan of care cert/re-cert  Muscle weakness (generalized) - Plan: PT plan of care cert/re-cert     Problem List Patient Active Problem List   Diagnosis Date Noted  . Essential (primary) hypertension 01/29/2015  . Class 1 obesity 01/29/2015  . H/O malignant neoplasm of prostate 12/15/2014  . 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 Diabetes mellitus, type 2 ([place]) 02/07/2014  . Type 2 diabetes  mellitus ([place]) 02/07/2014  . Bladder neck obstruction 01/02/2014  . Bladder retention 01/02/2014  . Abnormal prostate specific antigen 09/29/2011  . Elevated prostate specific antigen (PSA) 09/29/2011  . CA of prostate ([place]) 04/07/2011  . 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 Malignant neoplasm of prostate ([place]) 04/07/2011   Devan Groulx, SPT This entire session was performed under direct supervision and direction of a licensed therapist/therapist assistant . I have personally read, edited and approve of the note as written.   Trotter,Margaret PT, DPT 08/04/2016, 5:15 PM  [place] MAIN [facility] SERVICES [address] [place], [place], [address] Phone: [phone]   Fax:  [phone]  Name: JUSTINO BOZE MRN: [id number] Date of Birth: 1945-09-14

## 2016-08-06 ENCOUNTER — Ambulatory Visit: Payer: Medicare Other | Admitting: Physical Therapy

## 2016-08-11 ENCOUNTER — Ambulatory Visit: Payer: Medicare Other | Admitting: Physical Therapy

## 2016-08-11 ENCOUNTER — Encounter: Payer: Self-pay | Admitting: Physical Therapy

## 2016-08-11 DIAGNOSIS — M25551 Pain in right hip: Secondary | ICD-10-CM

## 2016-08-11 DIAGNOSIS — M6281 Muscle weakness (generalized): Secondary | ICD-10-CM

## 2016-08-11 NOTE — Therapy (Signed)
[place] MAIN [facility] SERVICES [address] [place], [place], [address] Phone: [phone]   Fax:  [phone]  Physical Therapy Treatment  Patient Details  Name: Chad Sandoval MRN: [id number] Date of Birth: 03/23/1945 Referring Provider: Dr. Manuella Ghazi  Encounter Date: 08/11/2016      PT End of Session - 08/11/16 1703    Visit Number 9   Number of Visits 14   Date for PT Re-Evaluation 09/01/16   Authorization Type gcode 9   Authorization Time Period 10   PT Start Time 1632   PT Stop Time 0814   PT Time Calculation (min) 43 min   Activity Tolerance Patient tolerated treatment well;No increased pain   Behavior During Therapy WFL for tasks assessed/performed      Past Medical History:  Diagnosis Date  . History of radiation therapy 11/03/13- 12/19/13   prostate bed 6600 cGy 33 sessions  . 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 Prostate cancer [facility]) 2003   s/p radical prostatectomy  . Thrombocytopenia ([place])     Past Surgical History:  Procedure Laterality Date  . RETROPUBIC PROSTATECTOMY  03/02/2001   [facility] 7  . SPLENECTOMY  04/2012   [facility]    There were no vitals filed for this visit.      Subjective Assessment - 08/11/16 1634    Subjective Patient reports doing well; He reports not having a lot of back pain this week with minimal pain when standing. He reports a little soreness in right lumbar paraspinals from playing golf earlier but that's not the same kind of pain;    Pertinent History personal factors affecting rehab: Parkinson's Disease, Hx of prostate CA with bladder issues; HTN (controlled); very active;    How long can you stand comfortably? 10+ min with pain in posterior hip (R>L)   How long can you walk comfortably? 1 mile (15-20 min)   Diagnostic tests MRI shows lumbar spondylosis  causing prominent impingement at L4-5 and L5-S1 and moderate  impingement at L3-L4;    Patient Stated Goals be able to cross leg, relieve pain;    Currently in Pain? No/denies         TREATMENT: Patient prone: PT performed soft/deep tissue massage to right quadratus lumborum; Patient exhibits increased tightness with tenderness along attachment at lower rib. PT also performed soft tissue massage with Edge tool to improve tissue extensibility and reduce tightness. Patient able to exhibit better myofascial movement and less tightness following manual therapy with no tenderness reported x15 min;  PT performed grade III-IV PA mobs to lumbar spine L1, L2, L3, L4, L5, 10 sec bouts x3 each with increased stiffness noted between L3/L4; Patient denies any pain during joint mobs. He is able to exhibit better joint play with increased repetition of mobilizations with less tightness after manual treatment;  PT instructed patient in quadratus lumborum stretch with child pose 15 sec hold x2 with cues for positioning to improve lumbar stretch;   PT applied lumbar mechanical traction, in hooklying position,120# pull, 80#rest (20 sec on, 10 sec off) x14 min; Patient instructed to relax during traction for better tolerance with lumbosacral pull; He reports no pain while on traction machine; He reports no pain at end of treatment session; Tolerated well;  Patient reports that following each PT session he is able to feel no pain in RLE for at least another day;  PT Education - 08/11/16 1703    Education provided Yes   Education Details manual therapy, traction; HEP reinforced; recommendations;    Person(s) Educated Patient   Methods Explanation;Demonstration;Verbal cues   Comprehension Verbalized understanding;Returned demonstration;Verbal cues required;Need further instruction             PT Long Term Goals - 08/04/16 1705      PT LONG TERM GOAL #1   Title Patient will be independent in home exercise program  to improve strength/mobility for better functional independence with ADLs.   Time 4   Period Weeks   Status On-going     PT LONG TERM GOAL #2   Title Patient will report a worst pain of 3/10 on VAS in  right hip after standing for 30 minutes   to improve tolerance with ADLs and reduced symptoms with activities.    Time 4   Period Weeks   Status Partially Met     PT LONG TERM GOAL #3   Title Patient verbalize and/or demonstrate correct lifting technique/body mechanics with ADLs to reduce risk for re-injury to back;    Time 4   Period Weeks   Status On-going               Plan - 08/11/16 1704    Clinical Impression Statement Patient responded well to manual therapy with less back pain and tightness. He exhibits better tissue extensibility with less myofascial tightness following manual therapy; Patient was educated in quadratus lumborum stretch to reduce discomfort especially after golf. Patient responds well to lumbar traction with no pain at end of session; Plan to put patient on hold for 3-4 weeks while he goes out of town/helps wife. Will re-assess next month to determine long term effects of conservative treatment for pain relief.    Rehab Potential Good   Clinical Impairments Affecting Rehab Potential positive: good PLOF, active lifestyle, good caregiver support; Negative: co-morbidities, chronic pain; Patient's clinical presentation is stable as his pain is localized to right hip and down LE and is controlled with positioning;    PT Frequency 1x / week   PT Duration 4 weeks   PT Treatment/Interventions Aquatic Therapy;Cryotherapy;Electrical Stimulation;Ultrasound;Traction;Moist Heat;Functional mobility training;Therapeutic activities;Therapeutic exercise;Patient/family education;Neuromuscular re-education;Balance training;Manual techniques;Energy conservation;Dry needling;Passive range of motion   PT Next Visit Plan Spinal mobility, core strengthening, consider lumbar traction  again if pt continues to respond well   PT Home Exercise Plan continue as given;    Consulted and Agree with Plan of Care Patient      Patient will benefit from skilled therapeutic intervention in order to improve the following deficits and impairments:  Pain, Decreased mobility, Decreased activity tolerance, Decreased range of motion, Hypomobility, Impaired flexibility  Visit Diagnosis: Pain in right hip  Muscle weakness (generalized)     Problem List Patient Active Problem List   Diagnosis Date Noted  . Essential (primary) hypertension 01/29/2015  . Class 1 obesity 01/29/2015  . H/O malignant neoplasm of prostate 12/15/2014  . 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 Diabetes mellitus, type 2 ([place]) 02/07/2014  . Type 2 diabetes mellitus ([place]) 02/07/2014  . Bladder neck obstruction 01/02/2014  . Bladder retention 01/02/2014  . Abnormal prostate specific antigen 09/29/2011  . Elevated prostate specific antigen (PSA) 09/29/2011  . CA of prostate ([place]) 04/07/2011  . 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 Malignant neoplasm of prostate ([place]) 04/07/2011    Valarie Farace PT, DPT 08/11/2016, 5:07 PM  [place]  [place] MAIN [facility] SERVICES [place], [place], [address] Phone: [phone]   Fax:  [phone]  Name: Chad Sandoval MRN: [id number] Date of Birth: Jun 30, 1945

## 2016-08-13 ENCOUNTER — Ambulatory Visit: Payer: Medicare Other | Admitting: Physical Therapy

## 2016-08-18 ENCOUNTER — Ambulatory Visit: Payer: Medicare Other | Admitting: Physical Therapy

## 2016-08-20 ENCOUNTER — Ambulatory Visit: Payer: Medicare Other | Admitting: Physical Therapy

## 2016-08-28 ENCOUNTER — Ambulatory Visit
Admission: RE | Admit: 2016-08-28 | Discharge: 2016-08-28 | Disposition: A | Discharge status: Home / Self Care | Payer: Medicare Other | Source: Ambulatory Visit | Attending: Internal Medicine | Admitting: Internal Medicine

## 2016-08-28 DIAGNOSIS — I2510 Atherosclerotic heart disease of native coronary artery without angina pectoris: Secondary | ICD-10-CM | POA: Insufficient documentation

## 2016-08-28 DIAGNOSIS — C8307 Small cell B-cell lymphoma, spleen: Secondary | ICD-10-CM | POA: Insufficient documentation

## 2016-08-28 DIAGNOSIS — I700 Atherosclerosis of aorta: Secondary | ICD-10-CM | POA: Diagnosis not present

## 2016-08-28 DIAGNOSIS — N200 Calculus of kidney: Secondary | ICD-10-CM | POA: Insufficient documentation

## 2016-08-28 LAB — GLUCOSE, CAPILLARY: GLUCOSE-CAPILLARY: 110 mg/dL — AB (ref 65–99)

## 2016-08-28 MED ORDER — FLUDEOXYGLUCOSE F - 18 (FDG) INJECTION
13.0200 | Freq: Once | INTRAVENOUS | Status: AC | PRN
  Administered 2016-08-28: 13.02 via INTRAVENOUS

## 2016-09-01 ENCOUNTER — Ambulatory Visit: Payer: Medicare Other | Admitting: Internal Medicine

## 2016-09-01 ENCOUNTER — Other Ambulatory Visit: Payer: Medicare Other

## 2016-09-05 ENCOUNTER — Ambulatory Visit: Payer: Medicare Other

## 2016-09-08 ENCOUNTER — Inpatient Hospital Stay: Payer: Medicare Other

## 2016-09-08 ENCOUNTER — Inpatient Hospital Stay: Payer: Medicare Other | Attending: Internal Medicine | Admitting: Internal Medicine

## 2016-09-08 VITALS — BP 133/87 | HR 75 | Temp 97.5°F | Resp 18 | Wt 227.4 lb

## 2016-09-08 DIAGNOSIS — Z8572 Personal history of non-Hodgkin lymphomas: Secondary | ICD-10-CM | POA: Insufficient documentation

## 2016-09-08 DIAGNOSIS — N200 Calculus of kidney: Secondary | ICD-10-CM | POA: Diagnosis not present

## 2016-09-08 DIAGNOSIS — Z8042 Family history of malignant neoplasm of prostate: Secondary | ICD-10-CM | POA: Insufficient documentation

## 2016-09-08 DIAGNOSIS — Z8546 Personal history of malignant neoplasm of prostate: Secondary | ICD-10-CM | POA: Insufficient documentation

## 2016-09-08 DIAGNOSIS — I10 Essential (primary) hypertension: Secondary | ICD-10-CM | POA: Diagnosis not present

## 2016-09-08 DIAGNOSIS — Z9081 Acquired absence of spleen: Secondary | ICD-10-CM | POA: Insufficient documentation

## 2016-09-08 DIAGNOSIS — Z7982 Long term (current) use of aspirin: Secondary | ICD-10-CM | POA: Diagnosis not present

## 2016-09-08 DIAGNOSIS — Z79899 Other long term (current) drug therapy: Secondary | ICD-10-CM | POA: Diagnosis not present

## 2016-09-08 DIAGNOSIS — C8307 Small cell B-cell lymphoma, spleen: Secondary | ICD-10-CM

## 2016-09-08 DIAGNOSIS — C61 Malignant neoplasm of prostate: Secondary | ICD-10-CM | POA: Diagnosis not present

## 2016-09-08 DIAGNOSIS — D696 Thrombocytopenia, unspecified: Secondary | ICD-10-CM | POA: Diagnosis not present

## 2016-09-08 DIAGNOSIS — I700 Atherosclerosis of aorta: Secondary | ICD-10-CM | POA: Insufficient documentation

## 2016-09-08 DIAGNOSIS — Z923 Personal history of irradiation: Secondary | ICD-10-CM | POA: Diagnosis not present

## 2016-09-08 DIAGNOSIS — G20 Parkinson's disease: Secondary | ICD-10-CM | POA: Diagnosis not present

## 2016-09-08 DIAGNOSIS — Z9079 Acquired absence of other genital organ(s): Secondary | ICD-10-CM | POA: Insufficient documentation

## 2016-09-08 LAB — CBC WITH DIFFERENTIAL/PLATELET
Basophils Absolute: 0.1 10*3/uL (ref 0–0.1)
Basophils Relative: 1 %
EOS ABS: 0.1 10*3/uL (ref 0–0.7)
Eosinophils Relative: 1 %
HEMATOCRIT: 46.1 % (ref 40.0–52.0)
HEMOGLOBIN: 15.9 g/dL (ref 13.0–18.0)
LYMPHS ABS: 3.3 10*3/uL (ref 1.0–3.6)
Lymphocytes Relative: 28 %
MCH: 31.6 pg (ref 26.0–34.0)
MCHC: 34.6 g/dL (ref 32.0–36.0)
MCV: 91.6 fL (ref 80.0–100.0)
MONO ABS: 1 10*3/uL (ref 0.2–1.0)
MONOS PCT: 9 %
NEUTROS ABS: 7.3 10*3/uL — AB (ref 1.4–6.5)
NEUTROS PCT: 61 %
Platelets: 291 10*3/uL (ref 150–440)
RBC: 5.04 MIL/uL (ref 4.40–5.90)
RDW: 13.4 % (ref 11.5–14.5)
WBC: 11.8 10*3/uL — ABNORMAL HIGH (ref 3.8–10.6)

## 2016-09-08 LAB — COMPREHENSIVE METABOLIC PANEL
ALK PHOS: 89 U/L (ref 38–126)
ALT: 6 U/L — ABNORMAL LOW (ref 17–63)
ANION GAP: 6 (ref 5–15)
AST: 24 U/L (ref 15–41)
Albumin: 4.7 g/dL (ref 3.5–5.0)
BILIRUBIN TOTAL: 0.7 mg/dL (ref 0.3–1.2)
BUN: 25 mg/dL — ABNORMAL HIGH (ref 6–20)
CALCIUM: 10 mg/dL (ref 8.9–10.3)
CO2: 27 mmol/L (ref 22–32)
Chloride: 106 mmol/L (ref 101–111)
Creatinine, Ser: 1.03 mg/dL (ref 0.61–1.24)
Glucose, Bld: 121 mg/dL — ABNORMAL HIGH (ref 65–99)
Potassium: 4.7 mmol/L (ref 3.5–5.1)
Sodium: 139 mmol/L (ref 135–145)
TOTAL PROTEIN: 7.1 g/dL (ref 6.5–8.1)

## 2016-09-08 NOTE — Progress Notes (Signed)
Lobular myeloproliferative is able to be treated.[place] OFFICE PROGRESS NOTE  Patient Care Team: Dion Body, MD as PCP - General (Family Medicine)  CA of prostate [facility])   Staging form: Prostate, AJCC 7th Edition     Clinical: No stage assigned - Unsigned    Oncology History   thrombocytopenia, transient neutropenia enlarged spleen for several years.patient was being followed at [facility]. 2.Bone marrow biopsy (October, 2012)  No diagnostic features suggestive of myeloproliferative disease.   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 Recent biopsy from right parotid gland because of abnormality on a PET scan (December, 2016) was negative for any lymphomatous involvement [Dr.Bennett]  # PROSTATE CANCER [Jan 2003];s/p RT [Aug 2015]- PSA [Dr.Davis; [facility]; [place]]   # Parkinson disease [phone]- Dr.Shah]     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[place])   07/28/2014 Initial Diagnosis    Marginal zone lymphoma of spleen       INTERVAL HISTORY:   GEOFF DACANAY 71 y.o.  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   Patient states that his swelling anterior to the right ear/parotid area- has resolved.  He otherwise denies any unusual weight loss.  Denies any unusual fevers. Denies any night sweats. Denies any new lumps or bumps.   REVIEW OF SYSTEMS:  A complete 10 point review of system is done which is negative except mentioned above/history of present illness.   PAST MEDICAL HISTORY :  Past Medical History:  Diagnosis Date  . History of radiation  therapy 11/03/13- 12/19/13   prostate bed 6600 cGy 33 sessions  . 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 Prostate cancer [facility]) 2003   s/p radical prostatectomy  . Thrombocytopenia ([place])     PAST SURGICAL HISTORY :   Past Surgical History:  Procedure Laterality Date  . RETROPUBIC PROSTATECTOMY  03/02/2001   Gleason 7  . SPLENECTOMY  04/2012   [facility]    FAMILY HISTORY :   Family History  Problem Relation Age of Onset  . Cancer Father        prostate  . Alzheimer's disease Mother     SOCIAL HISTORY:   Social History  Substance Use Topics  . Smoking status: Never Smoker  . Smokeless tobacco: Never Used  . Alcohol use No    ALLERGIES:  is allergic to niacin and oxycontin [oxycodone hcl].  MEDICATIONS:  Current Outpatient Prescriptions  Medication Sig Dispense Refill  . acetaminophen (TYLENOL) 500 MG tablet Take 500 mg by mouth every 6 (six) hours as needed.    Marland Kitchen aspirin 81 MG chewable tablet Chew 81 mg by mouth.    . B Complex Vitamins (VITAMIN-B COMPLEX) TABS Take by mouth.    . carbidopa-levodopa (SINEMET IR) 25-100 MG tablet Take 2 tablets by mouth 3 (three) times daily.     . Cholecalciferol (VITAMIN D3) 10000 UNITS TABS Take by mouth.    Marland Kitchen lisinopril (PRINIVIL,ZESTRIL) 10 MG tablet Take by mouth.    . Multiple Vitamin (MULTI-VITAMINS) TABS Take 1 tablet  by mouth.    . omega-3 acid ethyl esters (LOVAZA) 1 g capsule Take by mouth 2 (two) times daily.    . sildenafil (VIAGRA) 100 MG tablet Take 100 mg by mouth.     No current facility-administered medications for this visit.     PHYSICAL EXAMINATION: ECOG PERFORMANCE STATUS: 0 - Asymptomatic  BP 133/87   Pulse 75   Temp (!) 97.5 F (36.4 C) (Tympanic)   Resp 18   Wt 227 lb 6.4 oz (103.1 kg)   BMI 33.58 kg/m   Filed Weights   09/08/16 1052  Weight: 227 lb 6.4 oz (103.1 kg)    GENERAL: Well-nourished well-developed; Alert, no distress and  comfortable.   With his wife. EYES: no pallor or icterus OROPHARYNX: no thrush or ulceration; good dentition  NECK: supple, NO cervical LN LYMPH:  no palpable lymphadenopathy axillary or inguinal regions LUNGS: clear to auscultation and  No wheeze or crackles HEART/CVS: regular rate & rhythm and no murmurs; No lower extremity edema ABDOMEN:abdomen soft, non-tender and normal bowel sounds Musculoskeletal:no cyanosis of digits and no clubbing  PSYCH: alert & oriented x 3 with fluent speech NEURO: no focal motor/sensory deficits SKIN:  no rashes or significant lesions  LABORATORY DATA:  I have reviewed the data as listed    Component Value Date/Time   NA 139 09/08/2016 1012   NA 143 01/25/2014 1128   K 4.7 09/08/2016 1012   K 4.3 01/25/2014 1128   CL 106 09/08/2016 1012   CL 106 01/25/2014 1128   CO2 27 09/08/2016 1012   CO2 29 01/25/2014 1128   GLUCOSE 121 (H) 09/08/2016 1012   GLUCOSE 111 (H) 01/25/2014 1128   BUN 25 (H) 09/08/2016 1012   BUN 14 01/25/2014 1128   CREATININE 1.03 09/08/2016 1012   CREATININE 1.03 01/25/2014 1128   CALCIUM 10.0 09/08/2016 1012   CALCIUM 9.5 01/25/2014 1128   PROT 7.1 09/08/2016 1012   PROT 6.8 01/25/2014 1128   ALBUMIN 4.7 09/08/2016 1012   ALBUMIN 4.1 01/25/2014 1128   AST 24 09/08/2016 1012   AST 22 01/25/2014 1128   ALT 6 (L) 09/08/2016 1012   ALT 43 01/25/2014 1128   ALKPHOS 89 09/08/2016 1012   ALKPHOS 114 01/25/2014 1128   BILITOT 0.7 09/08/2016 1012   BILITOT 0.7 01/25/2014 1128   GFRNONAA >60 09/08/2016 1012   GFRNONAA >60 01/25/2014 1128   GFRNONAA >60 07/13/2013 0951   GFRAA >60 09/08/2016 1012   GFRAA >60 01/25/2014 1128   GFRAA >60 07/13/2013 0951    No results found for: SPEP, UPEP  Lab Results  Component Value Date   WBC 11.8 (H) 09/08/2016   NEUTROABS 7.3 (H) 09/08/2016   HGB 15.9 09/08/2016   HCT 46.1 09/08/2016   MCV 91.6 09/08/2016   PLT 291 09/08/2016      Chemistry      Component Value Date/Time    NA 139 09/08/2016 1012   NA 143 01/25/2014 1128   K 4.7 09/08/2016 1012   K 4.3 01/25/2014 1128   CL 106 09/08/2016 1012   CL 106 01/25/2014 1128   CO2 27 09/08/2016 1012   CO2 29 01/25/2014 1128   BUN 25 (H) 09/08/2016 1012   BUN 14 01/25/2014 1128   CREATININE 1.03 09/08/2016 1012   CREATININE 1.03 01/25/2014 1128      Component Value Date/Time   CALCIUM 10.0 09/08/2016 1012   CALCIUM 9.5 01/25/2014 1128   ALKPHOS 89 09/08/2016 1012   ALKPHOS  114 01/25/2014 1128   AST 24 09/08/2016 1012   AST 22 01/25/2014 1128   ALT 6 (L) 09/08/2016 1012   ALT 43 01/25/2014 1128   BILITOT 0.7 09/08/2016 1012   BILITOT 0.7 01/25/2014 1128     IMPRESSION: 1. No new or suspicious areas of hypermetabolism on today's study. The activity identified in the right parotid gland and right submandibular lymph node on the prior study has resolved in the interval. 2. Focal hypermetabolism identified within a portal caval lymph node on the prior study has resolved. 3. Coronary artery and abdominal aortic atherosclerosis. 4. Nonobstructing right renal stone.   Electronically Signed   By: Misty Stanley M.D.   On: 08/28/2016 12:31  RADIOGRAPHIC STUDIES: I have personally reviewed the radiological images as listed and agreed with the findings in the report. No results found.   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 Continue monitor for now. CBC within normal limits. PET scan July 2018- no evidence of any recurrence.   # Right ant ear- mass- s/p Bx dec 2016- reactive; currently resolved. Not apparent on PET scan. Will inform Dr.Bennett.   # Prostate cancer- with recurrence in 2014 in the prostate bed status post radiation. Currently being followed by PSA by his urologist; Dr.Davis [[place]]. Currently clinically asymptomatic.recent PSA- 0.01.   # Patient follow-up with [place] in approximately 6 months; labs. Discussed  that we will follow the patient on a clinical basis re: lymphoma.    # Discussed the incidental findings noted on the PET scan- atherosclerosis/small kidney stones. Clinically insignificant; however monitor for symptoms.   # I reviewed the blood work- with the patient in detail; also reviewed the imaging independently [as summarized above]; and with the patient in detail.    Orders Placed This Encounter  Procedures  . CBC with Differential    Standing Status:   Future    Standing Expiration Date:   09/08/2017  . Comprehensive metabolic panel    Standing Status:   Future    Standing Expiration Date:   09/08/2017  . Lactate dehydrogenase    Standing Status:   Future    Standing Expiration Date:   09/08/2017   All questions were answered. The patient knows to call the clinic with any problems, questions or concerns.      Cammie Sickle, MD 09/08/2016 1:35 PM

## 2016-09-08 NOTE — Assessment & Plan Note (Addendum)
Status post splenectomy;  clinically no evidence of recurrence. Continue monitor for now. CBC within normal limits. PET scan July 2018- no evidence of any recurrence.   # Right ant ear- mass- s/p Bx dec 2016- reactive; currently resolved. Not apparent on PET scan. Will inform Dr.Bennett.   # Prostate cancer- with recurrence in 2014 in the prostate bed status post radiation. Currently being followed by PSA by his urologist; Dr.Davis [[place]]. Currently clinically asymptomatic.recent PSA- 0.01.   # Patient follow-up with [place] in approximately 6 months; labs. Discussed that we will follow the patient on a clinical basis re: lymphoma.    # Discussed the incidental findings noted on the PET scan- atherosclerosis/small kidney stones. Clinically insignificant; however monitor for symptoms.   # I reviewed the blood work- with the patient in detail; also reviewed the imaging independently [as summarized above]; and with the patient in detail.

## 2016-09-08 NOTE — Progress Notes (Signed)
Patient does not offer any problems today.  

## 2017-03-11 ENCOUNTER — Inpatient Hospital Stay: Payer: Medicare Other | Attending: Internal Medicine | Admitting: Internal Medicine

## 2017-03-11 ENCOUNTER — Inpatient Hospital Stay: Payer: Medicare Other

## 2017-03-11 VITALS — BP 126/87 | HR 80 | Temp 97.8°F | Resp 16 | Wt 227.0 lb

## 2017-03-11 DIAGNOSIS — I10 Essential (primary) hypertension: Secondary | ICD-10-CM | POA: Diagnosis not present

## 2017-03-11 DIAGNOSIS — Z8546 Personal history of malignant neoplasm of prostate: Secondary | ICD-10-CM | POA: Diagnosis not present

## 2017-03-11 DIAGNOSIS — C8307 Small cell B-cell lymphoma, spleen: Secondary | ICD-10-CM

## 2017-03-11 DIAGNOSIS — Z7982 Long term (current) use of aspirin: Secondary | ICD-10-CM | POA: Insufficient documentation

## 2017-03-11 DIAGNOSIS — Z8042 Family history of malignant neoplasm of prostate: Secondary | ICD-10-CM | POA: Insufficient documentation

## 2017-03-11 DIAGNOSIS — D696 Thrombocytopenia, unspecified: Secondary | ICD-10-CM | POA: Diagnosis not present

## 2017-03-11 DIAGNOSIS — Z923 Personal history of irradiation: Secondary | ICD-10-CM | POA: Diagnosis not present

## 2017-03-11 DIAGNOSIS — Z79899 Other long term (current) drug therapy: Secondary | ICD-10-CM | POA: Diagnosis not present

## 2017-03-11 DIAGNOSIS — Z8572 Personal history of non-Hodgkin lymphomas: Secondary | ICD-10-CM | POA: Diagnosis not present

## 2017-03-11 DIAGNOSIS — G20 Parkinson's disease: Secondary | ICD-10-CM | POA: Diagnosis not present

## 2017-03-11 DIAGNOSIS — Z885 Allergy status to narcotic agent status: Secondary | ICD-10-CM | POA: Insufficient documentation

## 2017-03-11 DIAGNOSIS — Z9081 Acquired absence of spleen: Secondary | ICD-10-CM | POA: Insufficient documentation

## 2017-03-11 DIAGNOSIS — Z9079 Acquired absence of other genital organ(s): Secondary | ICD-10-CM | POA: Insufficient documentation

## 2017-03-11 LAB — COMPREHENSIVE METABOLIC PANEL
ALT: 5 U/L — ABNORMAL LOW (ref 17–63)
ANION GAP: 10 (ref 5–15)
AST: 21 U/L (ref 15–41)
Albumin: 4.4 g/dL (ref 3.5–5.0)
Alkaline Phosphatase: 87 U/L (ref 38–126)
BUN: 22 mg/dL — ABNORMAL HIGH (ref 6–20)
CHLORIDE: 102 mmol/L (ref 101–111)
CO2: 27 mmol/L (ref 22–32)
Calcium: 9.6 mg/dL (ref 8.9–10.3)
Creatinine, Ser: 1.01 mg/dL (ref 0.61–1.24)
Glucose, Bld: 119 mg/dL — ABNORMAL HIGH (ref 65–99)
POTASSIUM: 4.8 mmol/L (ref 3.5–5.1)
Sodium: 139 mmol/L (ref 135–145)
Total Bilirubin: 0.8 mg/dL (ref 0.3–1.2)
Total Protein: 6.9 g/dL (ref 6.5–8.1)

## 2017-03-11 LAB — CBC WITH DIFFERENTIAL/PLATELET
BASOS ABS: 0.1 10*3/uL (ref 0–0.1)
Basophils Relative: 1 %
Eosinophils Absolute: 0.3 10*3/uL (ref 0–0.7)
Eosinophils Relative: 2 %
HCT: 47.3 % (ref 40.0–52.0)
HEMOGLOBIN: 15.8 g/dL (ref 13.0–18.0)
LYMPHS ABS: 3.1 10*3/uL (ref 1.0–3.6)
LYMPHS PCT: 25 %
MCH: 31.6 pg (ref 26.0–34.0)
MCHC: 33.5 g/dL (ref 32.0–36.0)
MCV: 94.4 fL (ref 80.0–100.0)
Monocytes Absolute: 1.1 10*3/uL — ABNORMAL HIGH (ref 0.2–1.0)
Monocytes Relative: 8 %
NEUTROS ABS: 8 10*3/uL — AB (ref 1.4–6.5)
NEUTROS PCT: 64 %
Platelets: 264 10*3/uL (ref 150–440)
RBC: 5.01 MIL/uL (ref 4.40–5.90)
RDW: 13.3 % (ref 11.5–14.5)
WBC: 12.6 10*3/uL — AB (ref 3.8–10.6)

## 2017-03-11 LAB — LACTATE DEHYDROGENASE: LDH: 132 U/L (ref 98–192)

## 2017-03-11 NOTE — Progress Notes (Signed)
Lobular myeloproliferative is able to be treated.[place] OFFICE PROGRESS NOTE  Patient Care Team: Dion Body, MD as PCP - General (Family Medicine)  CA of prostate [facility])   Staging form: Prostate, AJCC 7th Edition     Clinical: No stage assigned - Unsigned    Oncology History   thrombocytopenia, transient neutropenia enlarged spleen for several years.patient was being followed at [facility]. 2.Bone marrow biopsy (October, 2012)  No diagnostic features suggestive of myeloproliferative disease.   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   Denies any unusual fevers. Denies any night sweats. Denies any new lumps or bumps.  REVIEW OF SYSTEMS:  A complete 10 point review of system is done which is negative except mentioned above/history of present illness.   PAST MEDICAL HISTORY :  Past Medical History:  Diagnosis Date  . History of radiation therapy 11/03/13- 12/19/13   prostate bed 6600 cGy 33 sessions  . 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 Prostate cancer [facility]) 2003   s/p radical  prostatectomy  . Thrombocytopenia ([place])     PAST SURGICAL HISTORY :   Past Surgical History:  Procedure Laterality Date  . RETROPUBIC PROSTATECTOMY  03/02/2001   Gleason 7  . SPLENECTOMY  04/2012   [facility]    FAMILY HISTORY :   Family History  Problem Relation Age of Onset  . Cancer Father        prostate  . Alzheimer's disease Mother     SOCIAL HISTORY:   Social History   Tobacco Use  . Smoking status: Never Smoker  . Smokeless tobacco: Never Used  Substance Use Topics  . Alcohol use: No  . Drug use: No    ALLERGIES:  is allergic to niacin and oxycontin [oxycodone hcl].  MEDICATIONS:  Current Outpatient Medications  Medication Sig Dispense Refill  . acetaminophen (TYLENOL) 500 MG tablet Take 500 mg by mouth every 6 (six) hours as needed.    Marland Kitchen aspirin 81 MG chewable tablet Chew 81 mg by mouth.    . B Complex Vitamins (VITAMIN-B COMPLEX) TABS Take by mouth.    . carbidopa-levodopa (SINEMET IR) 25-100 MG tablet Take 2 tablets by mouth 4 (four) times daily.     . Cholecalciferol (VITAMIN D3) 10000 UNITS TABS Take by mouth.    . gabapentin (NEURONTIN) 300 MG capsule Take by mouth.    Marland Kitchen lisinopril (PRINIVIL,ZESTRIL) 10 MG tablet Take by mouth.    . Multiple Vitamin (MULTI-VITAMINS) TABS Take 1 tablet by mouth.    . omega-3 acid ethyl esters (LOVAZA) 1 g capsule Take by mouth 2 (  two) times daily.    . sildenafil (VIAGRA) 100 MG tablet Take 100 mg by mouth.     No current facility-administered medications for this visit.     PHYSICAL EXAMINATION: ECOG PERFORMANCE STATUS: 0 - Asymptomatic  BP 126/87 (BP Location: Left Arm, Patient Position: Sitting)   Pulse 80   Temp 97.8 F (36.6 C) (Tympanic)   Resp 16   Wt 227 lb (103 kg)   BMI 33.52 kg/m   Filed Weights   03/11/17 1106  Weight: 227 lb (103 kg)    GENERAL: Well-nourished well-developed; Alert, no distress and comfortable.   He is alone. EYES: no pallor or icterus OROPHARYNX: no thrush or  ulceration; good dentition  NECK: supple, NO cervical LN LYMPH:  no palpable lymphadenopathy axillary or inguinal regions LUNGS: clear to auscultation and  No wheeze or crackles HEART/CVS: regular rate & rhythm and no murmurs; No lower extremity edema ABDOMEN:abdomen soft, non-tender and normal bowel sounds Musculoskeletal:no cyanosis of digits and no clubbing  PSYCH: alert & oriented x 3 with fluent speech NEURO: no focal motor/sensory deficits SKIN:  no rashes or significant lesions  LABORATORY DATA:  I have reviewed the data as listed    Component Value Date/Time   NA 139 03/11/2017 1030   NA 143 01/25/2014 1128   K 4.8 03/11/2017 1030   K 4.3 01/25/2014 1128   CL 102 03/11/2017 1030   CL 106 01/25/2014 1128   CO2 27 03/11/2017 1030   CO2 29 01/25/2014 1128   GLUCOSE 119 (H) 03/11/2017 1030   GLUCOSE 111 (H) 01/25/2014 1128   BUN 22 (H) 03/11/2017 1030   BUN 14 01/25/2014 1128   CREATININE 1.01 03/11/2017 1030   CREATININE 1.03 01/25/2014 1128   CALCIUM 9.6 03/11/2017 1030   CALCIUM 9.5 01/25/2014 1128   PROT 6.9 03/11/2017 1030   PROT 6.8 01/25/2014 1128   ALBUMIN 4.4 03/11/2017 1030   ALBUMIN 4.1 01/25/2014 1128   AST 21 03/11/2017 1030   AST 22 01/25/2014 1128   ALT 5 (L) 03/11/2017 1030   ALT 43 01/25/2014 1128   ALKPHOS 87 03/11/2017 1030   ALKPHOS 114 01/25/2014 1128   BILITOT 0.8 03/11/2017 1030   BILITOT 0.7 01/25/2014 1128   GFRNONAA >60 03/11/2017 1030   GFRNONAA >60 01/25/2014 1128   GFRNONAA >60 07/13/2013 0951   GFRAA >60 03/11/2017 1030   GFRAA >60 01/25/2014 1128   GFRAA >60 07/13/2013 0951    No results found for: SPEP, UPEP  Lab Results  Component Value Date   WBC 12.6 (H) 03/11/2017   NEUTROABS 8.0 (H) 03/11/2017   HGB 15.8 03/11/2017   HCT 47.3 03/11/2017   MCV 94.4 03/11/2017   PLT 264 03/11/2017      Chemistry      Component Value Date/Time   NA 139 03/11/2017 1030   NA 143 01/25/2014 1128   K 4.8 03/11/2017 1030   K 4.3  01/25/2014 1128   CL 102 03/11/2017 1030   CL 106 01/25/2014 1128   CO2 27 03/11/2017 1030   CO2 29 01/25/2014 1128   BUN 22 (H) 03/11/2017 1030   BUN 14 01/25/2014 1128   CREATININE 1.01 03/11/2017 1030   CREATININE 1.03 01/25/2014 1128      Component Value Date/Time   CALCIUM 9.6 03/11/2017 1030   CALCIUM 9.5 01/25/2014 1128   ALKPHOS 87 03/11/2017 1030   ALKPHOS 114 01/25/2014 1128   AST 21 03/11/2017 1030   AST 22 01/25/2014 1128  ALT 5 (L) 03/11/2017 1030   ALT 43 01/25/2014 1128   BILITOT 0.8 03/11/2017 1030   BILITOT 0.7 01/25/2014 1128     IMPRESSION: 1. No new or suspicious areas of hypermetabolism on today's study. The activity identified in the right parotid gland and right submandibular lymph node on the prior study has resolved in the interval. 2. Focal hypermetabolism identified within a portal caval lymph node on the prior study has resolved. 3. Coronary artery and abdominal aortic atherosclerosis. 4. Nonobstructing right renal stone.   Electronically Signed   By: Misty Stanley M.D.   On: 08/28/2016 12:31  RADIOGRAPHIC STUDIES: I have personally reviewed the radiological images as listed and agreed with the findings in the report. No results found.   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 Continue monitor for now. CBC within normal limits. PET scan July 2018- no evidence of any recurrence.   # Prostate cancer- with recurrence in 2014 in the prostate bed status post radiation. Currently being followed by PSA by his urologist; Dr.Davis [[facility]; appt 28th].   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   # folo wup in 6 months/labs no scans.      No orders of the defined types were placed in this encounter.  All questions were answered. The patient knows to call the clinic with any problems, questions or concerns.      Chad Sickle, MD 03/24/2017 7:50 PM

## 2017-03-11 NOTE — Assessment & Plan Note (Addendum)
Status post splenectomy;  clinically no evidence of recurrence. Continue monitor for now. CBC within normal limits. PET scan July 2018- no evidence of any recurrence.   # Prostate cancer- with recurrence in 2014 in the prostate bed status post radiation. Currently being followed by PSA by his urologist; Dr.Davis [[facility]; appt 28th].   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   # folo wup in 6 months/labs no scans.

## 2017-08-29 IMAGING — CT NM PET TUM IMG RESTAG (PS) SKULL BASE T - THIGH
10 series · 25 of 25 positions shown · non-contrast
Comparison: 07/11/2013.  The brain MR of 01/03/2015.  Next

CLINICAL DATA: Subsequent treatment strategy for restaging of
lymphoma.. Right parotid nodes on prior brain MR.

EXAM:
NUCLEAR MEDICINE PET SKULL BASE TO THIGH
TECHNIQUE: 12.2 mCi F-18 FDG was injected intravenously. Full-ring PET imaging
was performed from the skull base to thigh after the radiotracer. CT
data was obtained and used for attenuation correction and anatomic
localization.
FASTING BLOOD GLUCOSE:  Value: 106 mg/dl

[Series 3: ct wb 5.0 b30f · axial · 5.0mm · 0.98mm/px · z∈[-1545,-561]mm · 3 of 329 slices shown]
[im 1/329]
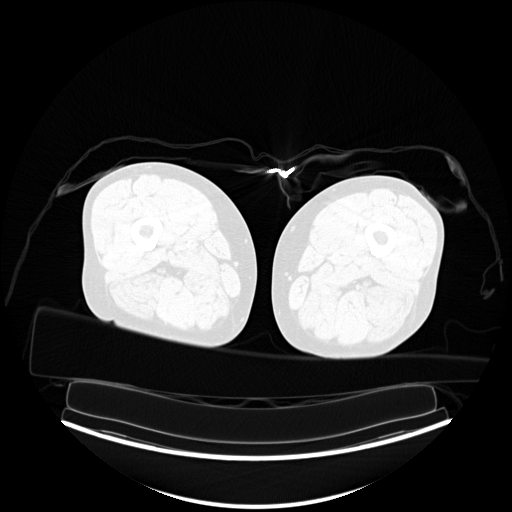
[im 165/329]
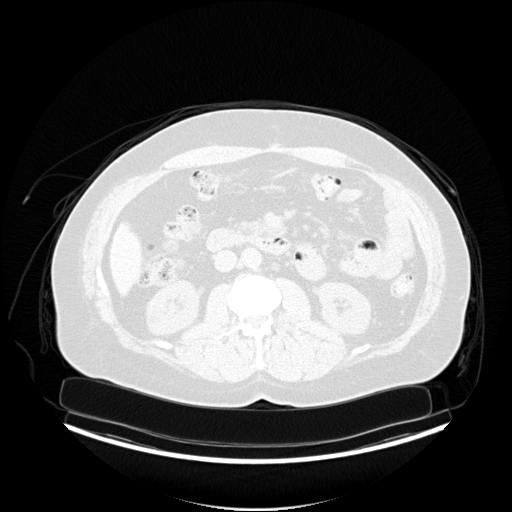
[im 329/329  brain]
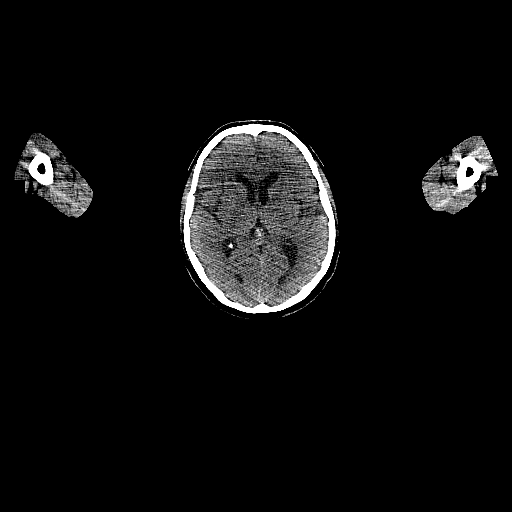

[Series 6: pet wb (ac) · axial · 5.0mm · 4.07mm/px · z∈[-1545,-561]mm · 3 of 329 slices shown]
[im 1/329]
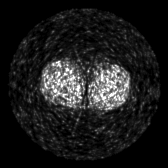
[im 165/329]
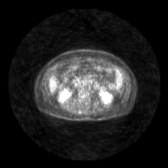
[im 329/329]
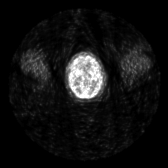

[Series 7: pet wb uncorrected (nac) · axial · 5.0mm · 4.07mm/px · z∈[-1545,-561]mm · 4 of 329 slices shown]
[im 1/329]
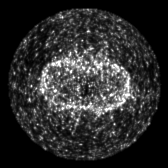
[im 110/329]
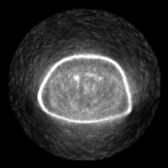
[im 219/329]
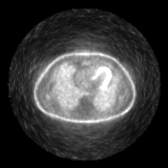
[im 329/329]
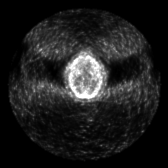

[Series 605: pet coronal · 1 of 83 slices shown]
[im 1/83]
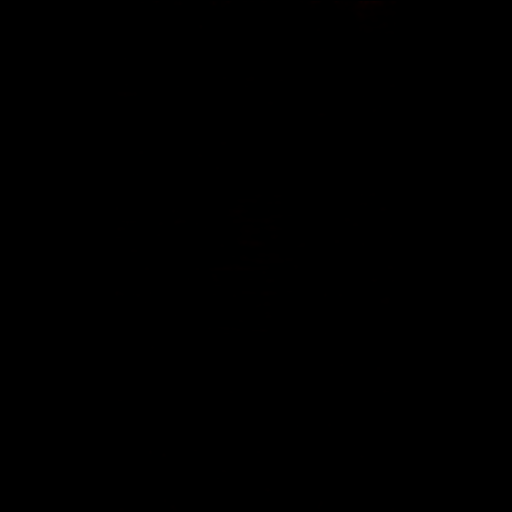

[Series 606: pet sagittal · 2 of 158 slices shown]
[im 1/158]
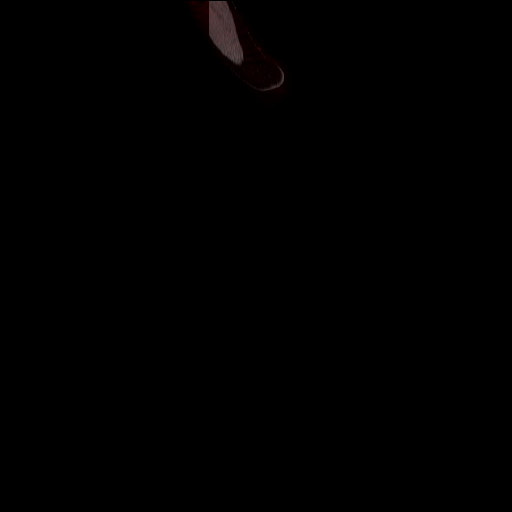
[im 158/158]
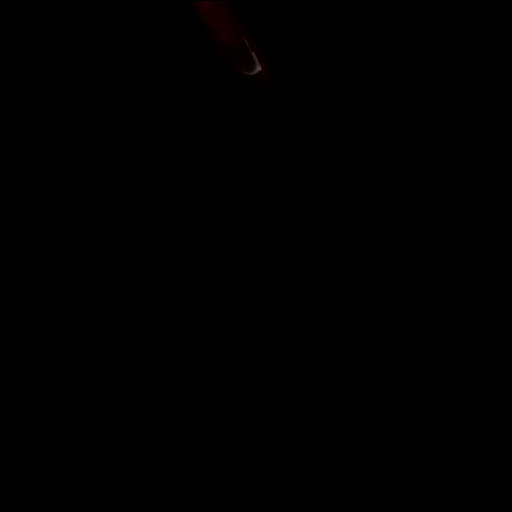

[Series 607: pet axial 1 · 4 of 327 slices shown]
[im 1/327]
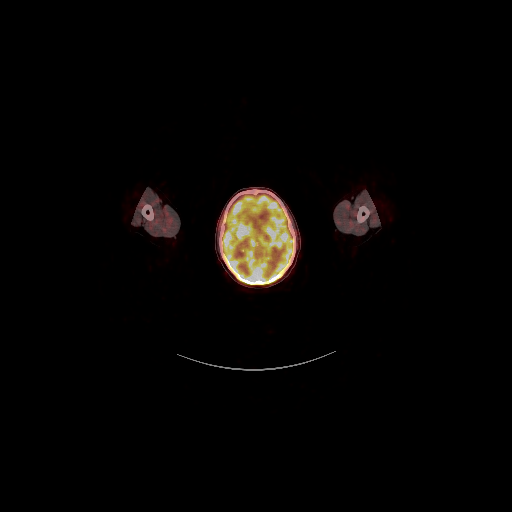
[im 109/327]
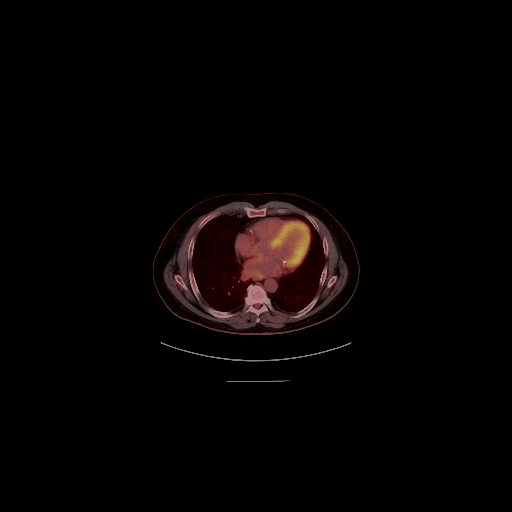
[im 218/327]
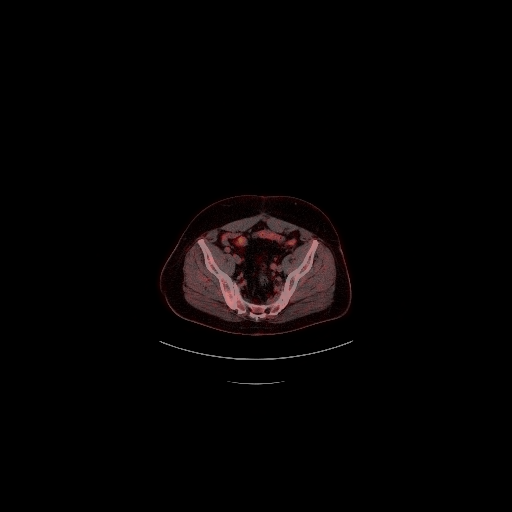
[im 327/327]
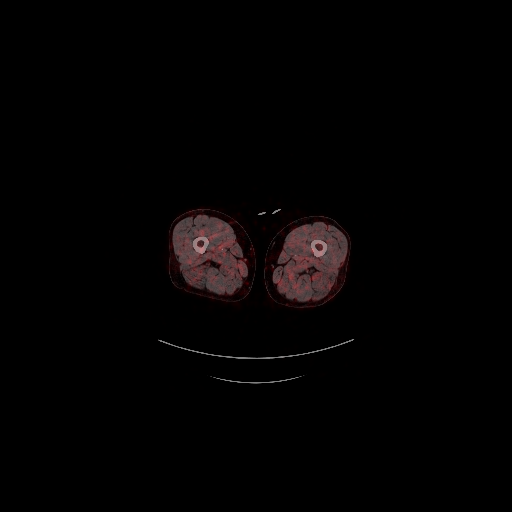

[Series 608: axial pet 2 · 4 of 326 slices shown]
[im 1/326]
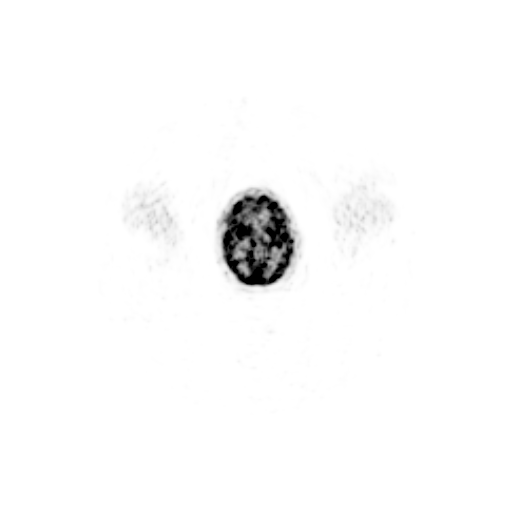
[im 109/326]
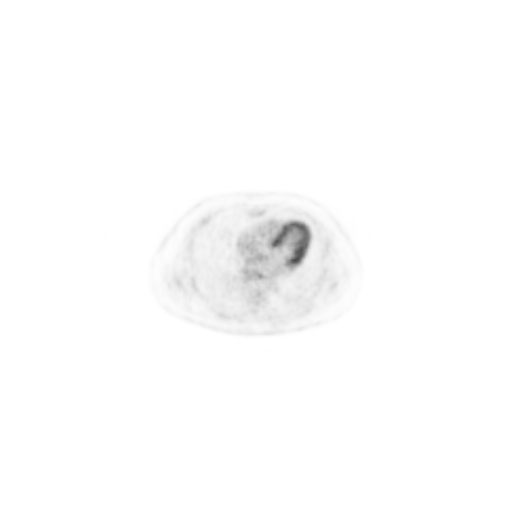
[im 217/326]
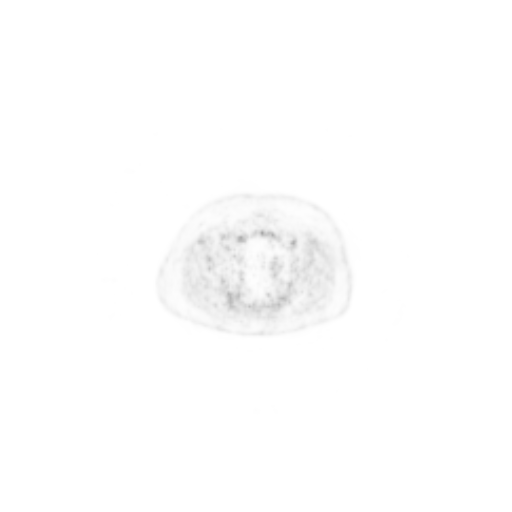
[im 326/326]
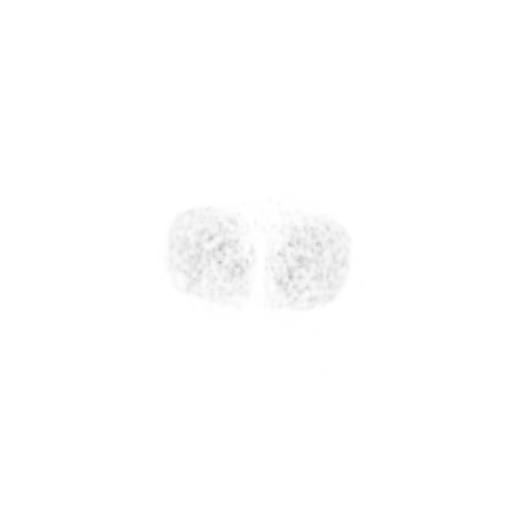

[Series 609: pet coronal 2 · 1 of 105 slices shown]
[im 1/105]
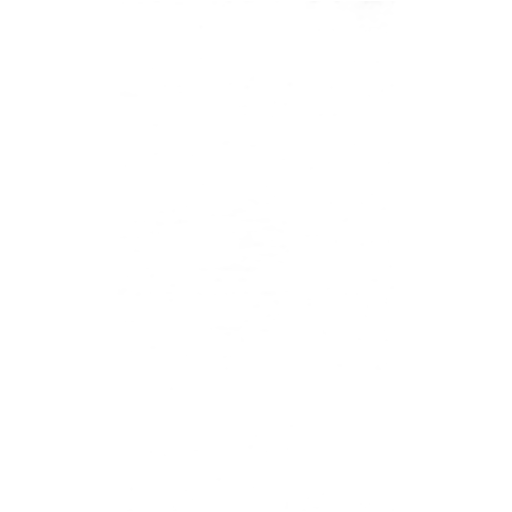

[Series 610: pet sagittal 2 · 2 of 140 slices shown]
[im 1/140]
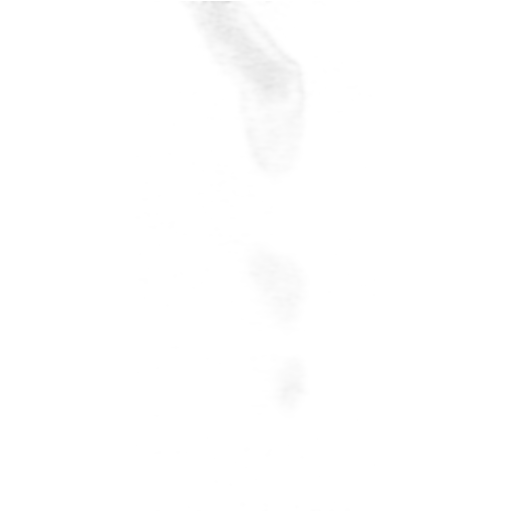
[im 140/140]
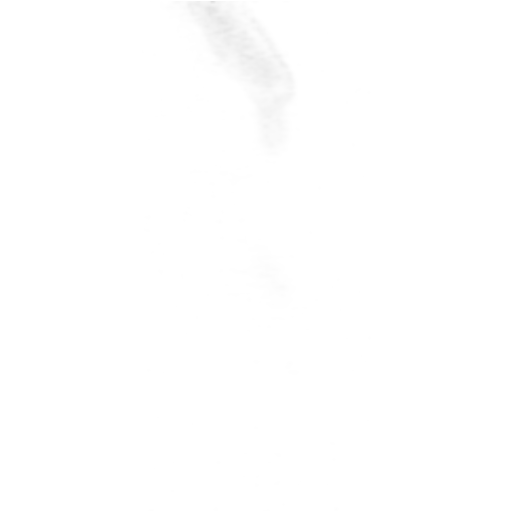

[Series 1044: results mm oncology reading · 3.0mm · 1.03mm/px · 1 of 3 slices shown]
[im 1/3]
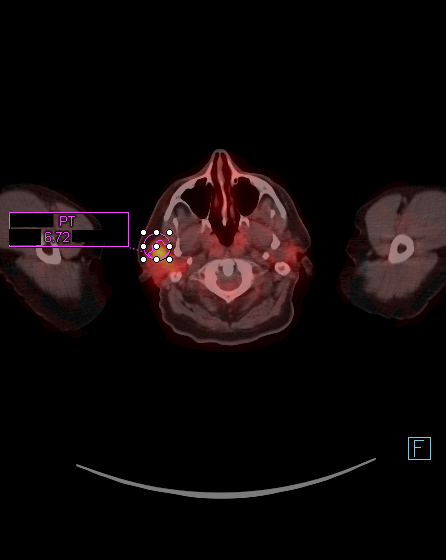

[25 of 25 positions shown; findings below may reference images not displayed]

FINDINGS: NECK

Hypermetabolism corresponding to the anterior right parotid
nodules/nodes. These measure up to 12 mm and a S.U.V. max of 6.7 on
image 24/series 3.

Adjacent right submandibular node measures 1.0 cm and a S.U.V. max
of 3.9 on image 43/series 3. This is newly hypermetabolic, and
enlarged from 7 mm on the prior exam.

CHEST

No areas of abnormal hypermetabolism.

ABDOMEN/PELVIS

A portal caval node measures 10 mm on image 150/series 3. This is
decreased from 13 mm on the prior exam. Hypermetabolism along its
right side could be nodal or physiologic and secondary to the
descending duodenum. This measures a S.U.V. max of 4.9.

Otherwise, no abdominal pelvic hypermetabolism.

SKELETON

No abnormal marrow activity.

CT IMAGES PERFORMED FOR ATTENUATION CORRECTION

No other cervical adenopathy. Left carotid atherosclerosis. Mild
cardiomegaly with multivessel coronary artery atherosclerosis. Right
hepatic lobe cyst. Splenic artery coiling. Splenectomy.
Prostatectomy.
IMPRESSION: 1. new hypermetabolic nodes within the anterior right parotid gland
with adjacent right submandibular station. Finding is suspicious for
recurrent lymphoma. Consider sampling.
2. Otherwise, no evidence of recurrent or residual hypermetabolic
lymphoma.
3. Focal hypermetabolism within a decreased size portacaval node is
favored to be physiologic and could be related to the adjacent
descending duodenum. Recommend attention on follow-up.

## 2017-09-09 ENCOUNTER — Inpatient Hospital Stay: Payer: Medicare Other | Attending: Internal Medicine

## 2017-09-09 ENCOUNTER — Inpatient Hospital Stay (HOSPITAL_BASED_OUTPATIENT_CLINIC_OR_DEPARTMENT_OTHER): Payer: Medicare Other | Admitting: Internal Medicine

## 2017-09-09 VITALS — BP 132/86 | HR 69 | Temp 97.1°F | Wt 226.0 lb

## 2017-09-09 DIAGNOSIS — Z8572 Personal history of non-Hodgkin lymphomas: Secondary | ICD-10-CM | POA: Diagnosis not present

## 2017-09-09 DIAGNOSIS — Z7982 Long term (current) use of aspirin: Secondary | ICD-10-CM | POA: Diagnosis not present

## 2017-09-09 DIAGNOSIS — Z9079 Acquired absence of other genital organ(s): Secondary | ICD-10-CM | POA: Diagnosis not present

## 2017-09-09 DIAGNOSIS — Z923 Personal history of irradiation: Secondary | ICD-10-CM | POA: Diagnosis not present

## 2017-09-09 DIAGNOSIS — Z9081 Acquired absence of spleen: Secondary | ICD-10-CM

## 2017-09-09 DIAGNOSIS — C8307 Small cell B-cell lymphoma, spleen: Secondary | ICD-10-CM

## 2017-09-09 DIAGNOSIS — Z8546 Personal history of malignant neoplasm of prostate: Secondary | ICD-10-CM | POA: Diagnosis not present

## 2017-09-09 DIAGNOSIS — I10 Essential (primary) hypertension: Secondary | ICD-10-CM | POA: Diagnosis not present

## 2017-09-09 DIAGNOSIS — G620 Drug-induced polyneuropathy: Secondary | ICD-10-CM

## 2017-09-09 DIAGNOSIS — Z79899 Other long term (current) drug therapy: Secondary | ICD-10-CM

## 2017-09-09 DIAGNOSIS — D696 Thrombocytopenia, unspecified: Secondary | ICD-10-CM | POA: Diagnosis not present

## 2017-09-09 DIAGNOSIS — Z8042 Family history of malignant neoplasm of prostate: Secondary | ICD-10-CM

## 2017-09-09 LAB — COMPREHENSIVE METABOLIC PANEL
ALK PHOS: 94 U/L (ref 38–126)
ALT: 5 U/L (ref 0–44)
ANION GAP: 6 (ref 5–15)
AST: 20 U/L (ref 15–41)
Albumin: 4.6 g/dL (ref 3.5–5.0)
BILIRUBIN TOTAL: 0.9 mg/dL (ref 0.3–1.2)
BUN: 25 mg/dL — ABNORMAL HIGH (ref 8–23)
CALCIUM: 9.5 mg/dL (ref 8.9–10.3)
CO2: 26 mmol/L (ref 22–32)
Chloride: 107 mmol/L (ref 98–111)
Creatinine, Ser: 1.11 mg/dL (ref 0.61–1.24)
Glucose, Bld: 117 mg/dL — ABNORMAL HIGH (ref 70–99)
POTASSIUM: 5 mmol/L (ref 3.5–5.1)
Sodium: 139 mmol/L (ref 135–145)
TOTAL PROTEIN: 7.1 g/dL (ref 6.5–8.1)

## 2017-09-09 LAB — CBC WITH DIFFERENTIAL/PLATELET
BASOS ABS: 0.1 10*3/uL (ref 0–0.1)
BASOS PCT: 1 %
Eosinophils Absolute: 0.3 10*3/uL (ref 0–0.7)
Eosinophils Relative: 3 %
HEMATOCRIT: 45.9 % (ref 40.0–52.0)
HEMOGLOBIN: 15.9 g/dL (ref 13.0–18.0)
LYMPHS PCT: 26 %
Lymphs Abs: 2.7 10*3/uL (ref 1.0–3.6)
MCH: 32.5 pg (ref 26.0–34.0)
MCHC: 34.7 g/dL (ref 32.0–36.0)
MCV: 93.6 fL (ref 80.0–100.0)
Monocytes Absolute: 0.9 10*3/uL (ref 0.2–1.0)
Monocytes Relative: 8 %
NEUTROS ABS: 6.4 10*3/uL (ref 1.4–6.5)
NEUTROS PCT: 62 %
Platelets: 267 10*3/uL (ref 150–440)
RBC: 4.9 MIL/uL (ref 4.40–5.90)
RDW: 13.4 % (ref 11.5–14.5)
WBC: 10.3 10*3/uL (ref 3.8–10.6)

## 2017-09-09 LAB — LACTATE DEHYDROGENASE: LDH: 125 U/L (ref 98–192)

## 2017-09-09 IMAGING — US US BIOPSY
1 series · 5 of 5 positions shown · non-contrast
Comparison: none

CLINICAL DATA: Right parotid mass

[Series 1: us biopsy · 0.07mm/px · 5 of 5 slices shown]
[im 1/5]
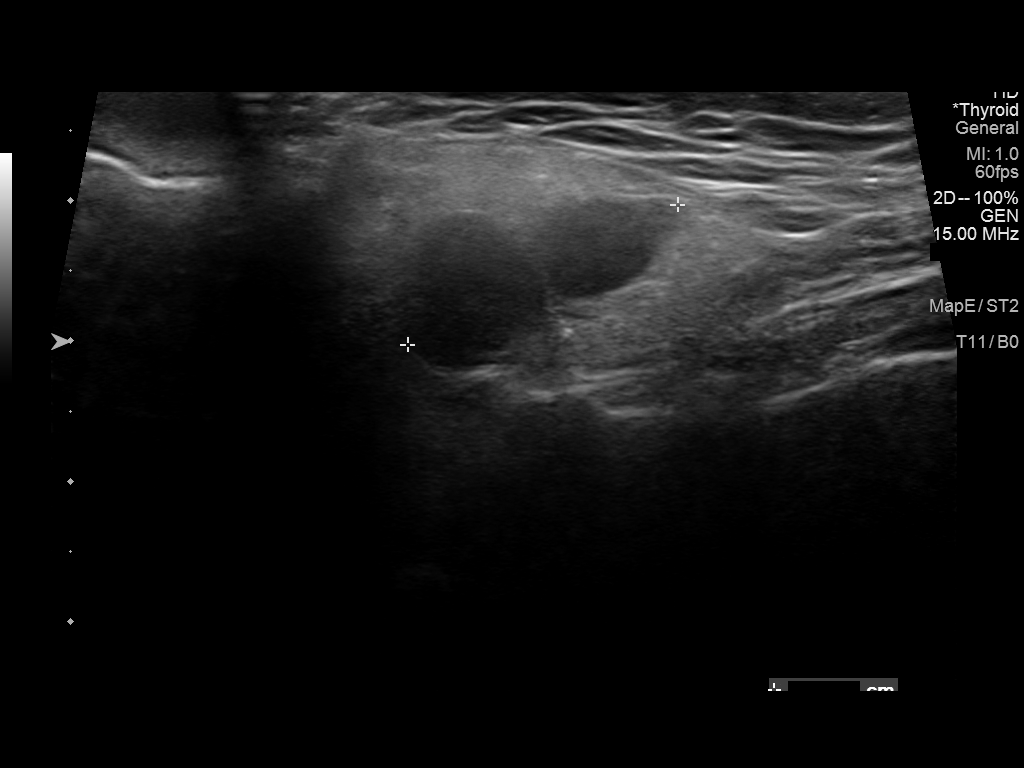
[im 2/5]
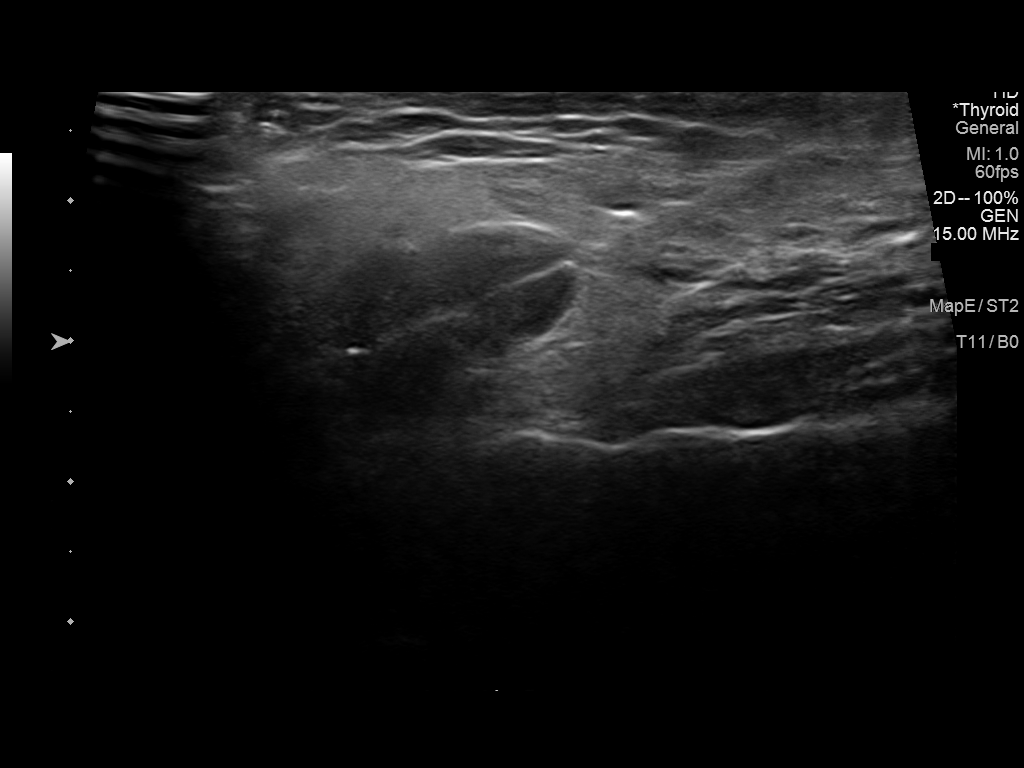
[im 3/5]
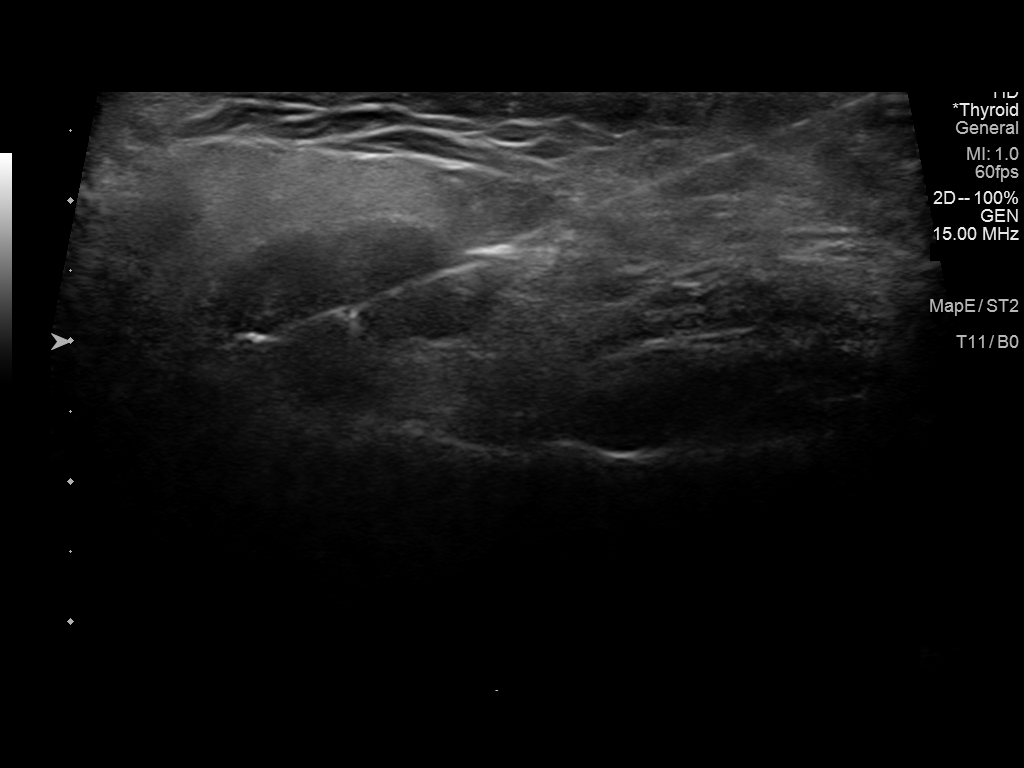
[im 4/5]
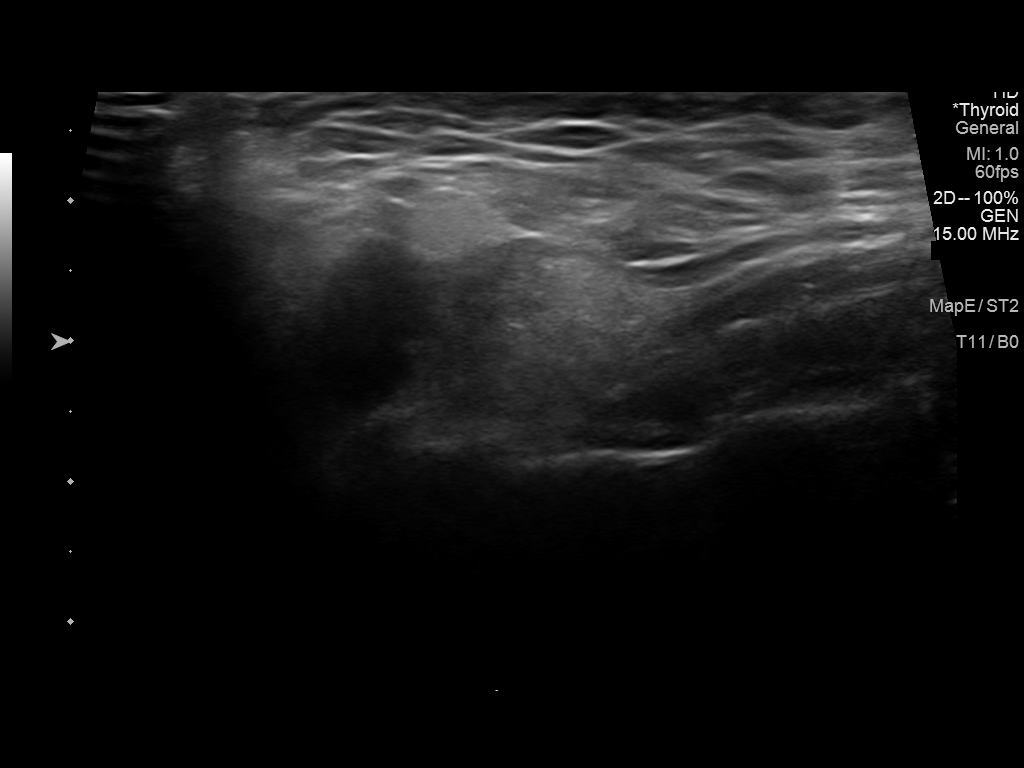
[im 5/5]
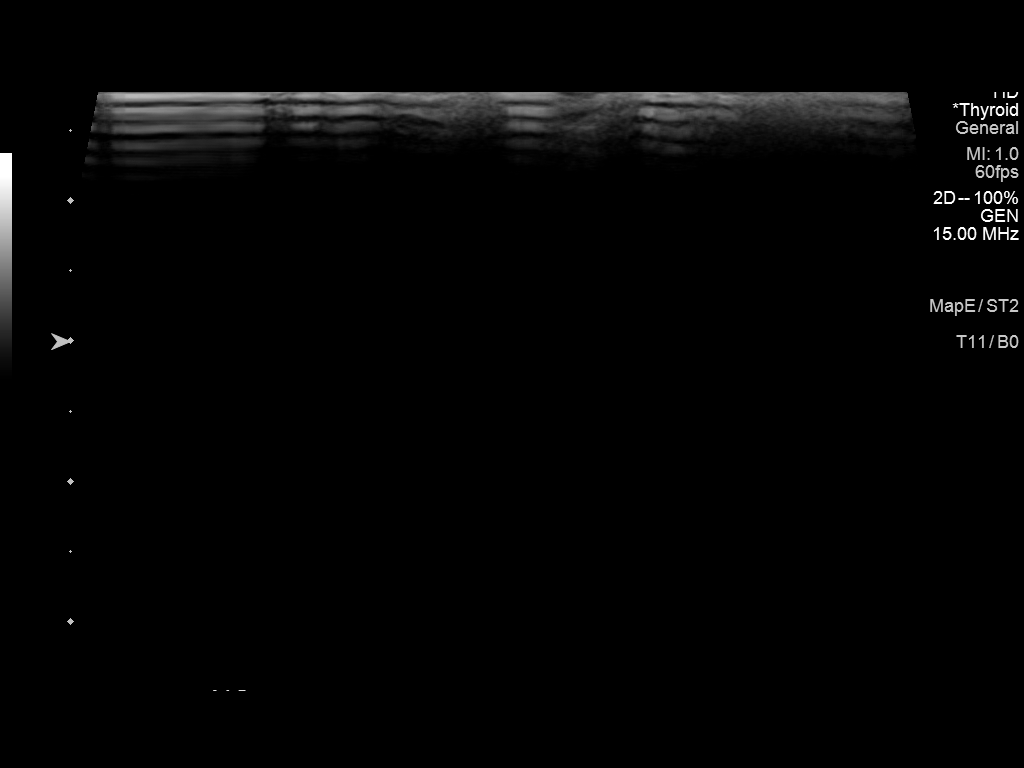

[5 of 5 positions shown; findings below may reference images not displayed]

EXAM:
ULTRASOUND GUIDED CORE BIOPSY OF RIGHT PAROTID GLAND

MEDICATIONS:
NONE

PROCEDURE:
The procedure, risks, benefits, and alternatives were explained to
the patient. Questions regarding the procedure were encouraged and
answered. The patient understands and consents to the procedure.

The right face was prepped with chlorhexidine in a sterile fashion,
and a sterile drape was applied covering the operative field. A
sterile gown and sterile gloves were used for the procedure. Local
anesthesia was provided with 1% Lidocaine.

Utilizing real-time ultrasound guidance, an 18 gauge core biopsy
needle was utilized to obtain multiple samples of the hypoechoic
lesion at the superior aspect of the right parotid gland. The lesion
does appear to lie within the gland on ultrasound imaging. Cores
were obtained for both side a pathology as well as flow cytometry.
The patient tolerated the procedure well was returned to his room in
satisfactory condition.

COMPLICATIONS:
None.
FINDINGS: Two small adjacent hypoechoic lesions are identified within the
superior aspect of the right parotid gland. They correspond to that
seen on recent MRI examination. One of these demonstrates a thin
area of increased echogenicity likely representing the fatty hilus.
These lesions were biopsied in tandem utilizing a 2 cm cutting
chamber needle.
IMPRESSION: Successful ultrasound-guided biopsy of right parotid lymph
node/mass.

## 2017-09-09 NOTE — Assessment & Plan Note (Addendum)
Status post splenectomy;  clinically no evidence of recurrence. CBC within normal limits. Clinically STABLE.  No evidence of recurrence.  # Prostate cancer- with recurrence in 2014 in the prostate bed status post radiation. Followed PSA by his urologist; Chad Sandoval; this month] stable.  # Pakinsons- STABLE.  Followed with Dr.Shah.   # folo wup in 12 months/labs no scans.

## 2017-09-09 NOTE — Progress Notes (Signed)
[place] OFFICE PROGRESS NOTE  Patient Care Team: Dion Body, MD as PCP - General (Family Medicine)  Cancer Staging CA of prostate [facility]) Staging form: Prostate, AJCC 7th Edition - Clinical: No stage assigned - Unsigned    Oncology History   thrombocytopenia, transient neutropenia enlarged spleen for several years.patient was being followed at [facility]. 2.Bone marrow biopsy (October, 2012)  No diagnostic features suggestive of myeloproliferative disease.   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  Appetite is good.  No weight loss.  Parkinson's disease stable.  Denies any bone pain.  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  Follows up with urology  Review of Systems  Constitutional: Negative for chills, diaphoresis, fever, malaise/fatigue and weight loss.  HENT: Negative for nosebleeds and sore throat.   Eyes: Negative for double vision.  Respiratory: Negative for cough, hemoptysis, sputum production, shortness of breath and wheezing.   Cardiovascular: Negative for chest pain, palpitations, orthopnea and leg swelling.   Gastrointestinal: Negative for abdominal pain, blood in stool, constipation, diarrhea, heartburn, melena, nausea and vomiting.  Genitourinary: Negative for dysuria, frequency and urgency.  Musculoskeletal: Negative for back pain and joint pain.  Skin: Negative.  Negative for itching and rash.  Neurological: Negative for dizziness, tingling, focal weakness, weakness and headaches.  Endo/Heme/Allergies: Does not bruise/bleed easily.  Psychiatric/Behavioral: Negative for depression. The patient is not nervous/anxious and does not have insomnia.       PAST MEDICAL HISTORY :  Past Medical History:  Diagnosis Date  . History of radiation therapy 11/03/13- 12/19/13   prostate bed 6600 cGy 33 sessions  . 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 Prostate cancer [facility]) 2003   s/p radical prostatectomy  . Thrombocytopenia ([place])     PAST SURGICAL HISTORY :   Past Surgical History:  Procedure Laterality Date  . RETROPUBIC PROSTATECTOMY  03/02/2001   Gleason 7  . SPLENECTOMY  04/2012   [facility]    FAMILY HISTORY :   Family History  Problem Relation Age of Onset  . Cancer Father        prostate  . Alzheimer's disease Mother     SOCIAL HISTORY:   Social History   Tobacco Use  . Smoking status: Never Smoker  . Smokeless tobacco: Never Used  Substance Use Topics  . Alcohol use: No  . Drug use: No    ALLERGIES:  is allergic to niacin and oxycontin [oxycodone hcl].  MEDICATIONS:  Current Outpatient Medications  Medication Sig Dispense Refill  . acetaminophen (TYLENOL) 500 MG tablet Take 500 mg by mouth every 6 (  six) hours as needed.    Marland Kitchen aspirin 81 MG chewable tablet Chew 81 mg by mouth.    . B Complex Vitamins (VITAMIN-B COMPLEX) TABS Take by mouth.    . carbidopa-levodopa (SINEMET CR) 50-200 MG tablet Take by mouth.    . carbidopa-levodopa (SINEMET IR) 25-100 MG tablet Take 2 tablets by mouth 4 (four) times daily.      . Cholecalciferol (VITAMIN D3) 10000 UNITS TABS Take by mouth.    . gabapentin (NEURONTIN) 300 MG capsule Take by mouth.    Marland Kitchen lisinopril (PRINIVIL,ZESTRIL) 10 MG tablet Take by mouth.    . Multiple Vitamin (MULTI-VITAMINS) TABS Take 1 tablet by mouth.    . omega-3 acid ethyl esters (LOVAZA) 1 g capsule Take by mouth 2 (two) times daily.    . sildenafil (VIAGRA) 100 MG tablet Take 100 mg by mouth.     No current facility-administered medications for this visit.     PHYSICAL EXAMINATION: ECOG PERFORMANCE STATUS: 0 - Asymptomatic  BP 132/86 (BP Location: Left Arm, Patient Position: Sitting)   Pulse 69   Temp (!) 97.1 F (36.2 C) (Tympanic)   Wt 226 lb (102.5 kg)   BMI 33.37 kg/m   Filed Weights   09/09/17 1130  Weight: 226 lb (102.5 kg)    GENERAL: Well-nourished well-developed; Alert, no distress and comfortable.  Accompanied by family.  EYES: no pallor or icterus OROPHARYNX: no thrush or ulceration; NECK: supple; no lymph nodes felt. LYMPH:  no palpable lymphadenopathy in the axillary or inguinal regions LUNGS: Decreased breath sounds auscultation bilaterally. No wheeze or crackles HEART/CVS: regular rate & rhythm and no murmurs; No lower extremity edema ABDOMEN:abdomen soft, non-tender and normal bowel sounds. No hepatomegaly or splenomegaly.  Musculoskeletal:no cyanosis of digits and no clubbing  PSYCH: alert & oriented x 3 with fluent speech NEURO: no focal motor/sensory deficits SKIN:  no rashes or significant lesions    LABORATORY DATA:  I have reviewed the data as listed    Component Value Date/Time   NA 139 09/09/2017 1036   NA 143 01/25/2014 1128   K 5.0 09/09/2017 1036   K 4.3 01/25/2014 1128   CL 107 09/09/2017 1036   CL 106 01/25/2014 1128   CO2 26 09/09/2017 1036   CO2 29 01/25/2014 1128   GLUCOSE 117 (H) 09/09/2017 1036   GLUCOSE 111 (H) 01/25/2014 1128   BUN 25 (H) 09/09/2017 1036   BUN 14 01/25/2014 1128   CREATININE 1.11 09/09/2017 1036    CREATININE 1.03 01/25/2014 1128   CALCIUM 9.5 09/09/2017 1036   CALCIUM 9.5 01/25/2014 1128   PROT 7.1 09/09/2017 1036   PROT 6.8 01/25/2014 1128   ALBUMIN 4.6 09/09/2017 1036   ALBUMIN 4.1 01/25/2014 1128   AST 20 09/09/2017 1036   AST 22 01/25/2014 1128   ALT 5 09/09/2017 1036   ALT 43 01/25/2014 1128   ALKPHOS 94 09/09/2017 1036   ALKPHOS 114 01/25/2014 1128   BILITOT 0.9 09/09/2017 1036   BILITOT 0.7 01/25/2014 1128   GFRNONAA >60 09/09/2017 1036   GFRNONAA >60 01/25/2014 1128   GFRNONAA >60 07/13/2013 0951   GFRAA >60 09/09/2017 1036   GFRAA >60 01/25/2014 1128   GFRAA >60 07/13/2013 0951    No results found for: SPEP, UPEP  Lab Results  Component Value Date   WBC 10.3 09/09/2017   NEUTROABS 6.4 09/09/2017   HGB 15.9 09/09/2017   HCT 45.9 09/09/2017   MCV 93.6 09/09/2017   PLT 267 09/09/2017  Chemistry      Component Value Date/Time   NA 139 09/09/2017 1036   NA 143 01/25/2014 1128   K 5.0 09/09/2017 1036   K 4.3 01/25/2014 1128   CL 107 09/09/2017 1036   CL 106 01/25/2014 1128   CO2 26 09/09/2017 1036   CO2 29 01/25/2014 1128   BUN 25 (H) 09/09/2017 1036   BUN 14 01/25/2014 1128   CREATININE 1.11 09/09/2017 1036   CREATININE 1.03 01/25/2014 1128      Component Value Date/Time   CALCIUM 9.5 09/09/2017 1036   CALCIUM 9.5 01/25/2014 1128   ALKPHOS 94 09/09/2017 1036   ALKPHOS 114 01/25/2014 1128   AST 20 09/09/2017 1036   AST 22 01/25/2014 1128   ALT 5 09/09/2017 1036   ALT 43 01/25/2014 1128   BILITOT 0.9 09/09/2017 1036   BILITOT 0.7 01/25/2014 1128       RADIOGRAPHIC STUDIES: I have personally reviewed the radiological images as listed and agreed with the findings in the report. No results found.   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 CBC within normal limits. Clinically STABLE.  No evidence of recurrence.  # Prostate cancer- with recurrence in 2014  in the prostate bed status post radiation. Followed PSA by his urologist; Flossie Dibble; this month] stable.  # Pakinsons- STABLE.  Followed with Dr.Shah.   # folo wup in 12 months/labs no scans.    Orders Placed This Encounter  Procedures  . CBC with Differential    Standing Status:   Future    Number of Occurrences:   1    Standing Expiration Date:   09/10/2018  . Comprehensive metabolic panel    Standing Status:   Future    Number of Occurrences:   1    Standing Expiration Date:   09/10/2018  . Lactate dehydrogenase    Standing Status:   Future    Number of Occurrences:   1    Standing Expiration Date:   09/10/2018  . CBC with Differential/Platelet    Standing Status:   Future    Standing Expiration Date:   09/10/2018  . Comprehensive metabolic panel    Standing Status:   Future    Standing Expiration Date:   09/10/2018  . Lactate dehydrogenase    Standing Status:   Future    Standing Expiration Date:   09/10/2018   All questions were answered. The patient knows to call the clinic with any problems, questions or concerns.      Cammie Sickle, MD 09/09/2017 12:17 PM

## 2017-10-29 DIAGNOSIS — G20 Parkinson's disease: Secondary | ICD-10-CM | POA: Insufficient documentation

## 2017-10-29 DIAGNOSIS — G20A1 Parkinson's disease without dyskinesia, without mention of fluctuations: Secondary | ICD-10-CM | POA: Insufficient documentation

## 2017-10-30 ENCOUNTER — Other Ambulatory Visit: Payer: Self-pay

## 2017-10-30 ENCOUNTER — Emergency Department
Admission: EM | Admit: 2017-10-30 | Discharge: 2017-10-31 | Disposition: A | Discharge status: Home / Self Care | Payer: Medicare Other | Attending: Emergency Medicine | Admitting: Emergency Medicine

## 2017-10-30 DIAGNOSIS — R339 Retention of urine, unspecified: Secondary | ICD-10-CM

## 2017-10-30 DIAGNOSIS — E119 Type 2 diabetes mellitus without complications: Secondary | ICD-10-CM | POA: Insufficient documentation

## 2017-10-30 DIAGNOSIS — I10 Essential (primary) hypertension: Secondary | ICD-10-CM | POA: Insufficient documentation

## 2017-10-30 DIAGNOSIS — Z79899 Other long term (current) drug therapy: Secondary | ICD-10-CM | POA: Insufficient documentation

## 2017-10-30 DIAGNOSIS — R338 Other retention of urine: Secondary | ICD-10-CM | POA: Diagnosis not present

## 2017-10-30 DIAGNOSIS — R310 Gross hematuria: Secondary | ICD-10-CM

## 2017-10-30 DIAGNOSIS — N3041 Irradiation cystitis with hematuria: Secondary | ICD-10-CM | POA: Diagnosis not present

## 2017-10-30 DIAGNOSIS — N35914 Unspecified anterior urethral stricture, male: Secondary | ICD-10-CM | POA: Diagnosis not present

## 2017-10-30 DIAGNOSIS — N99110 Postprocedural urethral stricture, male, meatal: Secondary | ICD-10-CM | POA: Diagnosis not present

## 2017-10-30 LAB — CBC
HEMATOCRIT: 41 % (ref 40.0–52.0)
Hemoglobin: 14.1 g/dL (ref 13.0–18.0)
MCH: 32.2 pg (ref 26.0–34.0)
MCHC: 34.4 g/dL (ref 32.0–36.0)
MCV: 93.5 fL (ref 80.0–100.0)
Platelets: 254 10*3/uL (ref 150–440)
RBC: 4.38 MIL/uL — AB (ref 4.40–5.90)
RDW: 13.2 % (ref 11.5–14.5)
WBC: 22.3 10*3/uL — ABNORMAL HIGH (ref 3.8–10.6)

## 2017-10-30 LAB — URINALYSIS, COMPLETE (UACMP) WITH MICROSCOPIC
BACTERIA UA: NONE SEEN
SQUAMOUS EPITHELIAL / LPF: NONE SEEN (ref 0–5)
Specific Gravity, Urine: 1.025 (ref 1.005–1.030)
WBC, UA: >50 WBC/hpf — ABNORMAL HIGH (ref 0–5)

## 2017-10-30 LAB — DIFFERENTIAL
BASOS ABS: 0.2 10*3/uL — AB (ref 0–0.1)
Basophils Relative: 1 %
EOS ABS: 0.1 10*3/uL (ref 0–0.7)
Eosinophils Relative: 1 %
LYMPHS PCT: 11 %
Lymphs Abs: 2.5 10*3/uL (ref 1.0–3.6)
MONOS PCT: 8 %
Monocytes Absolute: 1.7 10*3/uL — ABNORMAL HIGH (ref 0.2–1.0)
NEUTROS ABS: 18.1 10*3/uL — AB (ref 1.4–6.5)
NEUTROS PCT: 79 %

## 2017-10-30 LAB — BASIC METABOLIC PANEL
Anion gap: 6 (ref 5–15)
BUN: 24 mg/dL — ABNORMAL HIGH (ref 8–23)
CHLORIDE: 105 mmol/L (ref 98–111)
CO2: 27 mmol/L (ref 22–32)
Calcium: 9.1 mg/dL (ref 8.9–10.3)
Creatinine, Ser: 1.08 mg/dL (ref 0.61–1.24)
GFR calc non Af Amer: >60 mL/min (ref >60)
Glucose, Bld: 134 mg/dL — ABNORMAL HIGH (ref 70–99)
POTASSIUM: 4.3 mmol/L (ref 3.5–5.1)
SODIUM: 138 mmol/L (ref 135–145)

## 2017-10-30 MED ORDER — FENTANYL CITRATE (PF) 100 MCG/2ML IJ SOLN
25.0000 ug | Freq: Once | INTRAMUSCULAR | Status: AC
  Administered 2017-10-30: 25 ug via INTRAVENOUS

## 2017-10-30 MED ORDER — LIDOCAINE HCL URETHRAL/MUCOSAL 2 % EX GEL
CUTANEOUS | Status: AC
  Filled 2017-10-30: qty 10

## 2017-10-30 MED ORDER — LIDOCAINE HCL URETHRAL/MUCOSAL 2 % EX GEL
1.0000 "application " | Freq: Once | CUTANEOUS | Status: AC
  Administered 2017-10-30: 1 via URETHRAL

## 2017-10-30 MED ORDER — FENTANYL CITRATE (PF) 100 MCG/2ML IJ SOLN
INTRAMUSCULAR | Status: AC
  Filled 2017-10-30: qty 2

## 2017-10-30 NOTE — ED Notes (Signed)
Attempted to perform bladder scan and placement of indwelling catheter. Unable to get a urine reading after using 2 different bladder scanners. [place]ed to place [place] advance more than a couple inches before significant resistance was met. Charge RN, MSelinda Eon notified and pt taken immediately to Rm 3 for further treatment.

## 2017-10-30 NOTE — ED Triage Notes (Signed)
Pt arrives to ED via POV from home with c/o urinary retention with h/x of same. Pt reports prostate removal in 2003, is currently followed by a urologist at [facility]. No CP, [facility], N/V/D. Pt states last time he voided was "sometime after lunch, around 1pm maybe".

## 2017-10-31 DIAGNOSIS — R310 Gross hematuria: Secondary | ICD-10-CM

## 2017-10-31 DIAGNOSIS — N3041 Irradiation cystitis with hematuria: Secondary | ICD-10-CM

## 2017-10-31 DIAGNOSIS — R338 Other retention of urine: Secondary | ICD-10-CM

## 2017-10-31 DIAGNOSIS — N99110 Postprocedural urethral stricture, male, meatal: Secondary | ICD-10-CM

## 2017-10-31 LAB — LACTIC ACID, PLASMA: LACTIC ACID, VENOUS: 0.7 mmol/L (ref 0.5–1.9)

## 2017-10-31 MED ORDER — LIDOCAINE HCL URETHRAL/MUCOSAL 2 % EX GEL
CUTANEOUS | Status: AC
  Administered 2017-10-31: 1 via URETHRAL
  Filled 2017-10-31: qty 10

## 2017-10-31 MED ORDER — FENTANYL CITRATE (PF) 100 MCG/2ML IJ SOLN
25.0000 ug | Freq: Once | INTRAMUSCULAR | Status: AC
  Administered 2017-10-31: 25 ug via INTRAVENOUS
  Filled 2017-10-31: qty 2

## 2017-10-31 MED ORDER — FENTANYL CITRATE (PF) 100 MCG/2ML IJ SOLN
INTRAMUSCULAR | Status: AC
  Administered 2017-10-31: 25 ug via INTRAVENOUS
  Filled 2017-10-31: qty 2

## 2017-10-31 MED ORDER — LIDOCAINE HCL URETHRAL/MUCOSAL 2 % EX GEL
1.0000 "application " | Freq: Once | CUTANEOUS | Status: AC
  Administered 2017-10-31: 1 via URETHRAL

## 2017-10-31 MED ORDER — FENTANYL CITRATE (PF) 100 MCG/2ML IJ SOLN
25.0000 ug | Freq: Once | INTRAMUSCULAR | Status: AC
  Administered 2017-10-31: 25 ug via INTRAVENOUS

## 2017-10-31 MED ORDER — SODIUM CHLORIDE 0.9 % IV SOLN
1.0000 g | Freq: Once | INTRAVENOUS | Status: AC
  Administered 2017-10-31: 1 g via INTRAVENOUS
  Filled 2017-10-31: qty 10

## 2017-10-31 MED ORDER — CEPHALEXIN 500 MG PO CAPS: 500.0000 mg | ORAL_CAPSULE | Freq: Three times a day (TID) | ORAL | 0 refills | Status: DC

## 2017-10-31 NOTE — Consult Note (Signed)
H&P Physician requesting consult: Lurline Hare, MD  Chief Complaint: Gross hematuria  History of Present Illness: 72 year old male with history of prostate cancer status post radical retropubic prostatectomy in the remote past.  He had a biochemical recurrence status post radiation in the past as well.  He is followed by [facility] urology.  He has been having issues with intermittent gross hematuria for the past month.  He has had gross hematuria in the past requiring biopsy and fulguration of the bladder.  This was in 2018.  Apparently for the past month, he has had intermittent gross hematuria.  He also has a history of urethral stricture that has required DVIU in 2017.  Today, the patient was unable to intermittently cath secondary to clots.  He went into retention and presented to the emergency department where there were difficulties placing catheters and evacuating clot.  The patient complains about the suprapubic discomfort secondary to retention.  Hemoglobin is stable at 14.1.  White blood cell count is 22.3.  Past Medical History:  Diagnosis Date  . History of radiation therapy 11/03/13- 12/19/13   prostate bed 6600 cGy 33 sessions  . 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 Prostate cancer [facility]) 2003   s/p radical prostatectomy  . Thrombocytopenia ([place])    Past Surgical History:  Procedure Laterality Date  . RETROPUBIC PROSTATECTOMY  03/02/2001   Gleason 7  . 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 Niacin Other (See Comments)    PER PT-MADE HIM FEEL REALLY HOT ALL OVER BODY  . Oxycontin [Oxycodone Hcl] Other (See Comments)    "caused him to be really hot"    Family History  Problem Relation Age of Onset  . Cancer Father        prostate  . Alzheimer's disease Mother    Social History:  reports that he has never smoked.  He has never used smokeless tobacco. He reports that he does not drink alcohol or use drugs.  ROS: A complete review of systems was performed.  All systems are negative except for pertinent findings as noted. ROS   Physical Exam:  Vital signs in last 24 hours: Temp:  [97.9 F (36.6 C)-98.2 F (36.8 C)] 97.9 F (36.6 C) (09/07 0124) Pulse Rate:  [90-103] 90 (09/07 0200) Resp:  [18-20] 20 (09/07 0124) BP: (128-160)/(72-104) 128/72 (09/07 0200) SpO2:  [90 %-95 %] 90 % (09/07 0200) Weight:  [103.4 kg] 103.4 kg (09/06 2211) General:  Alert and oriented, No acute distress HEENT: Normocephalic, atraumatic Neck: No JVD or lymphadenopathy Cardiovascular: Regular rate and rhythm Lungs: Regular rate and effort Abdomen: Soft, tender over the bladder, nondistended, no abdominal masses Genitourinary: Circumcised phallus Back: No CVA tenderness Extremities: No edema Neurologic: Grossly intact  Laboratory Data:  Results for orders placed or performed during the hospital encounter of 10/30/17 (from the past 24 hour(s))  Urinalysis, Complete w Microscopic     Status: Abnormal   Collection Time: 10/30/17 11:02 PM  Result Value Ref Range   Color, Urine RED (A) YELLOW   APPearance CLOUDY (A) CLEAR   Specific Gravity, Urine 1.025 1.005 - 1.030   pH  5.0 - 8.0    TEST NOT REPORTED DUE TO COLOR INTERFERENCE OF URINE PIGMENT   Glucose, UA (A) NEGATIVE mg/dL    TEST NOT  REPORTED DUE TO COLOR INTERFERENCE OF URINE PIGMENT   Hgb urine dipstick (A) NEGATIVE    TEST NOT REPORTED DUE TO COLOR INTERFERENCE OF URINE PIGMENT   Bilirubin Urine (A) NEGATIVE    TEST NOT REPORTED DUE TO COLOR INTERFERENCE OF URINE PIGMENT   Ketones, ur (A) NEGATIVE mg/dL    TEST NOT REPORTED DUE TO COLOR INTERFERENCE OF URINE PIGMENT   Protein, ur (A) NEGATIVE mg/dL    TEST NOT REPORTED DUE TO COLOR INTERFERENCE OF URINE PIGMENT   Nitrite (A) NEGATIVE    TEST NOT REPORTED DUE TO COLOR INTERFERENCE OF URINE PIGMENT    Leukocytes, UA (A) NEGATIVE    TEST NOT REPORTED DUE TO COLOR INTERFERENCE OF URINE PIGMENT   RBC / HPF >50 (H) 0 - 5 RBC/hpf   WBC, UA >50 (H) 0 - 5 WBC/hpf   Bacteria, UA NONE SEEN NONE SEEN   Squamous Epithelial / LPF NONE SEEN 0 - 5  CBC     Status: Abnormal   Collection Time: 10/30/17 11:03 PM  Result Value Ref Range   WBC 22.3 (H) 3.8 - 10.6 K/uL   RBC 4.38 (L) 4.40 - 5.90 MIL/uL   Hemoglobin 14.1 13.0 - 18.0 g/dL   HCT 41.0 40.0 - 52.0 %   MCV 93.5 80.0 - 100.0 fL   MCH 32.2 26.0 - 34.0 pg   MCHC 34.4 32.0 - 36.0 g/dL   RDW 13.2 11.5 - 14.5 %   Platelets 254 150 - 440 K/uL  Basic metabolic panel     Status: Abnormal   Collection Time: 10/30/17 11:03 PM  Result Value Ref Range   Sodium 138 135 - 145 mmol/L   Potassium 4.3 3.5 - 5.1 mmol/L   Chloride 105 98 - 111 mmol/L   CO2 27 22 - 32 mmol/L   Glucose, Bld 134 (H) 70 - 99 mg/dL   BUN 24 (H) 8 - 23 mg/dL   Creatinine, Ser 1.08 0.61 - 1.24 mg/dL   Calcium 9.1 8.9 - 10.3 mg/dL   GFR calc non Af Amer >60 >60 mL/min   GFR calc Af Amer >60 >60 mL/min   Anion gap 6 5 - 15  Differential     Status: Abnormal   Collection Time: 10/30/17 11:03 PM  Result Value Ref Range   Neutrophils Relative % 79 %   Neutro Abs 18.1 (H) 1.4 - 6.5 K/uL   Lymphocytes Relative 11 %   Lymphs Abs 2.5 1.0 - 3.6 K/uL   Monocytes Relative 8 %   Monocytes Absolute 1.7 (H) 0.2 - 1.0 K/uL   Eosinophils Relative 1 %   Eosinophils Absolute 0.1 0 - 0.7 K/uL   Basophils Relative 1 %   Basophils Absolute 0.2 (H) 0 - 0.1 K/uL  Lactic acid, plasma     Status: None   Collection Time: 10/31/17 12:48 AM  Result Value Ref Range   Lactic Acid, Venous 0.7 0.5 - 1.9 mmol/L   No results found for this or any previous visit (from the past 240 hour(s)). Creatinine: Recent Labs    10/30/17 2303  CREATININE 1.08   Procedure: Cystoscopy with urethral dilation Bladder irrigation  Under sterile conditions, I attempted to place a 22 French catheter.  This met  resistance.  I therefore tried to advance a wire into the bladder but this met resistance as well.  Therefore assembled the flexible cystoscope and carefully advanced that into the urethra and into the bladder.  Hematuria obscured the view however  there was clearly some narrowing as well as irritation and edema from catheter placements in the urethra.  I could not visualize the bladder mucosa secondary to hematuria.  I passed a wire through the scope and into the bladder.  I then withdrew the scope.  I then sequentially dilated the urethra from 18-24 [place] with a mild amount of resistance with each pass consistent with stricture.  I then advanced a 12 [place] three-way hematuria catheter over the wire and into the bladder and remove the wire.  There was immediate return of some bloody urine.  I proceeded to irrigate the bladder with normal saline.  I irrigated about 800 cc.  I evacuated a moderate amount of clot until no other clot return.  I irrigated until irrigant was clear.  There is no evidence of significant active bleeding.  I secured the catheter to bag gravity drainage.  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  He will call his urologist on Monday for follow-up appointment.  He will keep his catheter in.  I recommended that he inquire and discuss the possibility of hyperbaric oxygen therapy for treatment of his radiation cystitis.  Marton Redwood, III 10/31/2017, 3:24 AM

## 2017-10-31 NOTE — Discharge Instructions (Addendum)
1.  Take antibiotic as prescribed (Keflex 500 mg 3 times daily x7 days). 2.  Foley catheter care as instructed. 3.  Leave catheter in until seen by your urologist. 4.  Return to the ER for worsening symptoms, persistent vomiting, fever, difficulty breathing or other concerns.

## 2017-10-31 NOTE — ED Provider Notes (Signed)
[facility] Emergency Department Provider Note   ____________________________________________   First MD Initiated Contact with Patient 10/31/17 0003     (approximate)  I have reviewed the triage vital signs and the nursing notes.   HISTORY  Chief Complaint Urinary Retention    HPI Chad Sandoval is a 72 y.o. male who presents to the ED from home with a chief complaint of urinary retention.  Patient has a history of prostate cancer status post radical retropubic prostatectomy many years ago and external beam therapy with chemotherapy in November 2015.  Sees urology at [facility].  Has been having urinary retention and hematuria secondary to radiation therapy.  Just saw his urologist yesterday who placed a 68 French Foley and irrigated with clear irrigant.  Foley was removed and patient encouraged to continue CIC.  Discontinued his aspirin therapy.  Patient reports multiple blood clots while urinating and retention since 1 PM.  Other than abdominal discomfort, denies fever, chills, chest pain, shortness of breath, nausea, vomiting, dizziness.  Denies recent travel or trauma.   Past Medical History:  Diagnosis Date  . History of radiation therapy 11/03/13- 12/19/13   prostate bed 6600 cGy 33 sessions  . 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 Prostate cancer [facility]) 2003   s/p radical prostatectomy  . Thrombocytopenia [facility])     Patient Active Problem List   Diagnosis Date Noted  . Essential (primary) hypertension 01/29/2015  . Class 1 obesity 01/29/2015  . H/O malignant neoplasm of prostate 12/15/2014  . 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 Diabetes mellitus, type 2 ([place]) 02/07/2014  . Type 2 diabetes mellitus ([place]) 02/07/2014  . Bladder neck obstruction 01/02/2014  . Bladder retention 01/02/2014  . Abnormal prostate specific antigen 09/29/2011  . Elevated prostate specific antigen  (PSA) 09/29/2011  . CA of prostate ([place]) 04/07/2011  . 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 Malignant neoplasm of prostate ([place]) 04/07/2011    Past Surgical History:  Procedure Laterality Date  . RETROPUBIC PROSTATECTOMY  03/02/2001   Gleason 7  . 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 Authorizing Provider  carbidopa-levodopa (SINEMET CR) 50-200 MG tablet Take 1 tablet by mouth at bedtime.  06/15/17 06/15/18 Yes [provider]  carbidopa-levodopa (SINEMET IR) 25-100 MG tablet Take 2 tablets by mouth 4 (four) times daily.    Yes [provider]  Cholecalciferol (VITAMIN D3) 2000 units TABS Take 2,000 Units by mouth daily.    Yes [provider]  gabapentin (NEURONTIN) 300 MG capsule Take 300 mg by mouth 2 (two) times daily.  01/21/17  Yes [provider]  lisinopril (PRINIVIL,ZESTRIL) 10 MG tablet Take 10 mg by mouth daily.  01/22/15  Yes [provider]  Multiple Vitamin (MULTI-VITAMINS) TABS Take 1 tablet by mouth daily.    Yes [provider]  omega-3 acid ethyl esters (LOVAZA) 1 g capsule Take 1 g by mouth 2 (two) times daily.    Yes [provider]  sildenafil (REVATIO) 20 MG tablet Take 1-5 tablets by mouth as needed (erectile dysfucntion).  09/14/17  Yes [provider]  sildenafil (VIAGRA) 100 MG tablet Take 100 mg by mouth daily as needed for erectile dysfunction.   Yes [provider]  cephALEXin (KEFLEX) 500 MG capsule Take 1 capsule (  500 mg total) by mouth 3 (three) times daily. 10/31/17   Paulette Blanch, MD    Allergies Niacin and Oxycontin [oxycodone hcl]  Family History  Problem Relation Age of Onset  . Cancer Father        prostate  . Alzheimer's disease Mother     Social History Social History   Tobacco Use  . Smoking status: Never Smoker  . Smokeless tobacco: Never Used  Substance  Use Topics  . Alcohol use: No  . Drug use: No    Review of Systems  Constitutional: No fever/chills Eyes: No visual changes. ENT: No sore throat. Cardiovascular: Denies chest pain. Respiratory: Denies shortness of breath. Gastrointestinal: Positive for suprapubic abdominal pain.  No nausea, no vomiting.  No diarrhea.  No constipation. Genitourinary: Positive for retention and hematuria.  Negative for dysuria. Musculoskeletal: Negative for back pain. Skin: Negative for rash. Neurological: Negative for headaches, focal weakness or numbness.   ____________________________________________   PHYSICAL EXAM:  VITAL SIGNS: ED Triage Vitals  Enc Vitals Group     BP 10/30/17 2213 (!) 160/104     Pulse Rate 10/30/17 2213 (!) 102     Resp 10/30/17 2213 18     Temp 10/30/17 2213 98.2 F (36.8 C)     Temp Source 10/30/17 2213 Oral     SpO2 10/30/17 2213 95 %     Weight 10/30/17 2211 228 lb (103.4 kg)     Height 10/30/17 2211 5\' 9"  (1.753 m)     Head Circumference --      Peak Flow --      Pain Score 10/30/17 2211 9     Pain Loc --      Pain Edu? --      Excl. in [place]? --    Patient examined during manual irrigation with 14 French Foley in place: Constitutional: Alert and oriented. Well appearing and in no acute distress. Eyes: Conjunctivae are normal. PERRL. EOMI. Head: Atraumatic. Nose: No congestion/rhinnorhea. Mouth/Throat: Mucous membranes are moist.  Oropharynx non-erythematous. Neck: No stridor.   Cardiovascular: Normal rate, regular rhythm. Grossly normal heart sounds.  Good peripheral circulation. Respiratory: Normal respiratory effort.  No retractions. Lungs CTAB. Gastrointestinal: Soft and nontender to light or deep palpation. No distention. No abdominal bruits. No CVA tenderness. Genitourinary: Gross hematuria noted with clots. Musculoskeletal: No lower extremity tenderness nor edema.  No joint effusions. Neurologic:  Normal speech and language. No gross focal  neurologic deficits are appreciated. No gait instability. Skin:  Skin is warm, dry and intact. No rash noted. Psychiatric: Mood and affect are normal. Speech and behavior are normal.  ____________________________________________   LABS (all labs ordered are listed, but only abnormal results are displayed)  Labs Reviewed  URINALYSIS, COMPLETE (UACMP) WITH MICROSCOPIC - Abnormal; Notable for the following components:      Result Value   Color, Urine RED (*)    APPearance CLOUDY (*)    Glucose, UA   (*)    Value: TEST NOT REPORTED DUE TO COLOR INTERFERENCE OF URINE PIGMENT   Hgb urine dipstick   (*)    Value: TEST NOT REPORTED DUE TO COLOR INTERFERENCE OF URINE PIGMENT   Bilirubin Urine   (*)    Value: TEST NOT REPORTED DUE TO COLOR INTERFERENCE OF URINE PIGMENT   Ketones, ur   (*)    Value: TEST NOT REPORTED DUE TO COLOR INTERFERENCE OF URINE PIGMENT   Protein, ur   (*)    Value: TEST NOT REPORTED  DUE TO COLOR INTERFERENCE OF URINE PIGMENT   Nitrite   (*)    Value: TEST NOT REPORTED DUE TO COLOR INTERFERENCE OF URINE PIGMENT   Leukocytes, UA   (*)    Value: TEST NOT REPORTED DUE TO COLOR INTERFERENCE OF URINE PIGMENT   RBC / HPF >50 (*)    WBC, UA >50 (*)    All other components within normal limits  CBC - Abnormal; Notable for the following components:   WBC 22.3 (*)    RBC 4.38 (*)    All other components within normal limits  BASIC METABOLIC PANEL - Abnormal; Notable for the following components:   Glucose, Bld 134 (*)    BUN 24 (*)    All other components within normal limits  DIFFERENTIAL - Abnormal; Notable for the following components:   Neutro Abs 18.1 (*)    Monocytes Absolute 1.7 (*)    Basophils Absolute 0.2 (*)    All other components within normal limits  URINE CULTURE  LACTIC ACID, PLASMA  LACTIC ACID, PLASMA   ____________________________________________  EKG  None ____________________________________________  RADIOLOGY  ED MD interpretation:  None  Official radiology report(s): No results found.  ____________________________________________   PROCEDURES  Procedure(s) performed: None  Procedures  Critical Care performed: No  ____________________________________________   INITIAL IMPRESSION / ASSESSMENT AND PLAN / ED COURSE  As part of my medical decision making, I reviewed the following data within the [place] History obtained from family, Nursing notes reviewed and incorporated, Labs reviewed, Old chart reviewed and Notes from prior ED visits   72 year old male with hematuria secondary to radiation therapy causing clot retention.  Differential diagnosis includes but is not limited to cystitis, clot retention, urethral stricture, bladder spasms, etc.  Laboratory and urinalysis results remarkable for leukocytosis which has doubled from the lab work done at his urologist office.  Unable to test urinalysis secondary to color interference.  Urine culture has been added.  We will continue manual irrigation until clear.  Check lactic acid.  Clinical Course as of Oct 31 432  Sat Oct 31, 2017  0147 ED summary: Multiple attempts at Foley placement.  33 French was able to be inserted with manual irrigation containing large amount of blood clots.  3 different nurses attempted placement of larger [place] Foley including 16, 18 and 22 [place].  91 French was ultimately placed, saline was able to be flushed but without return.  Repeat bladder scan by 2 staff members remains 0.  Discussed case with urologist on-call Dr. Gloriann Loan who will evaluate patient in the emergency department.   [JS]  [id number] Dr. Gloriann Loan evaluated patient at bedside.  See consult note on chart.  In short, he dilated patient's urethra and was able to insert a 22 [place] catheter.  Bladder has been irrigated to clear.  Recommends 1 dose IV ceftriaxone now and discharged home on Keflex.  Patient will follow-up with his urologist closely.  Strict return  precautions given.  Patient and spouse verbalize understanding and agree with plan of care.   [JS]    Clinical Course User Index [JS] Paulette Blanch, MD     ____________________________________________   FINAL CLINICAL IMPRESSION(S) / ED DIAGNOSES  Final diagnoses:  Urinary retention  Gross hematuria  Anterior urethral stricture     ED Discharge Orders         Ordered    cephALEXin (KEFLEX) 500 MG capsule  3 times daily     10/31/17 0433  Note:  This document was prepared using Dragon voice recognition software and may include unintentional dictation errors.    Paulette Blanch, MD 10/31/17 [phone]

## 2017-10-31 NOTE — ED Notes (Addendum)
Multiple attempts by 3 different RN's to insert foley catheter with no success. Urology contacted, they will come to see pt here in the ED.

## 2017-11-02 LAB — URINE CULTURE: Culture: >=100000 — AB

## 2017-11-19 DIAGNOSIS — N3040 Irradiation cystitis without hematuria: Secondary | ICD-10-CM | POA: Insufficient documentation

## 2017-11-19 DIAGNOSIS — R310 Gross hematuria: Secondary | ICD-10-CM | POA: Insufficient documentation

## 2017-12-08 ENCOUNTER — Encounter (HOSPITAL_BASED_OUTPATIENT_CLINIC_OR_DEPARTMENT_OTHER): Payer: Medicare Other | Attending: Internal Medicine

## 2017-12-08 ENCOUNTER — Encounter (HOSPITAL_BASED_OUTPATIENT_CLINIC_OR_DEPARTMENT_OTHER): Payer: Self-pay

## 2017-12-08 DIAGNOSIS — I10 Essential (primary) hypertension: Secondary | ICD-10-CM | POA: Diagnosis not present

## 2017-12-08 DIAGNOSIS — Y842 Radiological procedure and radiotherapy as the cause of abnormal reaction of the patient, or of later complication, without mention of misadventure at the time of the procedure: Secondary | ICD-10-CM | POA: Insufficient documentation

## 2017-12-08 DIAGNOSIS — N3041 Irradiation cystitis with hematuria: Secondary | ICD-10-CM | POA: Diagnosis not present

## 2017-12-08 DIAGNOSIS — Z8546 Personal history of malignant neoplasm of prostate: Secondary | ICD-10-CM | POA: Insufficient documentation

## 2017-12-08 DIAGNOSIS — I2510 Atherosclerotic heart disease of native coronary artery without angina pectoris: Secondary | ICD-10-CM | POA: Insufficient documentation

## 2017-12-08 DIAGNOSIS — G20 Parkinson's disease: Secondary | ICD-10-CM | POA: Insufficient documentation

## 2017-12-08 DIAGNOSIS — E119 Type 2 diabetes mellitus without complications: Secondary | ICD-10-CM | POA: Diagnosis not present

## 2017-12-09 ENCOUNTER — Other Ambulatory Visit (HOSPITAL_BASED_OUTPATIENT_CLINIC_OR_DEPARTMENT_OTHER): Payer: Self-pay | Admitting: Internal Medicine

## 2017-12-09 ENCOUNTER — Ambulatory Visit (HOSPITAL_COMMUNITY)
Admission: RE | Admit: 2017-12-09 | Discharge: 2017-12-09 | Disposition: A | Discharge status: Home / Self Care | Payer: Medicare Other | Source: Ambulatory Visit | Attending: Internal Medicine | Admitting: Internal Medicine

## 2017-12-09 DIAGNOSIS — N3041 Irradiation cystitis with hematuria: Secondary | ICD-10-CM

## 2017-12-14 DIAGNOSIS — N3041 Irradiation cystitis with hematuria: Secondary | ICD-10-CM | POA: Diagnosis not present

## 2017-12-15 DIAGNOSIS — N3041 Irradiation cystitis with hematuria: Secondary | ICD-10-CM | POA: Diagnosis not present

## 2017-12-16 DIAGNOSIS — I4510 Unspecified right bundle-branch block: Secondary | ICD-10-CM | POA: Insufficient documentation

## 2017-12-16 DIAGNOSIS — N3041 Irradiation cystitis with hematuria: Secondary | ICD-10-CM | POA: Diagnosis not present

## 2017-12-17 DIAGNOSIS — N3041 Irradiation cystitis with hematuria: Secondary | ICD-10-CM | POA: Diagnosis not present

## 2017-12-18 DIAGNOSIS — N3041 Irradiation cystitis with hematuria: Secondary | ICD-10-CM | POA: Diagnosis not present

## 2017-12-22 DIAGNOSIS — N3041 Irradiation cystitis with hematuria: Secondary | ICD-10-CM | POA: Diagnosis not present

## 2017-12-23 DIAGNOSIS — N3041 Irradiation cystitis with hematuria: Secondary | ICD-10-CM | POA: Diagnosis not present

## 2017-12-24 DIAGNOSIS — N3041 Irradiation cystitis with hematuria: Secondary | ICD-10-CM | POA: Diagnosis not present

## 2017-12-25 ENCOUNTER — Encounter (HOSPITAL_BASED_OUTPATIENT_CLINIC_OR_DEPARTMENT_OTHER): Payer: Medicare Other | Attending: Internal Medicine

## 2017-12-25 DIAGNOSIS — Y842 Radiological procedure and radiotherapy as the cause of abnormal reaction of the patient, or of later complication, without mention of misadventure at the time of the procedure: Secondary | ICD-10-CM | POA: Insufficient documentation

## 2017-12-25 DIAGNOSIS — Z8546 Personal history of malignant neoplasm of prostate: Secondary | ICD-10-CM | POA: Diagnosis not present

## 2017-12-25 DIAGNOSIS — N3041 Irradiation cystitis with hematuria: Secondary | ICD-10-CM | POA: Insufficient documentation

## 2017-12-28 DIAGNOSIS — N3041 Irradiation cystitis with hematuria: Secondary | ICD-10-CM | POA: Diagnosis not present

## 2017-12-29 DIAGNOSIS — N3041 Irradiation cystitis with hematuria: Secondary | ICD-10-CM | POA: Diagnosis not present

## 2017-12-30 DIAGNOSIS — N3041 Irradiation cystitis with hematuria: Secondary | ICD-10-CM | POA: Diagnosis not present

## 2017-12-31 DIAGNOSIS — N3041 Irradiation cystitis with hematuria: Secondary | ICD-10-CM | POA: Diagnosis not present

## 2018-01-01 DIAGNOSIS — N3041 Irradiation cystitis with hematuria: Secondary | ICD-10-CM | POA: Diagnosis not present

## 2018-01-04 DIAGNOSIS — N3041 Irradiation cystitis with hematuria: Secondary | ICD-10-CM | POA: Diagnosis not present

## 2018-01-05 DIAGNOSIS — N3041 Irradiation cystitis with hematuria: Secondary | ICD-10-CM | POA: Diagnosis not present

## 2018-01-06 DIAGNOSIS — N3041 Irradiation cystitis with hematuria: Secondary | ICD-10-CM | POA: Diagnosis not present

## 2018-01-07 DIAGNOSIS — N3041 Irradiation cystitis with hematuria: Secondary | ICD-10-CM | POA: Diagnosis not present

## 2018-01-08 DIAGNOSIS — N3041 Irradiation cystitis with hematuria: Secondary | ICD-10-CM | POA: Diagnosis not present

## 2018-01-11 DIAGNOSIS — N3041 Irradiation cystitis with hematuria: Secondary | ICD-10-CM | POA: Diagnosis not present

## 2018-01-12 DIAGNOSIS — N3041 Irradiation cystitis with hematuria: Secondary | ICD-10-CM | POA: Diagnosis not present

## 2018-01-13 DIAGNOSIS — N3041 Irradiation cystitis with hematuria: Secondary | ICD-10-CM | POA: Diagnosis not present

## 2018-01-14 DIAGNOSIS — N3041 Irradiation cystitis with hematuria: Secondary | ICD-10-CM | POA: Diagnosis not present

## 2018-01-18 DIAGNOSIS — N3041 Irradiation cystitis with hematuria: Secondary | ICD-10-CM | POA: Diagnosis not present

## 2018-01-19 DIAGNOSIS — N3041 Irradiation cystitis with hematuria: Secondary | ICD-10-CM | POA: Diagnosis not present

## 2018-01-20 DIAGNOSIS — N3041 Irradiation cystitis with hematuria: Secondary | ICD-10-CM | POA: Diagnosis not present

## 2018-01-25 ENCOUNTER — Encounter (HOSPITAL_BASED_OUTPATIENT_CLINIC_OR_DEPARTMENT_OTHER): Payer: Medicare Other | Attending: Internal Medicine

## 2018-01-25 DIAGNOSIS — Y842 Radiological procedure and radiotherapy as the cause of abnormal reaction of the patient, or of later complication, without mention of misadventure at the time of the procedure: Secondary | ICD-10-CM | POA: Diagnosis not present

## 2018-01-25 DIAGNOSIS — N3289 Other specified disorders of bladder: Secondary | ICD-10-CM | POA: Diagnosis not present

## 2018-01-26 DIAGNOSIS — N3289 Other specified disorders of bladder: Secondary | ICD-10-CM | POA: Diagnosis not present

## 2018-01-27 DIAGNOSIS — N3289 Other specified disorders of bladder: Secondary | ICD-10-CM | POA: Diagnosis not present

## 2018-01-28 DIAGNOSIS — N3289 Other specified disorders of bladder: Secondary | ICD-10-CM | POA: Diagnosis not present

## 2018-01-29 ENCOUNTER — Encounter (HOSPITAL_BASED_OUTPATIENT_CLINIC_OR_DEPARTMENT_OTHER): Payer: Medicare Other

## 2018-01-29 DIAGNOSIS — N3289 Other specified disorders of bladder: Secondary | ICD-10-CM | POA: Diagnosis not present

## 2018-02-01 DIAGNOSIS — N3289 Other specified disorders of bladder: Secondary | ICD-10-CM | POA: Diagnosis not present

## 2018-02-02 DIAGNOSIS — N3289 Other specified disorders of bladder: Secondary | ICD-10-CM | POA: Diagnosis not present

## 2018-02-03 DIAGNOSIS — N3289 Other specified disorders of bladder: Secondary | ICD-10-CM | POA: Diagnosis not present

## 2018-02-04 DIAGNOSIS — N3289 Other specified disorders of bladder: Secondary | ICD-10-CM | POA: Diagnosis not present

## 2018-02-05 DIAGNOSIS — N3289 Other specified disorders of bladder: Secondary | ICD-10-CM | POA: Diagnosis not present

## 2018-02-08 DIAGNOSIS — N3289 Other specified disorders of bladder: Secondary | ICD-10-CM | POA: Diagnosis not present

## 2018-02-09 DIAGNOSIS — N3289 Other specified disorders of bladder: Secondary | ICD-10-CM | POA: Diagnosis not present

## 2018-02-10 DIAGNOSIS — N3289 Other specified disorders of bladder: Secondary | ICD-10-CM | POA: Diagnosis not present

## 2018-02-11 DIAGNOSIS — N3289 Other specified disorders of bladder: Secondary | ICD-10-CM | POA: Diagnosis not present

## 2018-02-12 DIAGNOSIS — N3289 Other specified disorders of bladder: Secondary | ICD-10-CM | POA: Diagnosis not present

## 2018-03-04 DIAGNOSIS — G4752 REM sleep behavior disorder: Secondary | ICD-10-CM | POA: Insufficient documentation

## 2018-03-04 DIAGNOSIS — F411 Generalized anxiety disorder: Secondary | ICD-10-CM | POA: Insufficient documentation

## 2018-07-09 DIAGNOSIS — D72820 Lymphocytosis (symptomatic): Secondary | ICD-10-CM | POA: Insufficient documentation

## 2018-09-09 ENCOUNTER — Other Ambulatory Visit: Payer: Self-pay

## 2018-09-10 ENCOUNTER — Inpatient Hospital Stay: Payer: Medicare Other | Attending: Internal Medicine

## 2018-09-10 ENCOUNTER — Other Ambulatory Visit: Payer: Self-pay

## 2018-09-10 ENCOUNTER — Encounter: Payer: Self-pay | Admitting: Internal Medicine

## 2018-09-10 ENCOUNTER — Inpatient Hospital Stay (HOSPITAL_BASED_OUTPATIENT_CLINIC_OR_DEPARTMENT_OTHER): Payer: Medicare Other | Admitting: Internal Medicine

## 2018-09-10 DIAGNOSIS — C8307 Small cell B-cell lymphoma, spleen: Secondary | ICD-10-CM

## 2018-09-10 DIAGNOSIS — Z79899 Other long term (current) drug therapy: Secondary | ICD-10-CM

## 2018-09-10 DIAGNOSIS — D72821 Monocytosis (symptomatic): Secondary | ICD-10-CM | POA: Insufficient documentation

## 2018-09-10 DIAGNOSIS — I10 Essential (primary) hypertension: Secondary | ICD-10-CM | POA: Insufficient documentation

## 2018-09-10 DIAGNOSIS — D696 Thrombocytopenia, unspecified: Secondary | ICD-10-CM | POA: Insufficient documentation

## 2018-09-10 DIAGNOSIS — G20 Parkinson's disease: Secondary | ICD-10-CM

## 2018-09-10 DIAGNOSIS — Z9081 Acquired absence of spleen: Secondary | ICD-10-CM

## 2018-09-10 DIAGNOSIS — C61 Malignant neoplasm of prostate: Secondary | ICD-10-CM

## 2018-09-10 DIAGNOSIS — Z923 Personal history of irradiation: Secondary | ICD-10-CM | POA: Diagnosis not present

## 2018-09-10 LAB — CBC WITH DIFFERENTIAL/PLATELET
Abs Immature Granulocytes: 0.04 10*3/uL (ref 0.00–0.07)
Basophils Absolute: 0.1 10*3/uL (ref 0.0–0.1)
Basophils Relative: 1 %
Eosinophils Absolute: 0.2 10*3/uL (ref 0.0–0.5)
Eosinophils Relative: 2 %
HCT: 43.6 % (ref 39.0–52.0)
Hemoglobin: 14.7 g/dL (ref 13.0–17.0)
Immature Granulocytes: 0 %
Lymphocytes Relative: 23 %
Lymphs Abs: 3 10*3/uL (ref 0.7–4.0)
MCH: 31.4 pg (ref 26.0–34.0)
MCHC: 33.7 g/dL (ref 30.0–36.0)
MCV: 93.2 fL (ref 80.0–100.0)
Monocytes Absolute: 1.2 10*3/uL — ABNORMAL HIGH (ref 0.1–1.0)
Monocytes Relative: 10 %
Neutro Abs: 8.2 10*3/uL — ABNORMAL HIGH (ref 1.7–7.7)
Neutrophils Relative %: 64 %
Platelets: 277 10*3/uL (ref 150–400)
RBC: 4.68 MIL/uL (ref 4.22–5.81)
RDW: 12.8 % (ref 11.5–15.5)
WBC: 12.8 10*3/uL — ABNORMAL HIGH (ref 4.0–10.5)
nRBC: 0 % (ref 0.0–0.2)

## 2018-09-10 LAB — COMPREHENSIVE METABOLIC PANEL
ALT: 5 U/L (ref 0–44)
AST: 22 U/L (ref 15–41)
Albumin: 4.3 g/dL (ref 3.5–5.0)
Alkaline Phosphatase: 89 U/L (ref 38–126)
Anion gap: 9 (ref 5–15)
BUN: 22 mg/dL (ref 8–23)
CO2: 27 mmol/L (ref 22–32)
Calcium: 9.6 mg/dL (ref 8.9–10.3)
Chloride: 105 mmol/L (ref 98–111)
Creatinine, Ser: 0.98 mg/dL (ref 0.61–1.24)
GFR calc Af Amer: >60 mL/min (ref >60)
GFR calc non Af Amer: >60 mL/min (ref >60)
Glucose, Bld: 115 mg/dL — ABNORMAL HIGH (ref 70–99)
Potassium: 4.5 mmol/L (ref 3.5–5.1)
Sodium: 141 mmol/L (ref 135–145)
Total Bilirubin: 0.7 mg/dL (ref 0.3–1.2)
Total Protein: 7.2 g/dL (ref 6.5–8.1)

## 2018-09-10 LAB — LACTATE DEHYDROGENASE: LDH: 115 U/L (ref 98–192)

## 2018-09-10 NOTE — Assessment & Plan Note (Addendum)
Status post splenectomy;  clinically no evidence of recurrence-CBC within normal limits except for mildly elevated white count [see discussion below].   # Prostate cancer- with recurrence in 2014 in the prostate bed status post radiation; followed by [facility] urology.   # radiation cyctitis- s/p HBO-improved.   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  Stable.  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   # DISPOSITION: # Follow up in 12 months-MD/labs-cbc/cmp/ldh- Dr.B

## 2018-09-10 NOTE — Progress Notes (Signed)
[place] OFFICE PROGRESS NOTE  Patient Care Team: Dion Body, MD as PCP - General (Family Medicine)  Cancer Staging CA of prostate [facility]) Staging form: Prostate, AJCC 7th Edition - Clinical: No stage assigned - Unsigned    Oncology History Overview Note  thrombocytopenia, transient neutropenia enlarged spleen for several years.patient was being followed at [facility]. 2.Bone marrow biopsy (October, 2012)  No diagnostic features suggestive of myeloproliferative disease.   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  Patient states that he was diagnosed with radiation cystitis late December 2019.  Patient underwent hyperbaric oxygen therapy.  Patient is currently improved.  States his Parkinson's disease he is stable.  No falls.  Mild fatigue.  No fever lumps or weight loss or abdominal pain nausea vomiting.  Review of Systems  Constitutional: Positive for malaise/fatigue. Negative for chills, diaphoresis, fever and weight loss.  HENT: Negative for nosebleeds and sore throat.   Eyes: Negative for double vision.   Respiratory: Negative for cough, hemoptysis, sputum production, shortness of breath and wheezing.   Cardiovascular: Negative for chest pain, palpitations, orthopnea and leg swelling.  Gastrointestinal: Negative for abdominal pain, blood in stool, constipation, diarrhea, heartburn, melena, nausea and vomiting.  Genitourinary: Negative for dysuria, frequency and urgency.  Musculoskeletal: Negative for back pain and joint pain.  Skin: Negative.  Negative for itching and rash.  Neurological: Negative for dizziness, tingling, focal weakness, weakness and headaches.  Endo/Heme/Allergies: Does not bruise/bleed easily.  Psychiatric/Behavioral: Negative for depression. The patient is not nervous/anxious and does not have insomnia.       PAST MEDICAL HISTORY :  Past Medical History:  Diagnosis Date  . History of radiation therapy 11/03/13- 12/19/13   prostate bed 6600 cGy 33 sessions  . 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 Prostate cancer [facility]) 2003   s/p radical prostatectomy  . Thrombocytopenia ([place])     PAST SURGICAL HISTORY :   Past Surgical History:  Procedure Laterality Date  . RETROPUBIC PROSTATECTOMY  03/02/2001   Gleason 7  . SPLENECTOMY  04/2012   [facility]    FAMILY HISTORY :   Family History  Problem Relation Age of Onset  . Cancer Father        prostate  . Alzheimer's disease Mother     SOCIAL HISTORY:   Social History   Tobacco Use  . Smoking status: Never Smoker  . Smokeless tobacco: Never Used  Substance Use Topics  . Alcohol use: No  . Drug use: No    ALLERGIES:  is allergic to niacin and oxycontin [oxycodone hcl].  MEDICATIONS:  Current Outpatient Medications  Medication Sig Dispense Refill  . pentosan polysulfate (ELMIRON) 100 MG capsule TAKE 1 CAPSULE (100 MG TOTAL) BY MOUTH 3 TIMES DAILY BEFORE MEALS.    Marland Kitchen rOPINIRole (REQUIP) 0.25 MG tablet Take by mouth.    Marland Kitchen tiZANidine (ZANAFLEX) 4  MG tablet Take by mouth.    . carbidopa-levodopa (SINEMET IR) 25-100 MG tablet Take 2 tablets by mouth 4 (four) times daily.     . Cholecalciferol (VITAMIN D3) 2000 units TABS Take 2,000 Units by mouth daily.     Marland Kitchen gabapentin (NEURONTIN) 300 MG capsule Take 300 mg by mouth 2 (two) times daily.     Marland Kitchen lisinopril (PRINIVIL,ZESTRIL) 10 MG tablet Take 10 mg by mouth daily.     Marland Kitchen lovastatin (MEVACOR) 20 MG tablet TAKE 1 TABLET BY MOUTH EVERY DAY AT NIGHT    . Multiple Vitamin (MULTI-VITAMINS) TABS Take 1 tablet by mouth daily.     . sildenafil (REVATIO) 20 MG tablet Take 1-5 tablets by mouth as needed (erectile dysfucntion).   3  . sildenafil (VIAGRA) 100 MG tablet Take 100 mg by mouth daily as needed for erectile dysfunction.    Marland Kitchen venlafaxine (EFFEXOR) 37.5 MG tablet Take 37.5 mg by mouth 2 (two) times daily.     No current facility-administered medications for this visit.     PHYSICAL EXAMINATION: ECOG PERFORMANCE STATUS: 0 - Asymptomatic  BP 127/85 (Patient Position: Sitting)   Pulse 80   Temp 98.9 F (37.2 C) (Tympanic)   Wt 228 lb 9.6 oz (103.7 kg)   BMI 33.76 kg/m   Filed Weights   09/10/18 1026  Weight: 228 lb 9.6 oz (103.7 kg)    Physical Exam  Constitutional: He is oriented to person, place, and time and well-developed, well-nourished, and in no distress.  HENT:  Head: Normocephalic and atraumatic.  Mouth/Throat: Oropharynx is clear and moist. No oropharyngeal exudate.  Eyes: Pupils are equal, round, and reactive to light.  Neck: Normal range of motion. Neck supple.  Cardiovascular: Normal rate and regular rhythm.  Pulmonary/Chest: Effort normal and breath sounds normal. No respiratory distress. He has no wheezes.  Abdominal: Soft. Bowel sounds are normal. He exhibits no distension and no mass. There is no abdominal tenderness. There is no rebound and no guarding.  Musculoskeletal: Normal range of motion.        General: No tenderness or edema.  Neurological: He is alert  and oriented to person, place, and time.  Skin: Skin is warm.  Psychiatric: Affect normal.     LABORATORY DATA:  I have reviewed the data as listed    Component Value Date/Time   NA 141 09/10/2018 0953   NA 143 01/25/2014 1128   K 4.5 09/10/2018 0953   K 4.3 01/25/2014 1128   CL 105 09/10/2018 0953   CL 106 01/25/2014 1128   CO2 27 09/10/2018 0953   CO2 29 01/25/2014 1128   GLUCOSE 115 (H) 09/10/2018 0953   GLUCOSE 111 (H) 01/25/2014 1128   BUN 22 09/10/2018 0953   BUN 14 01/25/2014 1128   CREATININE 0.98 09/10/2018 0953   CREATININE 1.03 01/25/2014 1128   CALCIUM 9.6 09/10/2018 0953   CALCIUM 9.5 01/25/2014 1128   PROT 7.2 09/10/2018 0953   PROT 6.8 01/25/2014 1128   ALBUMIN 4.3 09/10/2018 0953   ALBUMIN 4.1 01/25/2014 1128   AST 22 09/10/2018 0953   AST 22 01/25/2014 1128   ALT 5 09/10/2018 0953   ALT 43 01/25/2014 1128  ALKPHOS 89 09/10/2018 0953   ALKPHOS 114 01/25/2014 1128   BILITOT 0.7 09/10/2018 0953   BILITOT 0.7 01/25/2014 1128   GFRNONAA >60 09/10/2018 0953   GFRNONAA >60 01/25/2014 1128   GFRNONAA >60 07/13/2013 0951   GFRAA >60 09/10/2018 0953   GFRAA >60 01/25/2014 1128   GFRAA >60 07/13/2013 0951    No results found for: SPEP, UPEP  Lab Results  Component Value Date   WBC 12.8 (H) 09/10/2018   NEUTROABS 8.2 (H) 09/10/2018   HGB 14.7 09/10/2018   HCT 43.6 09/10/2018   MCV 93.2 09/10/2018   PLT 277 09/10/2018      Chemistry      Component Value Date/Time   NA 141 09/10/2018 0953   NA 143 01/25/2014 1128   K 4.5 09/10/2018 0953   K 4.3 01/25/2014 1128   CL 105 09/10/2018 0953   CL 106 01/25/2014 1128   CO2 27 09/10/2018 0953   CO2 29 01/25/2014 1128   BUN 22 09/10/2018 0953   BUN 14 01/25/2014 1128   CREATININE 0.98 09/10/2018 0953   CREATININE 1.03 01/25/2014 1128      Component Value Date/Time   CALCIUM 9.6 09/10/2018 0953   CALCIUM 9.5 01/25/2014 1128   ALKPHOS 89 09/10/2018 0953   ALKPHOS 114 01/25/2014 1128   AST 22  09/10/2018 0953   AST 22 01/25/2014 1128   ALT 5 09/10/2018 0953   ALT 43 01/25/2014 1128   BILITOT 0.7 09/10/2018 0953   BILITOT 0.7 01/25/2014 1128       RADIOGRAPHIC STUDIES: I have personally reviewed the radiological images as listed and agreed with the findings in the report. No results found.   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   # Prostate cancer- with recurrence in 2014 in the prostate bed status post radiation; followed by [facility] urology.   # radiation cyctitis- s/p HBO-improved.   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  Stable.  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   # DISPOSITION: # Follow up in 12 months-MD/labs-cbc/cmp/ldh- Dr.B    Orders Placed This Encounter  Procedures  . CBC with Differential    Standing Status:   Future    Standing Expiration Date:   03/12/2020  . Comprehensive metabolic panel    Standing Status:   Future    Standing Expiration Date:   03/12/2020  . Lactate dehydrogenase    Standing Status:   Future    Standing Expiration Date:   03/12/2020   All questions were answered. The patient knows to call the clinic with any problems, questions or concerns.      Cammie Sickle, MD 09/10/2018 11:17 AM

## 2019-06-23 ENCOUNTER — Other Ambulatory Visit: Payer: Self-pay

## 2019-06-23 ENCOUNTER — Emergency Department
Admission: EM | Admit: 2019-06-23 | Discharge: 2019-06-23 | Disposition: A | Discharge status: Home / Self Care | Payer: Medicare PPO | Attending: Emergency Medicine | Admitting: Emergency Medicine

## 2019-06-23 ENCOUNTER — Emergency Department: Payer: Medicare PPO

## 2019-06-23 DIAGNOSIS — Z79899 Other long term (current) drug therapy: Secondary | ICD-10-CM | POA: Insufficient documentation

## 2019-06-23 DIAGNOSIS — G20 Parkinson's disease: Secondary | ICD-10-CM | POA: Insufficient documentation

## 2019-06-23 DIAGNOSIS — I10 Essential (primary) hypertension: Secondary | ICD-10-CM | POA: Insufficient documentation

## 2019-06-23 DIAGNOSIS — E119 Type 2 diabetes mellitus without complications: Secondary | ICD-10-CM | POA: Insufficient documentation

## 2019-06-23 DIAGNOSIS — R55 Syncope and collapse: Secondary | ICD-10-CM | POA: Diagnosis present

## 2019-06-23 LAB — CBC
HCT: 46.1 % (ref 39.0–52.0)
Hemoglobin: 15.5 g/dL (ref 13.0–17.0)
MCH: 32 pg (ref 26.0–34.0)
MCHC: 33.6 g/dL (ref 30.0–36.0)
MCV: 95.1 fL (ref 80.0–100.0)
Platelets: 283 10*3/uL (ref 150–400)
RBC: 4.85 MIL/uL (ref 4.22–5.81)
RDW: 13 % (ref 11.5–15.5)
WBC: 11.5 10*3/uL — ABNORMAL HIGH (ref 4.0–10.5)
nRBC: 0 % (ref 0.0–0.2)

## 2019-06-23 LAB — BASIC METABOLIC PANEL
Anion gap: 7 (ref 5–15)
BUN: 32 mg/dL — ABNORMAL HIGH (ref 8–23)
CO2: 28 mmol/L (ref 22–32)
Calcium: 9.7 mg/dL (ref 8.9–10.3)
Chloride: 106 mmol/L (ref 98–111)
Creatinine, Ser: 1.05 mg/dL (ref 0.61–1.24)
GFR calc Af Amer: >60 mL/min (ref >60)
GFR calc non Af Amer: >60 mL/min (ref >60)
Glucose, Bld: 98 mg/dL (ref 70–99)
Potassium: 5.1 mmol/L (ref 3.5–5.1)
Sodium: 141 mmol/L (ref 135–145)

## 2019-06-23 LAB — URINALYSIS, COMPLETE (UACMP) WITH MICROSCOPIC
Bilirubin Urine: NEGATIVE
Glucose, UA: NEGATIVE mg/dL
Hgb urine dipstick: NEGATIVE
Ketones, ur: 5 mg/dL — AB
Leukocytes,Ua: NEGATIVE
Nitrite: NEGATIVE
Protein, ur: 100 mg/dL — AB
Specific Gravity, Urine: 1.021 (ref 1.005–1.030)
pH: 5 (ref 5.0–8.0)

## 2019-06-23 MED ORDER — SODIUM CHLORIDE 0.9% FLUSH: 3.0000 mL | Freq: Once | INTRAVENOUS | Status: DC

## 2019-06-23 NOTE — ED Provider Notes (Signed)
[facility] Emergency Department Provider Note  ____________________________________________   First MD Initiated Contact with Patient 06/23/19 2153     (approximate)  I have reviewed the triage vital signs and the nursing notes.   HISTORY  Chief Complaint Loss of Consciousness    HPI Chad Sandoval is a 74 y.o. male with history of Parkinson's, prostate cancer status post mastectomy marginal zone lymphoma of spleen who comes in for syncopal episode that happened yesterday.  Patient was sent in by neurologist to evaluate with CT scan.  Patient played about 18 holes of golf yesterday morning and about an hour after getting home patient walked to the kitchen got dizzy and passed out.  Patient hit his head on the countertop.  Patient had some LOC for a few seconds.  There was concern that patient was just dehydrated because his daughter took his blood pressure and it was low.  However today it was better.  His neurologist wanted to come to the ER just to make sure there is no signs of intracranial hemorrhage.  Patient states that he feels at his baseline self and he feels completely fine at this time.  He is not had recurrent dizziness or syncope.  Denies any shortness of breath, abdominal pain, leg swelling.          Past Medical History:  Diagnosis Date  . History of radiation therapy 11/03/13- 12/19/13   prostate bed 6600 cGy 33 sessions  . 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 Parkinson disease ([place]) 2016  . Prostate cancer [facility]) 2003   s/p radical prostatectomy  . Thrombocytopenia [facility])     Patient Active Problem List   Diagnosis Date Noted  . Lymphocytosis 07/09/2018  . Anxiety, generalized 03/04/2018  . 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 RBBB (right bundle branch block) 12/16/2017  . Gross hematuria 11/19/2017  . Radiation cystitis 11/19/2017  . Parkinson's disease ([place])  10/29/2017  . Recurrent nephrolithiasis 03/24/2016  . Pure hypercholesterolemia 10/17/2015  . Essential (primary) hypertension 01/29/2015  . Class 1 obesity 01/29/2015  . H/O malignant neoplasm of prostate 12/15/2014  . 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 Diabetes mellitus, type 2 ([place]) 02/07/2014  . Type 2 diabetes mellitus ([place]) 02/07/2014  . Bladder neck obstruction 01/02/2014  . Bladder retention 01/02/2014  . Abnormal prostate specific antigen 09/29/2011  . Elevated prostate specific antigen (PSA) 09/29/2011  . CA of prostate ([place]) 04/07/2011  . 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 Malignant neoplasm of prostate ([place]) 04/07/2011    Past Surgical History:  Procedure Laterality Date  . RETROPUBIC PROSTATECTOMY  03/02/2001   Gleason 7  . SPLENECTOMY  04/2012   [facility]    Prior to Admission medications   Medication Sig Start Date End Date Taking? Authorizing Provider  carbidopa-levodopa (SINEMET IR) 25-100 MG tablet Take 2 tablets by mouth 4 (four) times daily.     [provider]  Cholecalciferol (VITAMIN D3) 2000 units TABS Take 2,000 Units by mouth daily.     [provider]  gabapentin (NEURONTIN) 300 MG capsule Take 300 mg by mouth 2 (two) times daily.  01/21/17   [provider]  lisinopril (PRINIVIL,ZESTRIL) 10 MG tablet Take 10 mg by mouth daily.  01/22/15   [provider]  lovastatin (MEVACOR) 20 MG tablet TAKE 1 TABLET BY MOUTH EVERY DAY AT NIGHT 08/29/18  [provider]  Multiple Vitamin (MULTI-VITAMINS) TABS Take 1 tablet by mouth daily.     [provider]  pentosan polysulfate (ELMIRON) 100 MG capsule TAKE 1 CAPSULE (100 MG TOTAL) BY MOUTH 3 TIMES DAILY BEFORE MEALS. 11/02/17   [provider]  rOPINIRole (REQUIP) 0.25 MG tablet Take by mouth. 09/07/18 10/07/18  [provider]  sildenafil (REVATIO) 20 MG tablet Take 1-5 tablets by mouth as needed  (erectile dysfucntion).  09/14/17   [provider]  sildenafil (VIAGRA) 100 MG tablet Take 100 mg by mouth daily as needed for erectile dysfunction.    [provider]  tiZANidine (ZANAFLEX) 4 MG tablet Take by mouth. 08/16/18 08/16/19  [provider]  venlafaxine (EFFEXOR) 37.5 MG tablet Take 37.5 mg by mouth 2 (two) times daily. 06/24/18   [provider]    Allergies Niacin and Oxycontin [oxycodone hcl]  Family History  Problem Relation Age of Onset  . Cancer Father        prostate  . Alzheimer's disease Mother     Social History Social History   Tobacco Use  . Smoking status: Never Smoker  . Smokeless tobacco: Never Used  Substance Use Topics  . Alcohol use: No  . Drug use: No      Review of Systems Constitutional: No fever/chills syncopal episode that occurred yesterday Eyes: No visual changes. ENT: No sore throat. Cardiovascular: Denies chest pain. Respiratory: Denies shortness of breath. Gastrointestinal: No abdominal pain.  No nausea, no vomiting.  No diarrhea.  No constipation. Genitourinary: Negative for dysuria. Musculoskeletal: Negative for back pain. Skin: Negative for rash. Neurological: Negative for headaches, focal weakness or numbness.  Positive hit head All other ROS negative ____________________________________________   PHYSICAL EXAM:  VITAL SIGNS: ED Triage Vitals  Enc Vitals Group     BP 06/23/19 1730 (!) 143/83     Pulse Rate 06/23/19 1730 84     Resp 06/23/19 1730 18     Temp 06/23/19 1730 98 F (36.7 C)     Temp Source 06/23/19 1730 Oral     SpO2 06/23/19 1730 96 %     Weight 06/23/19 1733 228 lb (103.4 kg)     Height 06/23/19 1733 5\' 8"  (1.727 m)     Head Circumference --      Peak Flow --      Pain Score 06/23/19 1733 0     Pain Loc --      Pain Edu? --      Excl. in [place]? --     Constitutional: Alert and oriented. Well appearing and in no acute distress. Eyes: Conjunctivae are normal.  EOMI. Head: Atraumatic. Nose: No congestion/rhinnorhea. Mouth/Throat: Mucous membranes are moist.   Neck: No stridor. Trachea Midline. FROM Cardiovascular: Normal rate, regular rhythm. Grossly normal heart sounds.  Good peripheral circulation. Respiratory: Normal respiratory effort.  No retractions. Lungs CTAB. Gastrointestinal: Soft and nontender. No distention. No abdominal bruits.  Musculoskeletal: No lower extremity tenderness nor edema.  No joint effusions. Neurologic:  Normal speech and language. No gross focal neurologic deficits are appreciated.  Skin:  Skin is warm, dry and intact. No rash noted. Psychiatric: Mood and affect are normal. Speech and behavior are normal. GU: Deferred   ____________________________________________   LABS (all labs ordered are listed, but only abnormal results are displayed)  Labs Reviewed  BASIC METABOLIC PANEL - Abnormal; Notable for the following components:      Result Value   BUN 32 (*)  All other components within normal limits  CBC - Abnormal; Notable for the following components:   WBC 11.5 (*)    All other components within normal limits  URINALYSIS, COMPLETE (UACMP) WITH MICROSCOPIC  CBG MONITORING, ED   ____________________________________________   ED ECG REPORT I, Vanessa [place], the attending physician, personally viewed and interpreted this ECG.  EKG is normal sinus rate of 79, no ST elevation, T wave inversion in lead III with a S1Q3T3 as well as a T wave inversion in V3.  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 EXAM: CT HEAD WITHOUT CONTRAST CT CERVICAL SPINE WITHOUT  CONTRAST TECHNIQUE: Multidetector CT imaging of the head and cervical spine was performed following the standard protocol without intravenous contrast. Multiplanar CT image reconstructions of the cervical spine were also generated. COMPARISON:  None. FINDINGS: CT HEAD FINDINGS Brain: Mild atrophy.  Negative for acute infarct, hemorrhage, mass. Vascular: Atherosclerotic calcification distal left vertebral artery and left cavernous carotid. Negative for hyperdense vessel. Skull: Negative for skull fracture Sinuses/Orbits: Paranasal sinuses clear. Bilateral cataract extraction. Other: None CT CERVICAL SPINE FINDINGS Alignment: Normal Skull base and vertebrae: Negative for fracture. Soft tissues and spinal canal: Negative Disc levels: Mild disc degeneration and spurring most prominent C6-7. Foraminal narrowing bilaterally at C3-4, C4-5, C5-6, and C6-7 due to spurring. Upper chest: Negative Other: None IMPRESSION: No acute intracranial abnormality Negative for cervical spine fracture. Electronically Signed   By: Franchot Gallo M.D.   On: 06/23/2019 21:05   CT Cervical Spine Wo Contrast  Result Date: 06/23/2019 CLINICAL DATA:  Syncope with head injury from fall yesterday. EXAM: CT HEAD WITHOUT CONTRAST CT CERVICAL SPINE WITHOUT CONTRAST TECHNIQUE: Multidetector CT imaging of the head and cervical spine was performed following the standard protocol without intravenous contrast. Multiplanar CT image reconstructions of the cervical spine were also generated. COMPARISON:  None. FINDINGS: CT HEAD FINDINGS Brain: Mild atrophy.  Negative for acute infarct, hemorrhage, mass. Vascular: Atherosclerotic calcification distal left vertebral artery and left cavernous carotid. Negative for hyperdense vessel. Skull: Negative for skull fracture Sinuses/Orbits: Paranasal sinuses clear. Bilateral cataract extraction. Other: None CT CERVICAL SPINE FINDINGS Alignment: Normal Skull base and vertebrae: Negative for fracture. Soft tissues  and spinal canal: Negative Disc levels: Mild disc degeneration and spurring most prominent C6-7. Foraminal narrowing bilaterally at C3-4, C4-5, C5-6, and C6-7 due to spurring. Upper chest: Negative Other: None IMPRESSION: No acute intracranial abnormality Negative for cervical spine fracture. Electronically Signed   By: Franchot Gallo M.D.   On: 06/23/2019 21:05    ____________________________________________   PROCEDURES  Procedure(s) performed (including Critical Care):  Procedures   ____________________________________________   INITIAL IMPRESSION / ASSESSMENT AND PLAN / ED COURSE  RISHABH MCMURDIE was evaluated in Emergency Department on 06/23/2019 for the symptoms described in the history of present illness. He was evaluated in the context of the global COVID-19 pandemic, which necessitated consideration that the patient might be at risk for infection with the SARS-CoV-2 virus that causes COVID-19. Institutional protocols and algorithms that pertain to the evaluation of patients at risk for COVID-19 are in a state of rapid change based on information released by regulatory bodies including the CDC and federal  and state organizations. These policies and algorithms were followed during the patient's care in the ED.    Patient is a 74 year old who comes in with syncopal episode that happened yesterday.  States has been feeling well now for 24 hours since this event.  Patient was initially hypotensive and had just played golf and thinks he was dehydrated.  I suspect is most likely related to dehydration will get labs evaluate for Electra abnormalities, AKI, EKG to evaluate for arrhythmia.  He denies any shortness of breath to suggest PE, no abdominal pain distress AAA.  He has been from his baseline self.  Sent over here for CT scan of his head to rule intracranial hemorrhage.   Labs show BUN of 32 please had some previous elevations in the 20 as well No signs of anemia to suggest GI  bleed.  EKG shows bifascicular block.  Patient's had prior right bundle branch block so does not look a ton different than normal.  Discussed with patient that I felt like his symptoms were more likely related to dehydration but that we do sometimes admit people to the hospital for cardiac monitoring, echo and given the concern for his abnormal EKG we could admit patient to the hospital.  Patient states that he would prefer to go home I think this is reasonable given that this happened 24 hours ago and has had no further episodes..  He stated that if he had another episode of syncope he will return to the ER.  Otherwise given the abnormal EKG I did give him cardiology number for follow-up for echocardiogram, and Holter monitor if they felt necessary.  I discussed the provisional nature of ED diagnosis, the treatment so far, the ongoing plan of care, follow up appointments and return precautions with the patient and any family or support people present. They expressed understanding and agreed with the plan, discharged home.   ______________________________________   FINAL CLINICAL IMPRESSION(S) / ED DIAGNOSES   Final diagnoses:  Syncope and collapse      MEDICATIONS GIVEN DURING THIS VISIT:  Medications  sodium chloride flush (NS) 0.9 % injection 3 mL (has no administration in time range)     ED Discharge Orders    None       Note:  This document was prepared using Dragon voice recognition software and may include unintentional dictation errors.   Vanessa [place], MD 06/23/19 [phone]

## 2019-06-23 NOTE — ED Triage Notes (Signed)
PT to ED from home. PT played 18 holes of golf yesterday morning, about an hour after getting home pt walked into kitchen, got dizzy and passed out. PT hit head on counter top. PT states he was only out for "seconds". Got right back up, was sore in head and shoulder. Denies pain at this time. PT AOx4. PERRLA

## 2019-06-23 NOTE — ED Notes (Addendum)
Pt given urinal and agrees to attempt to provide urine sample once he feels that he can. Repeat EKG to EDP [place].

## 2019-06-23 NOTE — ED Notes (Signed)
Patient discharged to home per MD order. Patient in stable condition, and deemed medically cleared by ED provider for discharge. Discharge instructions reviewed with patient/family using "Teach Back"; verbalized understanding of medication education and administration, and information about follow-up care. Denies further concerns. ° °

## 2019-06-23 NOTE — ED Triage Notes (Signed)
First RN Note: Pt presents to ED via POV, ambulatory from front desk. Per patient, syncopal episode yesterday and hit head. Pt states sent by neurologist for CT scan.

## 2019-06-23 NOTE — Discharge Instructions (Addendum)
  CT imaging was negative.  This most likely was from dehydration but it could be it been from your heart.  You can follow-up with cardiology given your slightly abnormal EKG to evaluate with echocardiogram and possibly a Holter monitor.  You should call them to make an appointment and hopefully be seen in the next week.  If you have another episode of loss of consciousness he should return to the ER immediately.   No acute intracranial abnormality     Negative for cervical spine fracture.

## 2019-06-23 NOTE — ED Notes (Signed)
See triage note. Pt denies full loss of consciousness but then states he "woke up" after hitting head when he blacked out. Reports dx of Parkinson's in 2016. Currently A&Ox4. Pt states that he thinks he "got dehydrated" while playing golf.

## 2019-06-23 NOTE — ED Notes (Signed)
Pt states that he does not wish to stay until urine sample results. Requesting to leave now. EDP Funke notified in person. States pt is next to be d/c.

## 2019-09-12 ENCOUNTER — Inpatient Hospital Stay: Payer: Medicare PPO | Admitting: Internal Medicine

## 2019-09-12 ENCOUNTER — Inpatient Hospital Stay: Payer: Medicare PPO

## 2019-09-13 ENCOUNTER — Inpatient Hospital Stay: Payer: Medicare PPO | Attending: Internal Medicine

## 2019-09-13 ENCOUNTER — Inpatient Hospital Stay: Payer: Medicare PPO | Admitting: Internal Medicine

## 2019-09-13 ENCOUNTER — Other Ambulatory Visit: Payer: Self-pay

## 2019-09-13 DIAGNOSIS — I10 Essential (primary) hypertension: Secondary | ICD-10-CM | POA: Diagnosis not present

## 2019-09-13 DIAGNOSIS — C8307 Small cell B-cell lymphoma, spleen: Secondary | ICD-10-CM | POA: Insufficient documentation

## 2019-09-13 DIAGNOSIS — Z79899 Other long term (current) drug therapy: Secondary | ICD-10-CM | POA: Insufficient documentation

## 2019-09-13 DIAGNOSIS — Z9081 Acquired absence of spleen: Secondary | ICD-10-CM | POA: Diagnosis not present

## 2019-09-13 DIAGNOSIS — R7989 Other specified abnormal findings of blood chemistry: Secondary | ICD-10-CM | POA: Insufficient documentation

## 2019-09-13 DIAGNOSIS — G20 Parkinson's disease: Secondary | ICD-10-CM | POA: Diagnosis not present

## 2019-09-13 DIAGNOSIS — R5383 Other fatigue: Secondary | ICD-10-CM | POA: Insufficient documentation

## 2019-09-13 DIAGNOSIS — Z9079 Acquired absence of other genital organ(s): Secondary | ICD-10-CM | POA: Diagnosis not present

## 2019-09-13 DIAGNOSIS — R5381 Other malaise: Secondary | ICD-10-CM | POA: Diagnosis not present

## 2019-09-13 DIAGNOSIS — Z923 Personal history of irradiation: Secondary | ICD-10-CM | POA: Insufficient documentation

## 2019-09-13 DIAGNOSIS — D72821 Monocytosis (symptomatic): Secondary | ICD-10-CM | POA: Insufficient documentation

## 2019-09-13 DIAGNOSIS — Z8546 Personal history of malignant neoplasm of prostate: Secondary | ICD-10-CM | POA: Diagnosis not present

## 2019-09-13 LAB — COMPREHENSIVE METABOLIC PANEL
ALT: <5 U/L (ref 0–44)
AST: 23 U/L (ref 15–41)
Albumin: 4.4 g/dL (ref 3.5–5.0)
Alkaline Phosphatase: 83 U/L (ref 38–126)
Anion gap: 8 (ref 5–15)
BUN: 22 mg/dL (ref 8–23)
CO2: 27 mmol/L (ref 22–32)
Calcium: 8.9 mg/dL (ref 8.9–10.3)
Chloride: 106 mmol/L (ref 98–111)
Creatinine, Ser: 1.1 mg/dL (ref 0.61–1.24)
GFR calc Af Amer: >60 mL/min (ref >60)
GFR calc non Af Amer: >60 mL/min (ref >60)
Glucose, Bld: 108 mg/dL — ABNORMAL HIGH (ref 70–99)
Potassium: 4.2 mmol/L (ref 3.5–5.1)
Sodium: 141 mmol/L (ref 135–145)
Total Bilirubin: 0.9 mg/dL (ref 0.3–1.2)
Total Protein: 6.7 g/dL (ref 6.5–8.1)

## 2019-09-13 LAB — CBC WITH DIFFERENTIAL/PLATELET
Abs Immature Granulocytes: 0.04 10*3/uL (ref 0.00–0.07)
Basophils Absolute: 0.1 10*3/uL (ref 0.0–0.1)
Basophils Relative: 1 %
Eosinophils Absolute: 0.3 10*3/uL (ref 0.0–0.5)
Eosinophils Relative: 2 %
HCT: 42.8 % (ref 39.0–52.0)
Hemoglobin: 14.6 g/dL (ref 13.0–17.0)
Immature Granulocytes: 0 %
Lymphocytes Relative: 23 %
Lymphs Abs: 2.8 10*3/uL (ref 0.7–4.0)
MCH: 31.8 pg (ref 26.0–34.0)
MCHC: 34.1 g/dL (ref 30.0–36.0)
MCV: 93.2 fL (ref 80.0–100.0)
Monocytes Absolute: 1.2 10*3/uL — ABNORMAL HIGH (ref 0.1–1.0)
Monocytes Relative: 10 %
Neutro Abs: 8.1 10*3/uL — ABNORMAL HIGH (ref 1.7–7.7)
Neutrophils Relative %: 64 %
Platelets: 277 10*3/uL (ref 150–400)
RBC: 4.59 MIL/uL (ref 4.22–5.81)
RDW: 13.2 % (ref 11.5–15.5)
WBC: 12.6 10*3/uL — ABNORMAL HIGH (ref 4.0–10.5)
nRBC: 0 % (ref 0.0–0.2)

## 2019-09-13 LAB — LACTATE DEHYDROGENASE: LDH: 127 U/L (ref 98–192)

## 2019-09-13 NOTE — Assessment & Plan Note (Addendum)
Status post splenectomy;  clinically no evidence of recurrence-CBC within normal limits except for mildly elevated white count [see discussion below].   # Prostate cancer- with recurrence in 2014 in the prostate bed status post radiation; followed by [facility] urology. STABLE.   # radiation cyctitis- s/p HBO-improved; [ELMIRON) 100 mg ]. STABLE.   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 STABLE.   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   # DISPOSITION: # Follow up in 12 months-MD/labs-cbc/cmp/ldh- Dr.B

## 2019-09-13 NOTE — Progress Notes (Signed)
[facility] OFFICE PROGRESS NOTE  Patient Care Team: Marisue Ivan, MD as PCP - General (Family Medicine)  Cancer Staging CA of prostate [facility]) Staging form: Prostate, AJCC 7th Edition - Clinical: No stage assigned - Unsigned    Oncology History Overview Note  thrombocytopenia, transient neutropenia enlarged spleen for several years.patient was being followed at [facility]. 2.Bone marrow biopsy (October, 2012)  No diagnostic features suggestive of myeloproliferative disease.   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  Recent biopsy from right parotid gland because of abnormality on a PET scan (December, 2016) was negative for any lymphomatous involvement [Dr.Bennett]  # PROSTATE CANCER [Jan 2003];s/p RT [Aug 2015]- PSA [Dr.Davis; [facility]; [place]]; Radiation cystitis s/p HBO [march 2002]   # Parkinson disease [[id number],6840- Dr.Shah]   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HCC)      INTERVAL HISTORY:  Chad Sandoval 74 y.o.  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  Patient denies any new onset of weight loss or nausea vomiting abdominal pain.  No lumps or bumps.  States his Parkinson's is stable.  Denies any major falls.  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  Overall stable.  Review of Systems  Constitutional: Positive for malaise/fatigue. Negative for chills, diaphoresis, fever and weight loss.  HENT: Negative for nosebleeds and sore throat.   Eyes: Negative for double vision.  Respiratory: Negative for cough, hemoptysis,  sputum production, shortness of breath and wheezing.   Cardiovascular: Negative for chest pain, palpitations, orthopnea and leg swelling.  Gastrointestinal: Negative for abdominal pain, blood in stool, constipation, diarrhea, heartburn, melena, nausea and vomiting.  Genitourinary: Negative for dysuria, frequency and urgency.  Musculoskeletal: Negative for back pain and joint pain.  Skin: Negative.  Negative for itching and rash.  Neurological: Negative for dizziness, tingling, focal weakness, weakness and headaches.  Endo/Heme/Allergies: Does not bruise/bleed easily.  Psychiatric/Behavioral: Negative for depression. The patient is not nervous/anxious and does not have insomnia.       PAST MEDICAL HISTORY :  Past Medical History:  Diagnosis Date  . History of radiation therapy 11/03/13- 12/19/13   prostate bed 6600 cGy 33 sessions  . Hypertension    controlled with meds;   Marland Kitchen 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HCC) 07/28/2014  . Parkinson disease (HCC) 2016  . Prostate cancer [facility]) 2003   s/p radical prostatectomy  . Thrombocytopenia (HCC)     PAST SURGICAL HISTORY :   Past Surgical History:  Procedure Laterality Date  . RETROPUBIC PROSTATECTOMY  03/02/2001   Gleason 7  . SPLENECTOMY  04/2012   [facility]    FAMILY HISTORY :   Family History  Problem Relation Age of Onset  . Cancer Father        prostate  . Alzheimer's disease Mother     SOCIAL HISTORY:   Social History   Tobacco Use  . Smoking status: Never Smoker  . Smokeless tobacco: Never Used  Substance Use Topics  . Alcohol use: No  . Drug use: No    ALLERGIES:  is allergic to niacin and oxycontin [oxycodone hcl].  MEDICATIONS:  Current Outpatient Medications  Medication Sig Dispense Refill  . carbidopa-levodopa (SINEMET IR) 25-100 MG tablet Take 2 tablets by mouth 4 (four) times daily.     . Cholecalciferol (VITAMIN D3) 2000 units TABS Take 2,000 Units by mouth daily.     Marland Kitchen gabapentin (NEURONTIN)  300 MG capsule Take 300 mg by mouth 2 (two) times daily.     Marland Kitchen lisinopril (PRINIVIL,ZESTRIL) 10 MG tablet Take 10 mg by mouth daily.     Marland Kitchen lovastatin (MEVACOR) 20 MG tablet TAKE 1 TABLET BY MOUTH EVERY DAY AT NIGHT    . Multiple Vitamin (MULTI-VITAMINS) TABS Take 1 tablet by mouth daily.     . pentosan polysulfate (ELMIRON) 100 MG capsule TAKE 1 CAPSULE (100 MG TOTAL) BY MOUTH 3 TIMES DAILY BEFORE MEALS.    . sildenafil (REVATIO) 20 MG tablet Take 1-5 tablets by mouth as needed (erectile dysfucntion).   3  . sildenafil (VIAGRA) 100 MG tablet Take 100 mg by mouth daily as needed for erectile dysfunction.    Marland Kitchen venlafaxine (EFFEXOR) 37.5 MG tablet Take 37.5 mg by mouth 2 (two) times daily.    Marland Kitchen rOPINIRole (REQUIP) 0.25 MG tablet Take by mouth.     No current facility-administered medications for this visit.    PHYSICAL EXAMINATION: ECOG PERFORMANCE STATUS: 0 - Asymptomatic  BP (!) 147/87 (BP Location: Left Arm, Patient Position: Sitting)   Pulse 84   Temp 98.2 F (36.8 C) (Tympanic)   Resp 16   Wt 230 lb 6.4 oz (104.5 kg)   SpO2 97%   BMI 35.03 kg/m   Filed Weights   09/13/19 0952  Weight: 230 lb 6.4 oz (104.5 kg)    Physical Exam HENT:     Head: Normocephalic and atraumatic.     Mouth/Throat:     Pharynx: No oropharyngeal exudate.  Eyes:     Pupils: Pupils are equal, round, and reactive to light.  Cardiovascular:     Rate and Rhythm: Normal rate and regular rhythm.  Pulmonary:     Effort: Pulmonary effort is normal. No respiratory distress.     Breath sounds: Normal breath sounds. No wheezing.  Abdominal:     General: Bowel sounds are normal. There is no distension.     Palpations: Abdomen is soft. There is no mass.     Tenderness: There is no abdominal tenderness. There is no guarding or rebound.  Musculoskeletal:        General: No tenderness. Normal range of motion.     Cervical back: Normal range of motion and neck supple.  Skin:    General: Skin is warm.   Neurological:     Mental Status: He is alert and oriented to person, place, and time.  Psychiatric:        Mood and Affect: Affect normal.      LABORATORY DATA:  I have reviewed the data as listed    Component Value Date/Time   NA 141 09/13/2019 0929   NA 143 01/25/2014 1128   K 4.2 09/13/2019 0929   K 4.3 01/25/2014 1128   CL 106 09/13/2019 0929   CL 106 01/25/2014 1128   CO2 27 09/13/2019 0929   CO2 29 01/25/2014 1128   GLUCOSE 108 (H) 09/13/2019 0929   GLUCOSE 111 (H) 01/25/2014 1128   BUN 22 09/13/2019 0929   BUN 14 01/25/2014 1128   CREATININE 1.10 09/13/2019 0929   CREATININE 1.03 01/25/2014 1128   CALCIUM 8.9 09/13/2019 0929   CALCIUM  9.5 01/25/2014 1128   PROT 6.7 09/13/2019 0929   PROT 6.8 01/25/2014 1128   ALBUMIN 4.4 09/13/2019 0929   ALBUMIN 4.1 01/25/2014 1128   AST 23 09/13/2019 0929   AST 22 01/25/2014 1128   ALT <5 09/13/2019 0929   ALT 43 01/25/2014 1128   ALKPHOS 83 09/13/2019 0929   ALKPHOS 114 01/25/2014 1128   BILITOT 0.9 09/13/2019 0929   BILITOT 0.7 01/25/2014 1128   GFRNONAA >60 09/13/2019 0929   GFRNONAA >60 01/25/2014 1128   GFRNONAA >60 07/13/2013 0951   GFRAA >60 09/13/2019 0929   GFRAA >60 01/25/2014 1128   GFRAA >60 07/13/2013 0951    No results found for: SPEP, UPEP  Lab Results  Component Value Date   WBC 12.6 (H) 09/13/2019   NEUTROABS 8.1 (H) 09/13/2019   HGB 14.6 09/13/2019   HCT 42.8 09/13/2019   MCV 93.2 09/13/2019   PLT 277 09/13/2019      Chemistry      Component Value Date/Time   NA 141 09/13/2019 0929   NA 143 01/25/2014 1128   K 4.2 09/13/2019 0929   K 4.3 01/25/2014 1128   CL 106 09/13/2019 0929   CL 106 01/25/2014 1128   CO2 27 09/13/2019 0929   CO2 29 01/25/2014 1128   BUN 22 09/13/2019 0929   BUN 14 01/25/2014 1128   CREATININE 1.10 09/13/2019 0929   CREATININE 1.03 01/25/2014 1128      Component Value Date/Time   CALCIUM 8.9 09/13/2019 0929   CALCIUM 9.5 01/25/2014 1128   ALKPHOS 83  09/13/2019 0929   ALKPHOS 114 01/25/2014 1128   AST 23 09/13/2019 0929   AST 22 01/25/2014 1128   ALT <5 09/13/2019 0929   ALT 43 01/25/2014 1128   BILITOT 0.9 09/13/2019 0929   BILITOT 0.7 01/25/2014 1128       RADIOGRAPHIC STUDIES: I have personally reviewed the radiological images as listed and agreed with the findings in the report. No results found.   ASSESSMENT & PLAN:  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[place]) Status post splenectomy;  clinically no evidence of recurrence-CBC within normal limits except for mildly elevated white count [see discussion below].   # Prostate cancer- with recurrence in 2014 in the prostate bed status post radiation; followed by [facility] urology. STABLE.   # radiation cyctitis- s/p HBO-improved; [ELMIRON) 100 mg ]. STABLE.   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 STABLE.   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   # DISPOSITION: # Follow up in 12 months-MD/labs-cbc/cmp/ldh- Dr.B    Orders Placed This Encounter  Procedures  . CBC with Differential/Platelet    Standing Status:   Future    Standing Expiration Date:   09/12/2020  . Comprehensive metabolic panel    Standing Status:   Future    Standing Expiration Date:   09/12/2020  . Lactate dehydrogenase    Standing Status:   Future    Standing Expiration Date:   09/12/2020   All questions were answered. The patient knows to call the clinic with any problems, questions or concerns.      Cammie Sickle, MD 09/13/2019 2:49 PM

## 2020-09-12 ENCOUNTER — Encounter: Payer: Self-pay | Admitting: Internal Medicine

## 2020-09-12 ENCOUNTER — Inpatient Hospital Stay: Payer: Medicare PPO | Admitting: Internal Medicine

## 2020-09-12 ENCOUNTER — Inpatient Hospital Stay: Payer: Medicare PPO | Attending: Internal Medicine

## 2020-09-12 DIAGNOSIS — Z8042 Family history of malignant neoplasm of prostate: Secondary | ICD-10-CM | POA: Diagnosis not present

## 2020-09-12 DIAGNOSIS — C8307 Small cell B-cell lymphoma, spleen: Secondary | ICD-10-CM

## 2020-09-12 DIAGNOSIS — Z8572 Personal history of non-Hodgkin lymphomas: Secondary | ICD-10-CM | POA: Insufficient documentation

## 2020-09-12 DIAGNOSIS — Z9081 Acquired absence of spleen: Secondary | ICD-10-CM | POA: Diagnosis not present

## 2020-09-12 DIAGNOSIS — Z79899 Other long term (current) drug therapy: Secondary | ICD-10-CM | POA: Insufficient documentation

## 2020-09-12 DIAGNOSIS — Z9079 Acquired absence of other genital organ(s): Secondary | ICD-10-CM | POA: Insufficient documentation

## 2020-09-12 DIAGNOSIS — N3040 Irradiation cystitis without hematuria: Secondary | ICD-10-CM | POA: Insufficient documentation

## 2020-09-12 DIAGNOSIS — D72821 Monocytosis (symptomatic): Secondary | ICD-10-CM | POA: Diagnosis not present

## 2020-09-12 DIAGNOSIS — Z923 Personal history of irradiation: Secondary | ICD-10-CM | POA: Diagnosis not present

## 2020-09-12 DIAGNOSIS — Z8546 Personal history of malignant neoplasm of prostate: Secondary | ICD-10-CM | POA: Diagnosis present

## 2020-09-12 DIAGNOSIS — G20 Parkinson's disease: Secondary | ICD-10-CM | POA: Diagnosis not present

## 2020-09-12 LAB — CBC WITH DIFFERENTIAL/PLATELET
Abs Immature Granulocytes: 0.04 10*3/uL (ref 0.00–0.07)
Basophils Absolute: 0.1 10*3/uL (ref 0.0–0.1)
Basophils Relative: 1 %
Eosinophils Absolute: 0.2 10*3/uL (ref 0.0–0.5)
Eosinophils Relative: 2 %
HCT: 45.9 % (ref 39.0–52.0)
Hemoglobin: 15.2 g/dL (ref 13.0–17.0)
Immature Granulocytes: 0 %
Lymphocytes Relative: 26 %
Lymphs Abs: 3.1 10*3/uL (ref 0.7–4.0)
MCH: 31.2 pg (ref 26.0–34.0)
MCHC: 33.1 g/dL (ref 30.0–36.0)
MCV: 94.3 fL (ref 80.0–100.0)
Monocytes Absolute: 1.2 10*3/uL — ABNORMAL HIGH (ref 0.1–1.0)
Monocytes Relative: 10 %
Neutro Abs: 7.1 10*3/uL (ref 1.7–7.7)
Neutrophils Relative %: 61 %
Platelets: 246 10*3/uL (ref 150–400)
RBC: 4.87 MIL/uL (ref 4.22–5.81)
RDW: 13.6 % (ref 11.5–15.5)
WBC: 11.8 10*3/uL — ABNORMAL HIGH (ref 4.0–10.5)
nRBC: 0 % (ref 0.0–0.2)

## 2020-09-12 LAB — COMPREHENSIVE METABOLIC PANEL
ALT: 5 U/L (ref 0–44)
AST: 19 U/L (ref 15–41)
Albumin: 4.6 g/dL (ref 3.5–5.0)
Alkaline Phosphatase: 94 U/L (ref 38–126)
Anion gap: 8 (ref 5–15)
BUN: 28 mg/dL — ABNORMAL HIGH (ref 8–23)
CO2: 27 mmol/L (ref 22–32)
Calcium: 9.4 mg/dL (ref 8.9–10.3)
Chloride: 104 mmol/L (ref 98–111)
Creatinine, Ser: 1.16 mg/dL (ref 0.61–1.24)
GFR, Estimated: >60 mL/min (ref >60)
Glucose, Bld: 113 mg/dL — ABNORMAL HIGH (ref 70–99)
Potassium: 5 mmol/L (ref 3.5–5.1)
Sodium: 139 mmol/L (ref 135–145)
Total Bilirubin: 0.7 mg/dL (ref 0.3–1.2)
Total Protein: 7 g/dL (ref 6.5–8.1)

## 2020-09-12 LAB — LACTATE DEHYDROGENASE: LDH: 103 U/L (ref 98–192)

## 2020-09-12 NOTE — Progress Notes (Signed)
[place] OFFICE PROGRESS NOTE  Patient Care Team: Dion Body, MD as PCP - General (Family Medicine)  Cancer Staging CA of prostate [facility]) Staging form: Prostate, AJCC 7th Edition - Clinical: No stage assigned - Unsigned Gleason score: 0    Oncology History Overview Note  thrombocytopenia, transient neutropenia enlarged spleen for several years.patient was being followed at [facility]. 2.Bone marrow biopsy (October, 2012)  No diagnostic features suggestive of myeloproliferative disease.   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  Recent biopsy from right parotid gland because of abnormality on a PET scan (December, 2016) was negative for any lymphomatous involvement [Dr.Bennett]  # PROSTATE CANCER [Jan 2003];s/p RT [Aug 2015]- PSA [Dr.Davis; [facility]; [place]]; Radiation cystitis s/p HBO [march 2002]   # Parkinson disease [RSW,5462- Dr.Shah]   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[place])      INTERVAL HISTORY:  Chad Sandoval 75 y.o.  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  Patient continues to follow-up with Dr. Brigitte Pulse for his Parkinson's disease.  His fairly stable.  He also continues to follow-up with urology at [facility] for his prostate cancer/and radiation-induced cystitis.  Patient denies any new onset of weight loss or nausea vomiting abdominal pain.  No lumps or bumps.  Review of Systems  Constitutional:  Positive for malaise/fatigue. Negative for chills, diaphoresis, fever and weight loss.  HENT:  Negative for nosebleeds and sore throat.   Eyes:   Negative for double vision.  Respiratory:  Negative for cough, hemoptysis, sputum production, shortness of breath and wheezing.   Cardiovascular:  Negative for chest pain, palpitations, orthopnea and leg swelling.  Gastrointestinal:  Negative for abdominal pain, blood in stool, constipation, diarrhea, heartburn, melena, nausea and vomiting.  Genitourinary:  Negative for dysuria, frequency and urgency.  Musculoskeletal:  Negative for back pain and joint pain.  Skin: Negative.  Negative for itching and rash.  Neurological:  Negative for dizziness, tingling, focal weakness, weakness and headaches.  Endo/Heme/Allergies:  Does not bruise/bleed easily.  Psychiatric/Behavioral:  Negative for depression. The patient is not nervous/anxious and does not have insomnia.      PAST MEDICAL HISTORY :  Past Medical History:  Diagnosis Date  . History of radiation therapy 11/03/13- 12/19/13   prostate bed 6600 cGy 33 sessions  . Hypertension    controlled with meds;   Marland Kitchen 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[place]) 07/28/2014  . Parkinson disease ([place]) 2016  . Prostate cancer [facility]) 2003   s/p radical prostatectomy  . Thrombocytopenia ([place])     PAST SURGICAL HISTORY :   Past Surgical History:  Procedure Laterality Date  . RETROPUBIC PROSTATECTOMY  03/02/2001   Gleason 7  . SPLENECTOMY  04/2012   [facility]    FAMILY HISTORY :   Family History  Problem Relation Age of Onset  . Cancer Father        prostate  . Alzheimer's disease Mother     SOCIAL HISTORY:   Social History   Tobacco Use  . Smoking status: Never  . Smokeless tobacco: Never  Substance Use Topics  . Alcohol use:  No  . Drug use: No    ALLERGIES:  is allergic to niacin and oxycontin [oxycodone hcl].  MEDICATIONS:  Current Outpatient Medications  Medication Sig Dispense Refill  . carbidopa-levodopa (SINEMET CR) 50-200 MG tablet Take 1 tablet by mouth at bedtime.    . carbidopa-levodopa (SINEMET IR) 25-100 MG tablet Take  2 tablets by mouth 4 (four) times daily.     . Cholecalciferol (VITAMIN D3) 2000 units TABS Take 2,000 Units by mouth daily.     Marland Kitchen gabapentin (NEURONTIN) 300 MG capsule Take 300 mg by mouth 2 (two) times daily.     Marland Kitchen lisinopril (PRINIVIL,ZESTRIL) 10 MG tablet Take 5 mg by mouth daily.    Marland Kitchen lovastatin (MEVACOR) 20 MG tablet TAKE 1 TABLET BY MOUTH EVERY DAY AT NIGHT    . Multiple Vitamin (MULTI-VITAMINS) TABS Take 1 tablet by mouth daily.     . pentosan polysulfate (ELMIRON) 100 MG capsule TAKE 1 CAPSULE (100 MG TOTAL) BY MOUTH 3 TIMES DAILY BEFORE MEALS.    Marland Kitchen rOPINIRole (REQUIP) 0.25 MG tablet Take by mouth.    . sildenafil (REVATIO) 20 MG tablet Take 1-5 tablets by mouth as needed (erectile dysfucntion).   3  . sildenafil (VIAGRA) 100 MG tablet Take 100 mg by mouth daily as needed for erectile dysfunction.    Marland Kitchen venlafaxine (EFFEXOR) 37.5 MG tablet Take 37.5 mg by mouth 2 (two) times daily.     No current facility-administered medications for this visit.    PHYSICAL EXAMINATION: ECOG PERFORMANCE STATUS: 0 - Asymptomatic  BP 127/74 (BP Location: Left Arm, Patient Position: Sitting)   Pulse 70   Temp (!) 96.1 F (35.6 C) (Tympanic)   Wt 215 lb 3.2 oz (97.6 kg)   SpO2 97%   BMI 32.72 kg/m   Filed Weights   09/12/20 1051  Weight: 215 lb 3.2 oz (97.6 kg)    Physical Exam HENT:     Head: Normocephalic and atraumatic.     Mouth/Throat:     Pharynx: No oropharyngeal exudate.  Eyes:     Pupils: Pupils are equal, round, and reactive to light.  Cardiovascular:     Rate and Rhythm: Normal rate and regular rhythm.  Pulmonary:     Effort: Pulmonary effort is normal. No respiratory distress.     Breath sounds: Normal breath sounds. No wheezing.  Abdominal:     General: Bowel sounds are normal. There is no distension.     Palpations: Abdomen is soft. There is no mass.     Tenderness: no abdominal tenderness There is no guarding or rebound.  Musculoskeletal:        General: No  tenderness. Normal range of motion.     Cervical back: Normal range of motion and neck supple.  Skin:    General: Skin is warm.  Neurological:     Mental Status: He is alert and oriented to person, place, and time.  Psychiatric:        Mood and Affect: Affect normal.     LABORATORY DATA:  I have reviewed the data as listed    Component Value Date/Time   NA 139 09/12/2020 0946   NA 143 01/25/2014 1128   K 5.0 09/12/2020 0946   K 4.3 01/25/2014 1128   CL 104 09/12/2020 0946   CL 106 01/25/2014 1128   CO2 27 09/12/2020 0946   CO2 29 01/25/2014 1128   GLUCOSE 113 (H) 09/12/2020 0946   GLUCOSE 111 (H) 01/25/2014 1128   BUN 28 (  H) 09/12/2020 0946   BUN 14 01/25/2014 1128   CREATININE 1.16 09/12/2020 0946   CREATININE 1.03 01/25/2014 1128   CALCIUM 9.4 09/12/2020 0946   CALCIUM 9.5 01/25/2014 1128   PROT 7.0 09/12/2020 0946   PROT 6.8 01/25/2014 1128   ALBUMIN 4.6 09/12/2020 0946   ALBUMIN 4.1 01/25/2014 1128   AST 19 09/12/2020 0946   AST 22 01/25/2014 1128   ALT 5 09/12/2020 0946   ALT 43 01/25/2014 1128   ALKPHOS 94 09/12/2020 0946   ALKPHOS 114 01/25/2014 1128   BILITOT 0.7 09/12/2020 0946   BILITOT 0.7 01/25/2014 1128   GFRNONAA >60 09/12/2020 0946   GFRNONAA >60 01/25/2014 1128   GFRNONAA >60 07/13/2013 0951   GFRAA >60 09/13/2019 0929   GFRAA >60 01/25/2014 1128   GFRAA >60 07/13/2013 0951    No results found for: SPEP, UPEP  Lab Results  Component Value Date   WBC 11.8 (H) 09/12/2020   NEUTROABS 7.1 09/12/2020   HGB 15.2 09/12/2020   HCT 45.9 09/12/2020   MCV 94.3 09/12/2020   PLT 246 09/12/2020      Chemistry      Component Value Date/Time   NA 139 09/12/2020 0946   NA 143 01/25/2014 1128   K 5.0 09/12/2020 0946   K 4.3 01/25/2014 1128   CL 104 09/12/2020 0946   CL 106 01/25/2014 1128   CO2 27 09/12/2020 0946   CO2 29 01/25/2014 1128   BUN 28 (H) 09/12/2020 0946   BUN 14 01/25/2014 1128   CREATININE 1.16 09/12/2020 0946   CREATININE 1.03  01/25/2014 1128      Component Value Date/Time   CALCIUM 9.4 09/12/2020 0946   CALCIUM 9.5 01/25/2014 1128   ALKPHOS 94 09/12/2020 0946   ALKPHOS 114 01/25/2014 1128   AST 19 09/12/2020 0946   AST 22 01/25/2014 1128   ALT 5 09/12/2020 0946   ALT 43 01/25/2014 1128   BILITOT 0.7 09/12/2020 0946   BILITOT 0.7 01/25/2014 1128       RADIOGRAPHIC STUDIES: I have personally reviewed the radiological images as listed and agreed with the findings in the report. No results found.   ASSESSMENT & PLAN:  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[place]) Status post splenectomy;  clinically no evidence of recurrence-CBC within normal limits except for mildly elevated white count [see discussion below].   # Prostate cancer- with recurrence in 2014 in the prostate bed status post radiation; followed by [facility] urology- STABLE.   # radiation cyctitis- s/p HBO-improved; [ELMIRON) 100 mg ]- STABLE.   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   #Discussed ongoing follow-up with the patient: Patient has been diagnosed with his malignancy over 10 years ago; no recurrence.  Patient does not need any oncology related work-up/surveillance-CBC/every 6 months would be reasonable.  With his multiple other comorbidities/close follow-up with PCP and other doctors I think is reasonable to follow-up with PCP.  Follow-up with [place] as needed.  # DISPOSITION: # Follow up as needed- Dr.B   No orders of the defined types were placed in this encounter.  All questions were answered. The patient knows to call the clinic with any problems, questions or concerns.      Cammie Sickle, MD 09/12/2020 2:58 PM

## 2020-09-12 NOTE — Assessment & Plan Note (Addendum)
Status post splenectomy;  clinically no evidence of recurrence-CBC within normal limits except for mildly elevated white count [see discussion below].   # Prostate cancer- with recurrence in 2014 in the prostate bed status post radiation; followed by [facility] urology- STABLE.   # radiation cyctitis- s/p HBO-improved; [ELMIRON) 100 mg ]- STABLE.   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   #Discussed ongoing follow-up with the patient: Patient has been diagnosed with his malignancy over 10 years ago; no recurrence.  Patient does not need any oncology related work-up/surveillance-CBC/every 6 months would be reasonable.  With his multiple other comorbidities/close follow-up with PCP and other doctors I think is reasonable to follow-up with PCP.  Follow-up with [place] as needed.  # DISPOSITION: # Follow up as needed- Dr.B

## 2020-09-12 NOTE — Progress Notes (Signed)
Yearly follow up visit. No concerns. Unfortunately has parkinson's and he is doing his best handling that.

## 2021-05-22 ENCOUNTER — Other Ambulatory Visit: Payer: Self-pay | Admitting: Family Medicine

## 2021-05-22 DIAGNOSIS — M5412 Radiculopathy, cervical region: Secondary | ICD-10-CM

## 2021-05-29 ENCOUNTER — Other Ambulatory Visit: Payer: Self-pay | Admitting: Family Medicine

## 2021-05-29 DIAGNOSIS — M5030 Other cervical disc degeneration, unspecified cervical region: Secondary | ICD-10-CM

## 2021-05-29 DIAGNOSIS — M5412 Radiculopathy, cervical region: Secondary | ICD-10-CM

## 2021-06-05 ENCOUNTER — Ambulatory Visit
Admission: RE | Admit: 2021-06-05 | Discharge: 2021-06-05 | Disposition: A | Discharge status: Home / Self Care | Payer: Medicare PPO | Source: Ambulatory Visit | Attending: Family Medicine | Admitting: Family Medicine

## 2021-06-05 DIAGNOSIS — M5030 Other cervical disc degeneration, unspecified cervical region: Secondary | ICD-10-CM

## 2021-06-05 DIAGNOSIS — M5412 Radiculopathy, cervical region: Secondary | ICD-10-CM

## 2022-04-22 ENCOUNTER — Other Ambulatory Visit: Payer: Self-pay | Admitting: Physician Assistant

## 2022-04-22 ENCOUNTER — Ambulatory Visit
Admission: RE | Admit: 2022-04-22 | Discharge: 2022-04-22 | Disposition: A | Discharge status: Home / Self Care | Payer: Medicare PPO | Source: Ambulatory Visit | Attending: Physician Assistant | Admitting: Physician Assistant

## 2022-04-22 DIAGNOSIS — M7989 Other specified soft tissue disorders: Secondary | ICD-10-CM

## 2022-06-11 ENCOUNTER — Ambulatory Visit: Payer: Medicare PPO | Admitting: Dermatology

## 2022-06-11 ENCOUNTER — Encounter: Payer: Self-pay | Admitting: Dermatology

## 2022-06-11 VITALS — BP 144/96

## 2022-06-11 DIAGNOSIS — L578 Other skin changes due to chronic exposure to nonionizing radiation: Secondary | ICD-10-CM

## 2022-06-11 DIAGNOSIS — Z79899 Other long term (current) drug therapy: Secondary | ICD-10-CM

## 2022-06-11 DIAGNOSIS — L570 Actinic keratosis: Secondary | ICD-10-CM | POA: Diagnosis not present

## 2022-06-11 DIAGNOSIS — L820 Inflamed seborrheic keratosis: Secondary | ICD-10-CM | POA: Diagnosis not present

## 2022-06-11 DIAGNOSIS — L814 Other melanin hyperpigmentation: Secondary | ICD-10-CM | POA: Diagnosis not present

## 2022-06-11 DIAGNOSIS — L821 Other seborrheic keratosis: Secondary | ICD-10-CM | POA: Diagnosis not present

## 2022-06-11 DIAGNOSIS — L219 Seborrheic dermatitis, unspecified: Secondary | ICD-10-CM

## 2022-06-11 MED ORDER — KETOCONAZOLE 2 % EX SHAM: 1.0000 | MEDICATED_SHAMPOO | CUTANEOUS | 11 refills | Status: DC

## 2022-06-11 NOTE — Progress Notes (Signed)
New Patient Visit   Subjective  Chad Sandoval is a 77 y.o. male who presents for the following: check spot scalp, yrs, scaly The patient has spots, moles and lesions to be evaluated, some may be new or changing and the patient may have concern these could be cancer.  Patient accompanied by wife who contributes to history.  The following portions of the chart were reviewed this encounter and updated as appropriate: medications, allergies, medical history  Review of Systems:  No other skin or systemic complaints except as noted in HPI or Assessment and Plan.  Objective  Well appearing patient in no apparent distress; mood and affect are within normal limits. A focused examination was performed of the following areas: Face, scalp Relevant exam findings are noted in the Assessment and Plan.  scalp x 1, R ear x 3 (4) Pink scaly macules  Scalp x 1 Stuck on waxy paps with erythema   Assessment & Plan   SEBORRHEIC DERMATITIS Exam: Pink patches with greasy scale at scalp Seborrheic Dermatitis is a chronic persistent rash characterized by pinkness and scaling most commonly of the mid face but also can occur on the scalp (dandruff), ears; mid chest, mid back and groin.  It tends to be exacerbated by stress and cooler weather.  People who have neurologic disease may experience new onset or exacerbation of existing seborrheic dermatitis.  The condition is not curable but treatable and can be controlled.  Treatment Plan: Start Ketoconazole 2% shampoo 2-3x/wk, let sit 5 minutes and rinse off   SEBORRHEIC KERATOSIS - Stuck-on, waxy, tan-brown papules and/or plaques  - Benign-appearing - Discussed benign etiology and prognosis. - Observe - Call for any changes  LENTIGINES Exam: scattered tan macules Due to sun exposure Treatment Plan: Benign-appearing, observe. Recommend daily broad spectrum sunscreen SPF 30+ to sun-exposed areas, reapply every 2 hours as needed.  Call for any  changes  ACTINIC DAMAGE - chronic, secondary to cumulative UV radiation exposure/sun exposure over time - diffuse scaly erythematous macules with underlying dyspigmentation - Recommend daily broad spectrum sunscreen SPF 30+ to sun-exposed areas, reapply every 2 hours as needed.  - Recommend staying in the shade or wearing long sleeves, sun glasses (UVA+UVB protection) and wide brim hats (4-inch brim around the entire circumference of the hat). - Call for new or changing lesions.   AK (actinic keratosis) (4) scalp x 1, R ear x 3  Destruction of lesion - scalp x 1, R ear x 3 Complexity: simple   Destruction method: cryotherapy   Informed consent: discussed and consent obtained   Timeout:  patient name, date of birth, surgical site, and procedure verified Lesion destroyed using liquid nitrogen: Yes   Region frozen until ice ball extended beyond lesion: Yes   Outcome: patient tolerated procedure well with no complications   Post-procedure details: wound care instructions given    Inflamed seborrheic keratosis Scalp x 1  Symptomatic, irritating, patient would like treated.   Destruction of lesion - Scalp x 1 Complexity: simple   Destruction method: cryotherapy   Informed consent: discussed and consent obtained   Timeout:  patient name, date of birth, surgical site, and procedure verified Lesion destroyed using liquid nitrogen: Yes   Region frozen until ice ball extended beyond lesion: Yes   Outcome: patient tolerated procedure well with no complications   Post-procedure details: wound care instructions given     Return in about 1 year (around 06/11/2023).  I, Ardis Rowan, RMA, am acting as scribe for  Armida Sans, MD .  Documentation: I have reviewed the above documentation for accuracy and completeness, and I agree with the above.  Armida Sans, MD

## 2022-06-11 NOTE — Patient Instructions (Addendum)
Cryotherapy Aftercare  Wash gently with soap and water everyday.   Apply Vaseline and Band-Aid daily until healed.     Due to recent changes in healthcare laws, you may see results of your pathology and/or laboratory studies on MyChart before the doctors have had a chance to review them. We understand that in some cases there may be results that are confusing or concerning to you. Please understand that not all results are received at the same time and often the doctors may need to interpret multiple results in order to provide you with the best plan of care or course of treatment. Therefore, we ask that you please give us 2 business days to thoroughly review all your results before contacting the office for clarification. Should we see a critical lab result, you will be contacted sooner.   If You Need Anything After Your Visit  If you have any questions or concerns for your doctor, please call our main line at [phone] and press option 4 to reach your doctor's medical assistant. If no one answers, please leave a voicemail as directed and we will return your call as soon as possible. Messages left after 4 pm will be answered the following business day.   You may also send us a message via MyChart. We typically respond to MyChart messages within 1-2 business days.  For prescription refills, please ask your pharmacy to contact our office. Our fax number is [phone].  If you have an urgent issue when the clinic is closed that cannot wait until the next business day, you can page your doctor at the number below.    Please note that while we do our best to be available for urgent issues outside of office hours, we are not available 24/7.   If you have an urgent issue and are unable to reach us, you may choose to seek medical care at your doctor's office, retail clinic, urgent care center, or emergency room.  If you have a medical emergency, please immediately call 911 or go to the  emergency department.  Pager Numbers  - Dr. Kowalski: [phone]  - Dr. Moye: [phone]  - Dr. Stewart: [phone]  In the event of inclement weather, please call our main line at [phone] for an update on the status of any delays or closures.  Dermatology Medication Tips: Please keep the boxes that topical medications come in in order to help keep track of the instructions about where and how to use these. Pharmacies typically print the medication instructions only on the boxes and not directly on the medication tubes.   If your medication is too expensive, please contact our office at [phone] option 4 or send us a message through MyChart.   We are unable to tell what your co-pay for medications will be in advance as this is different depending on your insurance coverage. However, we may be able to find a substitute medication at lower cost or fill out paperwork to get insurance to cover a needed medication.   If a prior authorization is required to get your medication covered by your insurance company, please allow us 1-2 business days to complete this process.  Drug prices often vary depending on where the prescription is filled and some pharmacies may offer cheaper prices.  The website www.goodrx.com contains coupons for medications through different pharmacies. The prices here do not account for what the cost may be with help from insurance (it may be cheaper with your insurance), but the website can   give you the price if you did not use any insurance.  - You can print the associated coupon and take it with your prescription to the pharmacy.  - You may also stop by our office during regular business hours and pick up a GoodRx coupon card.  - If you need your prescription sent electronically to a different pharmacy, notify our office through [facility] MyChart or by phone at [phone] option 4.     Si Usted Necesita Algo Despus de Su Visita  Tambin puede  enviarnos un mensaje a travs de MyChart. Por lo general respondemos a los mensajes de MyChart en el transcurso de 1 a 2 das hbiles.  Para renovar recetas, por favor pida a su farmacia que se ponga en contacto con nuestra oficina. Nuestro nmero de fax es el [phone].  Si tiene un asunto urgente cuando la clnica est cerrada y que no puede esperar hasta el siguiente da hbil, puede llamar/localizar a su doctor(a) al nmero que aparece a continuacin.   Por favor, tenga en cuenta que aunque hacemos todo lo posible para estar disponibles para asuntos urgentes fuera del horario de oficina, no estamos disponibles las 24 horas del da, los 7 das de la semana.   Si tiene un problema urgente y no puede comunicarse con nosotros, puede optar por buscar atencin mdica  en el consultorio de su doctor(a), en una clnica privada, en un centro de atencin urgente o en una sala de emergencias.  Si tiene una emergencia mdica, por favor llame inmediatamente al 911 o vaya a la sala de emergencias.  Nmeros de bper  - Dr. Kowalski: [phone]  - Dra. Moye: [phone]  - Dra. Stewart: [phone]  En caso de inclemencias del tiempo, por favor llame a nuestra lnea principal al [phone] para una actualizacin sobre el estado de cualquier retraso o cierre.  Consejos para la medicacin en dermatologa: Por favor, guarde las cajas en las que vienen los medicamentos de uso tpico para ayudarle a seguir las instrucciones sobre dnde y cmo usarlos. Las farmacias generalmente imprimen las instrucciones del medicamento slo en las cajas y no directamente en los tubos del medicamento.   Si su medicamento es muy caro, por favor, pngase en contacto con nuestra oficina llamando al [phone] y presione la opcin 4 o envenos un mensaje a travs de MyChart.   No podemos decirle cul ser su copago por los medicamentos por adelantado ya que esto es diferente dependiendo de la cobertura de su seguro.  Sin embargo, es posible que podamos encontrar un medicamento sustituto a menor costo o llenar un formulario para que el seguro cubra el medicamento que se considera necesario.   Si se requiere una autorizacin previa para que su compaa de seguros cubra su medicamento, por favor permtanos de 1 a 2 das hbiles para completar este proceso.  Los precios de los medicamentos varan con frecuencia dependiendo del lugar de dnde se surte la receta y alguna farmacias pueden ofrecer precios ms baratos.  El sitio web www.goodrx.com tiene cupones para medicamentos de diferentes farmacias. Los precios aqu no tienen en cuenta lo que podra costar con la ayuda del seguro (puede ser ms barato con su seguro), pero el sitio web puede darle el precio si no utiliz ningn seguro.  - Puede imprimir el cupn correspondiente y llevarlo con su receta a la farmacia.  - Tambin puede pasar por nuestra oficina durante el horario de atencin regular y recoger una tarjeta de cupones de GoodRx.  -   Si necesita que su receta se enve electrnicamente a una farmacia diferente, informe a nuestra oficina a travs de MyChart de [facility] o por telfono llamando al [phone] y presione la opcin 4.  

## 2022-06-23 ENCOUNTER — Encounter: Payer: Self-pay | Admitting: Dermatology

## 2022-09-13 ENCOUNTER — Emergency Department
Admission: EM | Admit: 2022-09-13 | Discharge: 2022-09-13 | Disposition: A | Discharge status: Home / Self Care | Payer: Medicare PPO | Attending: Emergency Medicine | Admitting: Emergency Medicine

## 2022-09-13 ENCOUNTER — Emergency Department: Payer: Medicare PPO

## 2022-09-13 DIAGNOSIS — Z20822 Contact with and (suspected) exposure to covid-19: Secondary | ICD-10-CM | POA: Insufficient documentation

## 2022-09-13 DIAGNOSIS — G20A1 Parkinson's disease without dyskinesia, without mention of fluctuations: Secondary | ICD-10-CM | POA: Insufficient documentation

## 2022-09-13 DIAGNOSIS — R0602 Shortness of breath: Secondary | ICD-10-CM | POA: Insufficient documentation

## 2022-09-13 DIAGNOSIS — I10 Essential (primary) hypertension: Secondary | ICD-10-CM | POA: Diagnosis not present

## 2022-09-13 DIAGNOSIS — D72829 Elevated white blood cell count, unspecified: Secondary | ICD-10-CM | POA: Diagnosis not present

## 2022-09-13 DIAGNOSIS — E119 Type 2 diabetes mellitus without complications: Secondary | ICD-10-CM | POA: Diagnosis not present

## 2022-09-13 DIAGNOSIS — R600 Localized edema: Secondary | ICD-10-CM

## 2022-09-13 LAB — CBC WITH DIFFERENTIAL/PLATELET
Abs Immature Granulocytes: 0.07 10*3/uL (ref 0.00–0.07)
Basophils Absolute: 0.1 10*3/uL (ref 0.0–0.1)
Basophils Relative: 1 %
Eosinophils Absolute: 0.2 10*3/uL (ref 0.0–0.5)
Eosinophils Relative: 2 %
HCT: 50.1 % (ref 39.0–52.0)
Hemoglobin: 16.2 g/dL (ref 13.0–17.0)
Immature Granulocytes: 1 %
Lymphocytes Relative: 26 %
Lymphs Abs: 3.7 10*3/uL (ref 0.7–4.0)
MCH: 30.9 pg (ref 26.0–34.0)
MCHC: 32.3 g/dL (ref 30.0–36.0)
MCV: 95.4 fL (ref 80.0–100.0)
Monocytes Absolute: 1.4 10*3/uL — ABNORMAL HIGH (ref 0.1–1.0)
Monocytes Relative: 10 %
Neutro Abs: 8.6 10*3/uL — ABNORMAL HIGH (ref 1.7–7.7)
Neutrophils Relative %: 60 %
Platelets: 273 10*3/uL (ref 150–400)
RBC: 5.25 MIL/uL (ref 4.22–5.81)
RDW: 13 % (ref 11.5–15.5)
WBC: 14.2 10*3/uL — ABNORMAL HIGH (ref 4.0–10.5)
nRBC: 0 % (ref 0.0–0.2)

## 2022-09-13 LAB — URINALYSIS, ROUTINE W REFLEX MICROSCOPIC
Bacteria, UA: NONE SEEN
Bilirubin Urine: NEGATIVE
Glucose, UA: NEGATIVE mg/dL
Ketones, ur: NEGATIVE mg/dL
Leukocytes,Ua: NEGATIVE
Nitrite: NEGATIVE
Protein, ur: NEGATIVE mg/dL
Specific Gravity, Urine: 1.005 (ref 1.005–1.030)
Squamous Epithelial / HPF: NONE SEEN /HPF (ref 0–5)
pH: 6 (ref 5.0–8.0)

## 2022-09-13 LAB — COMPREHENSIVE METABOLIC PANEL
ALT: 6 U/L (ref 0–44)
AST: 20 U/L (ref 15–41)
Albumin: 4.2 g/dL (ref 3.5–5.0)
Alkaline Phosphatase: 117 U/L (ref 38–126)
Anion gap: 7 (ref 5–15)
BUN: 18 mg/dL (ref 8–23)
CO2: 27 mmol/L (ref 22–32)
Calcium: 9.2 mg/dL (ref 8.9–10.3)
Chloride: 105 mmol/L (ref 98–111)
Creatinine, Ser: 0.99 mg/dL (ref 0.61–1.24)
GFR, Estimated: >60 mL/min (ref >60)
Glucose, Bld: 152 mg/dL — ABNORMAL HIGH (ref 70–99)
Potassium: 4 mmol/L (ref 3.5–5.1)
Sodium: 139 mmol/L (ref 135–145)
Total Bilirubin: 0.9 mg/dL (ref 0.3–1.2)
Total Protein: 6.7 g/dL (ref 6.5–8.1)

## 2022-09-13 LAB — SARS CORONAVIRUS 2 BY RT PCR: SARS Coronavirus 2 by RT PCR: NEGATIVE

## 2022-09-13 LAB — TROPONIN I (HIGH SENSITIVITY)
Troponin I (High Sensitivity): 6 ng/L (ref <18)
Troponin I (High Sensitivity): 5 ng/L (ref <18)

## 2022-09-13 LAB — BRAIN NATRIURETIC PEPTIDE: B Natriuretic Peptide: 26.1 pg/mL (ref 0.0–100.0)

## 2022-09-13 MED ORDER — FUROSEMIDE 10 MG/ML IJ SOLN
40.0000 mg | Freq: Once | INTRAMUSCULAR | Status: AC
  Administered 2022-09-13: 40 mg via INTRAVENOUS
  Filled 2022-09-13: qty 4

## 2022-09-13 MED ORDER — FUROSEMIDE 20 MG PO TABS: ORAL_TABLET | ORAL | 0 refills | Status: DC

## 2022-09-13 MED ORDER — CARBIDOPA-LEVODOPA 25-100 MG PO TABS
2.0000 | ORAL_TABLET | Freq: Once | ORAL | Status: AC
  Administered 2022-09-13: 2 via ORAL
  Filled 2022-09-13: qty 2

## 2022-09-13 MED ORDER — POTASSIUM CHLORIDE CRYS ER 10 MEQ PO TBCR: 10.0000 meq | EXTENDED_RELEASE_TABLET | ORAL | 0 refills | Status: DC | PRN

## 2022-09-13 NOTE — Discharge Instructions (Signed)
Start taking the lasix daily. For the next 3 days, take once tablet TWICE a day (with a potassium dose), then take once daily.  Follow-up with your primary this week for repeat labs and check-up  Set up an appointment with a Cardiologist as soon as possible

## 2022-09-13 NOTE — ED Provider Notes (Signed)
Patient ambulatory without difficulty or hypoxia.  Clinically, suspect mild CHF and the patient has been on Lasix before.  He does appear hypervolemic on exam.  His symptoms are exertional as well as orthopneic.  Patient given IV Lasix with significant improvement already.  His lab work is otherwise reassuring.  Troponin negative.  No signs of ischemia.  Chest x-ray is clear.  Had a long discussion with the patient and his family.  Will place him back on a scheduled dose of Lasix after a short course of increased dose, as well as give potassium supplementation and close outpatient follow-up.  Will also refer to cardiology for echocardiogram and further workup.   Shaune Pollack, MD 09/13/22 [phone]

## 2022-09-13 NOTE — ED Triage Notes (Signed)
Pt from home via EMS for shortness of breath and frequent urination.  Pt states that SOB is same at rest and w/ exertion.  Pt states urinary frequency started earlier this week and has been intermittent.

## 2022-09-13 NOTE — ED Notes (Signed)
Ambulation trial is completed at this time. Pt HR maintained at 85-95 and O2 stats 95%. Pt denied any SOB or lightheadedness throughout the trial. Provider Dr. Erma Heritage notified.

## 2022-09-13 NOTE — ED Provider Notes (Signed)
[facility] Provider Note    Event Date/Time   First MD Initiated Contact with Patient 09/13/22 [phone]     (approximate)   History   Shortness of Breath   HPI  Chad Sandoval is a 77 y.o. male who presents to the ED for evaluation of Shortness of Breath   Review of PCP visit from 6/3.  History of obesity, DM, HTN, Parkinson's.  Furosemide on his medication list but I see no echoes or documented heart failure.  Patient presents to the ED via EMS from home for the ration a couple hours of acute dyspnea and orthopnea superimposed on a couple days of malaise and possible urinary frequency.  Reports malaise for couple days and not feeling well in a generalized fashion.  No pain, dyspnea or particular symptoms.  Reports waking up with shortness of breath just in the past couple hours this morning but reports feeling okay when going to sleep last night.  No longer taking Lasix, for the past couple months, was taking it for symptomatic leg swelling in the past.  EMS reports sats of 88-89% on room air, but he is with normal saturations on room air here in the ED.   Physical Exam   Triage Vital Signs: ED Triage Vitals  Encounter Vitals Group     BP      Systolic BP Percentile      Diastolic BP Percentile      Pulse      Resp      Temp      Temp src      SpO2      Weight      Height      Head Circumference      Peak Flow      Pain Score      Pain Loc      Pain Education      Exclude from Growth Chart     Most recent vital signs: Vitals:   09/13/22 0620 09/13/22 0622  BP:  (!) 174/90  Pulse:  92  Resp:  20  Temp:  98.1 F (36.7 C)  SpO2: 94% 97%    General: Awake, no distress.  CV:  Good peripheral perfusion.  Resp:  Minimal tachypnea to the low 20s.  No distress, no wheezing Abd:  No distention.  Soft and benign MSK:  No deformity noted.  Lower extremity edema is present Neuro:  No focal deficits appreciated. Other:     ED Results /  Procedures / Treatments   Labs (all labs ordered are listed, but only abnormal results are displayed) Labs Reviewed  CBC WITH DIFFERENTIAL/PLATELET - Abnormal; Notable for the following components:      Result Value   WBC 14.2 (*)    Neutro Abs 8.6 (*)    Monocytes Absolute 1.4 (*)    All other components within normal limits  SARS CORONAVIRUS 2 BY RT PCR  BRAIN NATRIURETIC PEPTIDE  COMPREHENSIVE METABOLIC PANEL  URINALYSIS, ROUTINE W REFLEX MICROSCOPIC  TROPONIN I (HIGH SENSITIVITY)    EKG Sinus rhythm with a rate of 90 bpm.  Normal axis.  Right bundle.  No clear signs of acute ischemia.  RADIOLOGY 1 view CXR interpreted by me with cephalization and pulmonary vascular congestion without discrete lobar filtration or pleural effusion  Official radiology report(s): No results found.  PROCEDURES and INTERVENTIONS:  .1-3 Lead EKG Interpretation  Performed by: Delton Prairie, MD Authorized by: Delton Prairie, MD  Interpretation: normal     ECG rate:  90   ECG rate assessment: normal     Rhythm: sinus rhythm     Ectopy: none     Conduction: normal     Medications - No data to display   IMPRESSION / MDM / ASSESSMENT AND PLAN / ED COURSE  I reviewed the triage vital signs and the nursing notes.  Differential diagnosis includes, but is not limited to, COVID or other viral syndrome, CHF, cystitis, sepsis, ACS  {Patient presents with symptoms of an acute illness or injury that is potentially life-threatening.  Patient presents from home with dyspnea and orthopnea in the past couple hours, superimposed on a couple days of malaise and urinary frequency.  Reassuring vital signs without hypoxia.  Mild hypertension.  EKG without ischemic features.  CBC with leukocytosis and awaiting remainder of blood work around the time of signout.  CXR appears congested to me.  CHF new onset is a concern, awaiting UA to assess for cystitis.      FINAL CLINICAL IMPRESSION(S) / ED DIAGNOSES    Final diagnoses:  Shortness of breath     Rx / DC Orders   ED Discharge Orders     None        Note:  This document was prepared using Dragon voice recognition software and may include unintentional dictation errors.   Delton Prairie, MD 09/13/22 [phone]

## 2022-09-13 NOTE — ED Notes (Signed)
Went over d/c paperwork at this time with patient. Pt had no questions, comments or concerns after review and verbally understood them. Went over paperwork with patient, daughter and wife.

## 2022-09-13 NOTE — ED Notes (Signed)
carbidopa-levodopa (SINEMET IR) 25-100 MG tablet

## 2023-06-17 ENCOUNTER — Ambulatory Visit: Payer: Medicare PPO | Admitting: Dermatology

## 2023-06-17 ENCOUNTER — Encounter: Payer: Self-pay | Admitting: Dermatology

## 2023-06-17 DIAGNOSIS — C44622 Squamous cell carcinoma of skin of right upper limb, including shoulder: Secondary | ICD-10-CM | POA: Diagnosis not present

## 2023-06-17 DIAGNOSIS — Z1283 Encounter for screening for malignant neoplasm of skin: Secondary | ICD-10-CM | POA: Diagnosis not present

## 2023-06-17 DIAGNOSIS — L821 Other seborrheic keratosis: Secondary | ICD-10-CM

## 2023-06-17 DIAGNOSIS — D1801 Hemangioma of skin and subcutaneous tissue: Secondary | ICD-10-CM

## 2023-06-17 DIAGNOSIS — L570 Actinic keratosis: Secondary | ICD-10-CM

## 2023-06-17 DIAGNOSIS — W908XXA Exposure to other nonionizing radiation, initial encounter: Secondary | ICD-10-CM | POA: Diagnosis not present

## 2023-06-17 DIAGNOSIS — L578 Other skin changes due to chronic exposure to nonionizing radiation: Secondary | ICD-10-CM

## 2023-06-17 DIAGNOSIS — D492 Neoplasm of unspecified behavior of bone, soft tissue, and skin: Secondary | ICD-10-CM

## 2023-06-17 DIAGNOSIS — D229 Melanocytic nevi, unspecified: Secondary | ICD-10-CM

## 2023-06-17 DIAGNOSIS — D692 Other nonthrombocytopenic purpura: Secondary | ICD-10-CM

## 2023-06-17 DIAGNOSIS — L814 Other melanin hyperpigmentation: Secondary | ICD-10-CM

## 2023-06-17 DIAGNOSIS — L219 Seborrheic dermatitis, unspecified: Secondary | ICD-10-CM

## 2023-06-17 DIAGNOSIS — Z7189 Other specified counseling: Secondary | ICD-10-CM

## 2023-06-17 DIAGNOSIS — D225 Melanocytic nevi of trunk: Secondary | ICD-10-CM

## 2023-06-17 DIAGNOSIS — L820 Inflamed seborrheic keratosis: Secondary | ICD-10-CM | POA: Diagnosis not present

## 2023-06-17 DIAGNOSIS — C4492 Squamous cell carcinoma of skin, unspecified: Secondary | ICD-10-CM

## 2023-06-17 DIAGNOSIS — Z79899 Other long term (current) drug therapy: Secondary | ICD-10-CM

## 2023-06-17 HISTORY — DX: Squamous cell carcinoma of skin, unspecified: C44.92

## 2023-06-17 MED ORDER — KETOCONAZOLE 2 % EX SHAM: 1.0000 | MEDICATED_SHAMPOO | CUTANEOUS | 11 refills | Status: DC

## 2023-06-17 NOTE — Patient Instructions (Signed)

## 2023-06-17 NOTE — Progress Notes (Signed)
 Follow-Up Visit   Subjective  Chad Sandoval is a 78 y.o. male who presents for the following: Skin Cancer Screening and Upper Body Skin Exam, hx of Aks, check mole back irritating  The patient presents for Upper Body Skin Exam (UBSE) for skin cancer screening and mole check. The patient has spots, moles and lesions to be evaluated, some may be new or changing and the patient may have concern these could be cancer.  Patient accompanied by wife who contributes to history.  The following portions of the chart were reviewed this encounter and updated as appropriate: medications, allergies, medical history  Review of Systems:  No other skin or systemic complaints except as noted in HPI or Assessment and Plan.  Objective  Well appearing patient in no apparent distress; mood and affect are within normal limits.  All skin waist up examined. Relevant physical exam findings are noted in the Assessment and Plan.  scalp, face, ears x 11 (11) Pink scaly macules L forearm x 1 Stuck on waxy paps with erythema R dorsum medial middle forearm Hyperkeratotic flat pap 2.2cm  R mid back paraspinal 0.6cm flesh pap   Assessment & Plan   AK (ACTINIC KERATOSIS) (11) scalp, face, ears x 11 (11) Actinic keratoses are precancerous spots that appear secondary to cumulative UV radiation exposure/sun exposure over time. They are chronic with expected duration over 1 year. A portion of actinic keratoses will progress to squamous cell carcinoma of the skin. It is not possible to reliably predict which spots will progress to skin cancer and so treatment is recommended to prevent development of skin cancer.  Recommend daily broad spectrum sunscreen SPF 30+ to sun-exposed areas, reapply every 2 hours as needed.  Recommend staying in the shade or wearing long sleeves, sun glasses (UVA+UVB protection) and wide brim hats (4-inch brim around the entire circumference of the hat). Call for new or changing  lesions. Destruction of lesion - scalp, face, ears x 11 (11) Complexity: simple   Destruction method: cryotherapy   Informed consent: discussed and consent obtained   Timeout:  patient name, date of birth, surgical site, and procedure verified Lesion destroyed using liquid nitrogen: Yes   Region frozen until ice ball extended beyond lesion: Yes   Outcome: patient tolerated procedure well with no complications   Post-procedure details: wound care instructions given   INFLAMED SEBORRHEIC KERATOSIS L forearm x 1 Symptomatic, irritating, patient would like treated. Destruction of lesion - L forearm x 1 Complexity: simple   Destruction method: cryotherapy   Informed consent: discussed and consent obtained   Timeout:  patient name, date of birth, surgical site, and procedure verified Lesion destroyed using liquid nitrogen: Yes   Region frozen until ice ball extended beyond lesion: Yes   Outcome: patient tolerated procedure well with no complications   Post-procedure details: wound care instructions given   NEOPLASM OF SKIN (2) R dorsum medial middle forearm Epidermal / dermal shaving  Lesion diameter (cm):  2.2 Informed consent: discussed and consent obtained   Timeout: patient name, date of birth, surgical site, and procedure verified   Procedure prep:  Patient was prepped and draped in usual sterile fashion Prep type:  Isopropyl alcohol Anesthesia: the lesion was anesthetized in a standard fashion   Anesthetic:  1% lidocaine  w/ epinephrine 1-100,000 buffered w/ 8.4% NaHCO3 Instrument used: flexible razor blade   Hemostasis achieved with: pressure, aluminum chloride and electrodesiccation   Outcome: patient tolerated procedure well   Post-procedure details: sterile dressing applied  and wound care instructions given   Dressing type: bandage and bacitracin    Destruction of lesion Complexity: extensive   Destruction method: electrodesiccation and curettage   Informed consent:  discussed and consent obtained   Timeout:  patient name, date of birth, surgical site, and procedure verified Procedure prep:  Patient was prepped and draped in usual sterile fashion Prep type:  Isopropyl alcohol Anesthesia: the lesion was anesthetized in a standard fashion   Anesthetic:  1% lidocaine  w/ epinephrine 1-100,000 buffered w/ 8.4% NaHCO3 Curettage performed in three different directions: Yes   Electrodesiccation performed over the curetted area: Yes   Final wound size (cm):  2.2 Hemostasis achieved with:  pressure, aluminum chloride and electrodesiccation Outcome: patient tolerated procedure well with no complications   Post-procedure details: sterile dressing applied and wound care instructions given   Dressing type: bandage and bacitracin   Specimen 1 - Surgical pathology Differential Diagnosis: R/O SCC  Check Margins: No Hyperkeratotic flat pap 2.2cm EDC R mid back paraspinal Epidermal / dermal shaving  Lesion diameter (cm):  0.6 Informed consent: discussed and consent obtained   Timeout: patient name, date of birth, surgical site, and procedure verified   Procedure prep:  Patient was prepped and draped in usual sterile fashion Prep type:  Isopropyl alcohol Anesthesia: the lesion was anesthetized in a standard fashion   Anesthetic:  1% lidocaine  w/ epinephrine 1-100,000 buffered w/ 8.4% NaHCO3 Instrument used: flexible razor blade   Hemostasis achieved with: pressure, aluminum chloride and electrodesiccation   Outcome: patient tolerated procedure well   Post-procedure details: sterile dressing applied and wound care instructions given   Dressing type: bandage and bacitracin   Specimen 2 - Surgical pathology Differential Diagnosis: Traumatized Nevus r/o Dysplasia  Check Margins: yes 0.6cm flesh pap SKIN CANCER SCREENING   ACTINIC SKIN DAMAGE   LENTIGO   MELANOCYTIC NEVUS, UNSPECIFIED LOCATION   SEBORRHEIC DERMATITIS   COUNSELING AND COORDINATION OF  CARE   MEDICATION MANAGEMENT   PURPURA (HCC)   SCC (SQUAMOUS CELL CARCINOMA), ARM, RIGHT   Skin cancer screening performed today.  Actinic Damage - Chronic condition, secondary to cumulative UV/sun exposure - diffuse scaly erythematous macules with underlying dyspigmentation - Recommend daily broad spectrum sunscreen SPF 30+ to sun-exposed areas, reapply every 2 hours as needed.  - Staying in the shade or wearing long sleeves, sun glasses (UVA+UVB protection) and wide brim hats (4-inch brim around the entire circumference of the hat) are also recommended for sun protection.  - Call for new or changing lesions.  Lentigines, Seborrheic Keratoses, Hemangiomas - Benign normal skin lesions - Benign-appearing - Call for any changes  Melanocytic Nevi - Tan-brown and/or pink-flesh-colored symmetric macules and papules - Benign appearing on exam today - Observation - Call clinic for new or changing moles - Recommend daily use of broad spectrum spf 30+ sunscreen to sun-exposed areas.   SEBORRHEIC DERMATITIS scalp Exam: scalp with scale Chronic and persistent condition with duration or expected duration over one year. Condition is symptomatic / bothersome to patient. Not to goal. Seborrheic Dermatitis is a chronic persistent rash characterized by pinkness and scaling most commonly of the mid face but also can occur on the scalp (dandruff), ears; mid chest, mid back and groin.  It tends to be exacerbated by stress and cooler weather.  People who have neurologic disease may experience new onset or exacerbation of existing seborrheic dermatitis.  The condition is not curable but treatable and can be controlled. Treatment Plan: Cont Ketoconazole  2% shampoo 2-3x/wk,  let sit 5 minutes and rinse out  Long term medication management.  Patient is using long term (months to years) prescription medication  to control their dermatologic condition.  These medications require periodic monitoring to  evaluate for efficacy and side effects and may require periodic laboratory monitoring.  Purpura - Chronic; persistent and recurrent.  Treatable, but not curable. Arms - Violaceous macules and patches - Benign - Related to trauma, age, sun damage and/or use of blood thinners, chronic use of topical and/or oral steroids - Observe - Can use OTC arnica containing moisturizer such as Dermend Bruise Formula if desired - Call for worsening or other concerns     Return for 6-81m TBSE, hx of AKs.  I, Rollie Clipper, RMA, am acting as scribe for Celine Collard, MD .   Documentation: I have reviewed the above documentation for accuracy and completeness, and I agree with the above.  Celine Collard, MD

## 2023-06-23 ENCOUNTER — Encounter: Payer: Self-pay | Admitting: Dermatology

## 2023-06-23 LAB — SURGICAL PATHOLOGY

## 2023-06-24 ENCOUNTER — Telehealth: Payer: Self-pay

## 2023-06-24 NOTE — Telephone Encounter (Addendum)
 Called and discussed bx results with patient. He verbalized understanding and denied further questions. Will recheck areas at next follow up.  ----- Message from Celine Collard sent at 06/23/2023  5:51 PM EDT ----- FINAL DIAGNOSIS        1. Skin, R dorsum medial middle forearm :       WELL DIFFERENTIATED SQUAMOUS CELL CARCINOMA        2. Skin, R mid back paraspinal :       MELANOCYTIC NEVUS, INTRADERMAL TYPE, IRRITATED, DEEP MARGIN INVOLVED   1- Cancer = SCC Already treated Recheck next visit 2- benign mole Recheck next visit

## 2023-10-03 ENCOUNTER — Emergency Department

## 2023-10-03 ENCOUNTER — Observation Stay
Admission: EM | Admit: 2023-10-03 | Discharge: 2023-10-05 | Disposition: A | Discharge status: Home / Self Care | Attending: Family Medicine | Admitting: Family Medicine

## 2023-10-03 ENCOUNTER — Other Ambulatory Visit: Payer: Self-pay

## 2023-10-03 ENCOUNTER — Encounter: Payer: Self-pay | Admitting: Radiology

## 2023-10-03 DIAGNOSIS — Z794 Long term (current) use of insulin: Secondary | ICD-10-CM | POA: Diagnosis not present

## 2023-10-03 DIAGNOSIS — R4182 Altered mental status, unspecified: Principal | ICD-10-CM

## 2023-10-03 DIAGNOSIS — G20C Parkinsonism, unspecified: Secondary | ICD-10-CM | POA: Diagnosis not present

## 2023-10-03 DIAGNOSIS — E1142 Type 2 diabetes mellitus with diabetic polyneuropathy: Secondary | ICD-10-CM | POA: Diagnosis not present

## 2023-10-03 DIAGNOSIS — N390 Urinary tract infection, site not specified: Secondary | ICD-10-CM

## 2023-10-03 DIAGNOSIS — Z9081 Acquired absence of spleen: Secondary | ICD-10-CM | POA: Diagnosis not present

## 2023-10-03 DIAGNOSIS — R531 Weakness: Secondary | ICD-10-CM

## 2023-10-03 DIAGNOSIS — A4150 Gram-negative sepsis, unspecified: Secondary | ICD-10-CM | POA: Diagnosis not present

## 2023-10-03 DIAGNOSIS — G9340 Encephalopathy, unspecified: Principal | ICD-10-CM | POA: Diagnosis present

## 2023-10-03 DIAGNOSIS — C8590 Non-Hodgkin lymphoma, unspecified, unspecified site: Secondary | ICD-10-CM | POA: Diagnosis not present

## 2023-10-03 DIAGNOSIS — D72829 Elevated white blood cell count, unspecified: Secondary | ICD-10-CM | POA: Diagnosis not present

## 2023-10-03 DIAGNOSIS — E785 Hyperlipidemia, unspecified: Secondary | ICD-10-CM | POA: Insufficient documentation

## 2023-10-03 DIAGNOSIS — G20A1 Parkinson's disease without dyskinesia, without mention of fluctuations: Secondary | ICD-10-CM | POA: Diagnosis present

## 2023-10-03 DIAGNOSIS — C8307 Small cell B-cell lymphoma, spleen: Secondary | ICD-10-CM

## 2023-10-03 DIAGNOSIS — I10 Essential (primary) hypertension: Secondary | ICD-10-CM | POA: Insufficient documentation

## 2023-10-03 LAB — COMPREHENSIVE METABOLIC PANEL WITH GFR
ALT: <5 U/L (ref 0–44)
AST: 21 U/L (ref 15–41)
Albumin: 4.2 g/dL (ref 3.5–5.0)
Alkaline Phosphatase: 164 U/L — ABNORMAL HIGH (ref 38–126)
Anion gap: 6 (ref 5–15)
BUN: 21 mg/dL (ref 8–23)
CO2: 25 mmol/L (ref 22–32)
Calcium: 9.7 mg/dL (ref 8.9–10.3)
Chloride: 104 mmol/L (ref 98–111)
Creatinine, Ser: 0.98 mg/dL (ref 0.61–1.24)
GFR, Estimated: >60 mL/min (ref >60)
Glucose, Bld: 122 mg/dL — ABNORMAL HIGH (ref 70–99)
Potassium: 4.3 mmol/L (ref 3.5–5.1)
Sodium: 135 mmol/L (ref 135–145)
Total Bilirubin: 0.9 mg/dL (ref 0.0–1.2)
Total Protein: 7.1 g/dL (ref 6.5–8.1)

## 2023-10-03 LAB — CBC
HCT: 47.6 % (ref 39.0–52.0)
Hemoglobin: 16 g/dL (ref 13.0–17.0)
MCH: 32 pg (ref 26.0–34.0)
MCHC: 33.6 g/dL (ref 30.0–36.0)
MCV: 95.2 fL (ref 80.0–100.0)
Platelets: 321 K/uL (ref 150–400)
RBC: 5 MIL/uL (ref 4.22–5.81)
RDW: 13.1 % (ref 11.5–15.5)
WBC: 16.4 K/uL — ABNORMAL HIGH (ref 4.0–10.5)
nRBC: 0 % (ref 0.0–0.2)

## 2023-10-03 LAB — URINALYSIS, ROUTINE W REFLEX MICROSCOPIC
Bilirubin Urine: NEGATIVE
Glucose, UA: NEGATIVE mg/dL
Ketones, ur: NEGATIVE mg/dL
Leukocytes,Ua: NEGATIVE
Nitrite: NEGATIVE
Protein, ur: NEGATIVE mg/dL
RBC / HPF: >50 RBC/hpf (ref 0–5)
Specific Gravity, Urine: 1.008 (ref 1.005–1.030)
Squamous Epithelial / HPF: 0 /HPF (ref 0–5)
pH: 6 (ref 5.0–8.0)

## 2023-10-03 LAB — PROCALCITONIN: Procalcitonin: <0.1 ng/mL

## 2023-10-03 LAB — LACTIC ACID, PLASMA: Lactic Acid, Venous: 0.9 mmol/L (ref 0.5–1.9)

## 2023-10-03 MED ORDER — ACETAMINOPHEN 650 MG RE SUPP: 650.0000 mg | Freq: Four times a day (QID) | RECTAL | Status: DC | PRN

## 2023-10-03 MED ORDER — KETOCONAZOLE 2 % EX SHAM: 1.0000 | MEDICATED_SHAMPOO | CUTANEOUS | Status: DC

## 2023-10-03 MED ORDER — QUETIAPINE FUMARATE 25 MG PO TABS
50.0000 mg | ORAL_TABLET | Freq: Every day | ORAL | Status: DC
  Administered 2023-10-03 – 2023-10-04 (×2): 50 mg via ORAL
  Filled 2023-10-03 (×2): qty 2

## 2023-10-03 MED ORDER — ROPINIROLE HCL 0.25 MG PO TABS: 0.2500 mg | ORAL_TABLET | Freq: Every day | ORAL | Status: DC

## 2023-10-03 MED ORDER — LACTATED RINGERS IV SOLN
150.0000 mL/h | INTRAVENOUS | Status: DC
  Administered 2023-10-03 – 2023-10-04 (×4): 150 mL/h via INTRAVENOUS

## 2023-10-03 MED ORDER — AMANTADINE HCL 100 MG PO CAPS
100.0000 mg | ORAL_CAPSULE | Freq: Three times a day (TID) | ORAL | Status: DC
  Administered 2023-10-04 – 2023-10-05 (×5): 100 mg via ORAL
  Filled 2023-10-03 (×6): qty 1

## 2023-10-03 MED ORDER — PRAVASTATIN SODIUM 20 MG PO TABS
20.0000 mg | ORAL_TABLET | Freq: Every day | ORAL | Status: DC
  Administered 2023-10-03 – 2023-10-04 (×2): 20 mg via ORAL
  Filled 2023-10-03 (×2): qty 1

## 2023-10-03 MED ORDER — TRAZODONE HCL 50 MG PO TABS: 25.0000 mg | ORAL_TABLET | Freq: Every evening | ORAL | Status: DC | PRN

## 2023-10-03 MED ORDER — PRAVASTATIN SODIUM 20 MG PO TABS: 20.0000 mg | ORAL_TABLET | Freq: Every day | ORAL | Status: DC

## 2023-10-03 MED ORDER — MAGNESIUM HYDROXIDE 400 MG/5ML PO SUSP: 30.0000 mL | Freq: Every day | ORAL | Status: DC | PRN

## 2023-10-03 MED ORDER — ACETAMINOPHEN 325 MG PO TABS: 650.0000 mg | ORAL_TABLET | Freq: Four times a day (QID) | ORAL | Status: DC | PRN

## 2023-10-03 MED ORDER — CARBIDOPA-LEVODOPA 25-100 MG PO TABS
2.0000 | ORAL_TABLET | Freq: Four times a day (QID) | ORAL | Status: DC
  Administered 2023-10-04 – 2023-10-05 (×8): 2 via ORAL
  Filled 2023-10-03 (×6): qty 2

## 2023-10-03 MED ORDER — GLYCOPYRROLATE 1 MG PO TABS
0.5000 mg | ORAL_TABLET | Freq: Two times a day (BID) | ORAL | Status: DC
  Administered 2023-10-04 (×2): 0.5 mg via ORAL
  Filled 2023-10-03 (×4): qty 1

## 2023-10-03 MED ORDER — SODIUM CHLORIDE 0.9 % IV SOLN
2.0000 g | INTRAVENOUS | Status: DC
  Administered 2023-10-03 – 2023-10-04 (×2): 2 g via INTRAVENOUS
  Filled 2023-10-03 (×3): qty 20

## 2023-10-03 MED ORDER — ONDANSETRON HCL 4 MG/2ML IJ SOLN: 4.0000 mg | Freq: Four times a day (QID) | INTRAMUSCULAR | Status: DC | PRN

## 2023-10-03 MED ORDER — PENTOSAN POLYSULFATE SODIUM 100 MG PO CAPS
100.0000 mg | ORAL_CAPSULE | Freq: Three times a day (TID) | ORAL | Status: DC
  Administered 2023-10-04 – 2023-10-05 (×7): 100 mg via ORAL
  Filled 2023-10-03 (×7): qty 1

## 2023-10-03 MED ORDER — VITAMIN D3 50 MCG (2000 UT) PO TABS: 2000.0000 [IU] | ORAL_TABLET | Freq: Every day | ORAL | Status: DC

## 2023-10-03 MED ORDER — VENLAFAXINE HCL 37.5 MG PO TABS
37.5000 mg | ORAL_TABLET | Freq: Two times a day (BID) | ORAL | Status: DC
  Administered 2023-10-04 – 2023-10-05 (×4): 37.5 mg via ORAL
  Filled 2023-10-03 (×4): qty 1

## 2023-10-03 MED ORDER — CARBIDOPA-LEVODOPA ER 50-200 MG PO TBCR
1.0000 | EXTENDED_RELEASE_TABLET | Freq: Every day | ORAL | Status: DC
  Administered 2023-10-04: 1 via ORAL
  Filled 2023-10-03 (×3): qty 1

## 2023-10-03 MED ORDER — ENOXAPARIN SODIUM 40 MG/0.4ML IJ SOSY
40.0000 mg | PREFILLED_SYRINGE | INTRAMUSCULAR | Status: DC
  Administered 2023-10-04 – 2023-10-05 (×3): 40 mg via SUBCUTANEOUS
  Filled 2023-10-03 (×2): qty 0.4

## 2023-10-03 MED ORDER — ADULT MULTIVITAMIN W/MINERALS CH
1.0000 | ORAL_TABLET | Freq: Every day | ORAL | Status: DC
  Administered 2023-10-04 – 2023-10-05 (×3): 1 via ORAL
  Filled 2023-10-03 (×2): qty 1

## 2023-10-03 MED ORDER — ONDANSETRON HCL 4 MG PO TABS: 4.0000 mg | ORAL_TABLET | Freq: Four times a day (QID) | ORAL | Status: DC | PRN

## 2023-10-03 MED ORDER — LACTATED RINGERS IV BOLUS
1000.0000 mL | Freq: Once | INTRAVENOUS | Status: AC
  Administered 2023-10-03: 1000 mL via INTRAVENOUS

## 2023-10-03 NOTE — ED Triage Notes (Signed)
 Pt accompanied by spouse who reports pt has been diagnosed with a UTI 2 weeks ago, pt started on new antibiotic and had reaction, per spouse pt now on second antibiotic, states pt is disoriented, weak, fell a couple of days ago on top on his daughter. Per spouse pt able to talk to you, when RN asked pt what's going on pt started to [facility] words, unable to understand. Pt is calm in triage no respiratory distress noted

## 2023-10-03 NOTE — H&P (Addendum)
 [place]   PATIENT NAME: Chad Sandoval    MR#:  [id number]  DATE OF BIRTH:  1945/10/09  DATE OF ADMISSION:  10/03/2023  PRIMARY CARE PHYSICIAN: Alla Amis, MD   Patient is coming from: Home  REQUESTING/REFERRING PHYSICIAN: Claudene Rover, MD   CHIEF COMPLAINT:   Chief Complaint  Patient presents with   Altered Mental Status    HISTORY OF PRESENT ILLNESS:  Chad Sandoval is a 78 y.o. 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  The patient had a fall on Thursday without head injury and slid out of bed today.  He could not get up from the ground per his wife.  He has been treated for UTI a couple weeks ago and had a reaction to Bactrim then was switched to doxycycline which she has been taking over the last couple of days.  No fever or chills.  No nausea or vomiting or abdominal pain.  No cough or wheezing or dyspnea.  No chest pain or palpitations.  No new paresthesias or focal muscle weakness.  ED Course: When he came to the ER, BP was 132/97 with otherwise normal vital signs.  Labs revealed alk phos 164 with otherwise unremarkable CMP.  Lactic acid was 0.9 procalcitonin less than 0.1.  CBC showed leukocytosis of 16.4.  UA showed more than 50 RBCs and rare bacteria.  Urine culture was sent.  2 blood cultures were sent. EKG as reviewed by me : None Imaging: Portable chest x-ray showed no acute cardiopulmonary disease.  Brain MRI without contrast revealed no acute intracranial abnormalities and mild age-related cerebral atrophy with chronic small vessel ischemic disease.  It showed loss of normal flow within the mid distal right V4 segment that could reflect slow flow and/or occlusion.  The patient was given 1 L bolus of IV  lactated ringer .  He will be admitted to a medical telemetry observation bed for further evaluation and management. 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   right dorsum medial middle forearm treated with ED&C   Thrombocytopenia (HCC)     PAST SURGICAL HISTORY:   Past Surgical History:  Procedure Laterality Date   RETROPUBIC PROSTATECTOMY  03/02/2001   [facility]   SPLENECTOMY  04/2012   [facility]    SOCIAL HISTORY:   Social History   Tobacco Use   Smoking status: Never   Smokeless tobacco: Never  Substance Use Topics   Alcohol use: No    FAMILY HISTORY:   Family History  Problem Relation Age of Onset   Cancer Father        prostate   Alzheimer's disease Mother     DRUG ALLERGIES:   Allergies  Allergen Reactions   Niacin Other (See Comments)    PER PT-MADE HIM FEEL REALLY HOT ALL OVER BODY   Oxycontin [Oxycodone Hcl] Other (See Comments)    caused him  to be really hot    REVIEW OF SYSTEMS:   ROS As per history of present illness. All pertinent systems were reviewed above. Constitutional, HEENT, cardiovascular, respiratory, GI, GU, musculoskeletal, neuro, psychiatric, endocrine, integumentary and hematologic systems were reviewed and are otherwise negative/unremarkable except for positive findings mentioned above in the HPI.   MEDICATIONS AT HOME:   Prior to Admission medications   Medication Sig Start Date End Date Taking? Authorizing Provider  Amantadine  HCl 100 MG tablet Take 100 mg by mouth 3 (three) times daily. Admin times: 1000, 1400 & 1800 05/20/20  Yes [provider]   carbidopa -levodopa  (SINEMET  CR) 50-200 MG tablet Take 1 tablet by mouth at bedtime. 02/27/20  Yes [provider]  carbidopa -levodopa  (SINEMET  IR) 25-100 MG tablet Take 2 tablets by mouth 4 (four) times daily. Admin times: 0700, 1000, 1400, & 1800   Yes [provider]  glycopyrrolate  (ROBINUL ) 1 MG tablet Take 1 mg by mouth daily.   Yes [provider]  lovastatin (MEVACOR) 20 MG tablet TAKE 1 TABLET BY MOUTH EVERY DAY AT NIGHT 08/29/18  Yes [provider]  metFORMIN (GLUCOPHAGE-XR) 500 MG 24 hr tablet Take 1,000 mg by mouth every morning.   Yes [provider]  pentosan polysulfate (ELMIRON ) 100 MG capsule Take 100 mg by mouth 3 (three) times daily. Admin times: 1000, 1600 & 2200 11/02/17  Yes [provider]  QUEtiapine  (SEROQUEL ) 50 MG tablet Take 50 mg by mouth at bedtime.   Yes [provider]  venlafaxine  (EFFEXOR ) 37.5 MG tablet Take 37.5 mg by mouth 2 (two) times daily. 06/24/18  Yes [provider]  Cholecalciferol (VITAMIN D3) 2000 units TABS Take 2,000 Units by mouth daily.  Patient not taking: Reported on 10/03/2023    [provider]  furosemide  (LASIX ) 20 MG tablet Take 1 tablet twice a day for 3 days then daily. Patient not taking: Reported on 06/17/2023 09/13/22   Angelena Smalls, MD  gabapentin (NEURONTIN) 300 MG capsule Take 300 mg by mouth 2 (two) times daily.  Patient not taking: Reported on 06/11/2022 01/21/17   [provider]  ketoconazole  (NIZORAL ) 2 % shampoo Apply 1 Application topically as directed. Wash scalp 2-3 times a week, let sit 5 minutes and rinse out Patient not taking: Reported on 10/03/2023 06/17/23   Hester Alm BROCKS, MD  lisinopril (PRINIVIL,ZESTRIL) 10 MG tablet Take 5 mg by mouth daily. Patient not taking: Reported on 06/11/2022 01/22/15   [provider]  Multiple Vitamin (MULTI-VITAMINS) TABS Take 1 tablet by mouth daily.  Patient not taking: Reported on 10/03/2023     [provider]  potassium chloride  SA (KLOR-CON  M) 10 MEQ tablet Take 1 tablet (10 mEq total) by mouth as needed. Take 1 tablet with each dose of furosemide  (so twice daily x 3 days, then daily with your dose). Patient not taking: Reported on 10/03/2023 09/13/22   Angelena Smalls, MD  sildenafil (REVATIO) 20 MG tablet Take 1-5 tablets by mouth as needed (erectile dysfucntion).  Patient not taking: Reported on 10/03/2023 09/14/17   [provider]  sildenafil (VIAGRA) 100 MG tablet Take 100 mg by mouth daily as needed for erectile dysfunction. Patient not taking: Reported on 10/03/2023    [provider]  traMADol (ULTRAM) 50 MG tablet Take by mouth. Patient not taking: Reported on 06/17/2023 02/19/22   [provider]      VITAL SIGNS:  Blood pressure (!) 144/83, pulse 88, temperature 97.8 F (36.6 C), temperature source  Oral, resp. rate 20, height 5' 9 (1.753 m), weight 88.5 kg, SpO2 97%.  PHYSICAL EXAMINATION:  Physical Exam  GENERAL:  78 y.o.-year-old, White male patient lying in the bed with no acute distress.  EYES: Pupils equal, round, reactive to light and accommodation. No scleral icterus. Extraocular muscles intact.  HEENT: Head atraumatic, normocephalic. Oropharynx and nasopharynx clear.  NECK:  Supple, no jugular venous distention. No thyroid enlargement, no tenderness.  LUNGS: Normal breath sounds bilaterally, no wheezing, rales,rhonchi or crepitation. No use of accessory muscles of respiration.  CARDIOVASCULAR: Regular rate and rhythm, S1, S2 normal. No murmurs, rubs, or gallops.  ABDOMEN: Soft, nondistended, nontender. Bowel sounds present. No organomegaly or mass.  EXTREMITIES: No pedal edema, cyanosis, or clubbing.  NEUROLOGIC: Cranial nerves II through XII are intact. Muscle strength 5/5 in all extremities. Sensation intact. Gait not checked.  PSYCHIATRIC: The patient is alert and oriented x 3.  Normal affect and good eye contact. SKIN: No  obvious rash, lesion, or ulcer.   LABORATORY PANEL:   CBC Recent Labs  Lab 10/03/23 1247  WBC 16.4*  HGB 16.0  HCT 47.6  PLT 321   ------------------------------------------------------------------------------------------------------------------  Chemistries  Recent Labs  Lab 10/03/23 1247  NA 135  K 4.3  CL 104  CO2 25  GLUCOSE 122*  BUN 21  CREATININE 0.98  CALCIUM 9.7  AST 21  ALT <5  ALKPHOS 164*  BILITOT 0.9   ------------------------------------------------------------------------------------------------------------------  Cardiac Enzymes No results for input(s): TROPONINI in the last 168 hours. ------------------------------------------------------------------------------------------------------------------  RADIOLOGY:  MR BRAIN WO CONTRAST Result Date: 10/03/2023 CLINICAL DATA:  Initial evaluation for acute dysarthria. EXAM: MRI HEAD WITHOUT CONTRAST TECHNIQUE: Multiplanar, multiecho pulse sequences of the brain and surrounding structures were obtained without intravenous contrast. COMPARISON:  CT from earlier the same day. FINDINGS: Brain: Mild age-related cerebral atrophy. Patchy T2/FLAIR hyperintensity involving the periventricular and deep white matter both cerebral hemispheres, most likely related chronic microvascular ischemic disease, mild for age. No abnormal foci of restricted diffusion to suggest acute or subacute ischemia. Gray-white matter differentiation maintained. No areas of chronic cortical infarction. No acute or chronic intracranial blood products. No mass lesion, midline shift or mass effect. No hydrocephalus or extra-axial fluid collection. Pituitary gland within normal limits. Vascular: Loss of normal flow void within the mid-distal right V4 segment, which could reflect slow flow and/or occlusion. Left vertebral artery mildly tortuous and partially invaginate upon the medulla. Major intracranial vascular flow voids are otherwise maintained. Skull  and upper cervical spine: Craniocervical junction within normal limits. Bone marrow signal intensity overall within normal limits. No scalp soft tissue abnormality. Sinuses/Orbits: Prior bilateral ocular lens replacement. Paranasal sinuses are largely clear. No significant mastoid effusion. Other: None. IMPRESSION: 1. No acute intracranial abnormality. 2. Mild age-related cerebral atrophy with chronic small vessel ischemic disease. 3. Loss of normal flow void within the mid-distal right V4 segment, which could reflect slow flow and/or occlusion. Electronically Signed   By: Morene Hoard M.D.   On: 10/03/2023 21:54   CT HEAD WO CONTRAST ( ) Result Date: 10/03/2023 CLINICAL DATA:  nonfocal altered EXAM: CT HEAD WITHOUT CONTRAST TECHNIQUE: Contiguous axial images were obtained from the base of the skull through the vertex without intravenous contrast. RADIATION DOSE REDUCTION: This exam was performed according to the departmental dose-optimization program which includes automated exposure control, adjustment of the mA and/or kV according to patient size and/or use of iterative reconstruction technique. COMPARISON:  June 23, 2019 FINDINGS: Brain: Proportional prominence of the ventricles and sulci,  consistent with diffuse cerebral parenchymal volume loss. The ventricles otherwise maintained midline position without midline shift. Gray-white differentiation is preserved.Scattered periventricular white matter hypoattenuation, most consistent with changes of mild chronic ischemic microvascular disease.No evidence of acute territorial infarction, extra-axial fluid collection, hemorrhage, or mass lesion. The basilar cisterns are patent without downward herniation. The cerebellar hemispheres and vermis are well formed without mass lesion or focal attenuation abnormality. Vascular: No hyperdense vessel. Calcified atherosclerotic plaque within the cavernous/supraclinoid ICA and intradural vertebral arteries. Skull:  Normal. Negative for fracture or focal lesion. Sinuses/Orbits: The paranasal sinuses and mastoids are clear.The globes appear intact. No retrobulbar hematoma. Other: None. IMPRESSION: No acute intracranial abnormality, specifically, no acute hemorrhage, territorial infarction, or intracranial mass. Electronically Signed   By: Rogelia Myers M.D.   On: 10/03/2023 16:47   DG Chest Portable 1 View Result Date: 10/03/2023 CLINICAL DATA:  Sepsis. EXAM: PORTABLE CHEST 1 VIEW COMPARISON:  09/13/2022 FINDINGS: The heart size and mediastinal contours are within normal limits. Both lungs are clear. The visualized skeletal structures are unremarkable. IMPRESSION: No active disease. Electronically Signed   By: Elspeth Bathe M.D.   On: 10/03/2023 15:48   DG Hip Unilat W or Wo Pelvis 2-3 Views Right Result Date: 10/03/2023 CLINICAL DATA:  Right hip pain EXAM: DG HIP (WITH OR WITHOUT PELVIS) 2-3V RIGHT COMPARISON:  None Available. FINDINGS: Osteopenia.No evidence of pelvic fracture or diastasis.No acute hip fracture or dislocation.There is no evidence of arthropathy or other focal bone abnormality.Soft tissues are unremarkable. IMPRESSION: No acute fracture, pelvic bone diastasis, or dislocation. Electronically Signed   By: Rogelia Myers M.D.   On: 10/03/2023 13:58      IMPRESSION AND PLAN:  Assessment and Plan: * Acute encephalopathy - This could be related to residual UTI with subsequent sepsis given tachycardia, tachypnea and leukocytosis. - The patient will be admitted to an observation medical telemetry bed. - Will place him on IV Rocephin  and follow urine and blood cultures. - He will be hydrated with IV lactated ringer . - Will follow neurochecks every 4 hours for 24 hours. - PT, OT and ST consult to be obtained given his generalized weakness.   Essential hypertension - Will continue antihypertensive therapy.  Type 2 diabetes mellitus with peripheral neuropathy (HCC) - The patient will be placed on  supplemental coverage with NovoLog. - Will hold off metformin. - Will continue Neurontin.  Dyslipidemia - Will continue statin therapy.  Parkinson's disease (HCC) - Will continue his Sinemet  CR and IR.   DVT prophylaxis: Lovenox .  Advanced Care Planning:  Code Status: full code.  Family Communication:  The plan of care was discussed in details with the patient (and family). I answered all questions. The patient agreed to proceed with the above mentioned plan. Further management will depend upon hospital course. Disposition Plan: Back to previous home environment Consults called: none.  All the records are reviewed and case discussed with ED provider.  Status is: Observation  I certify that at the time of admission, it is my clinical judgment that the patient will require  hospital care extending less than 2 midnights.                            Dispo: The patient is from: Home              Anticipated d/c is to: Home              Patient currently is not  medically stable to d/c.              Difficult to place patient: No  [place]ison DELENA Peaches M.D on 10/04/2023 at 2:43 AM  [facility]   From 7 PM-7 AM, contact night-coverage [email or web address]  CC: Primary care physician; Alla Amis, MD

## 2023-10-03 NOTE — ED Notes (Signed)
 Pt to MRI

## 2023-10-03 NOTE — H&P (Incomplete)
 [place]   PATIENT NAME: Chad Sandoval    MR#:  [id number]  DATE OF BIRTH:  12-22-1945  DATE OF ADMISSION:  10/03/2023  PRIMARY CARE PHYSICIAN: Alla Amis, MD   Patient is coming from: ***  REQUESTING/REFERRING PHYSICIAN: ***  CHIEF COMPLAINT:   Chief Complaint  Patient presents with  . Altered Mental Status    HISTORY OF PRESENT ILLNESS:  Chad Sandoval is a 78 y.o. male with medical history significant for ***  ED Course: *** EKG as reviewed by me : *** Imaging: *** PAST MEDICAL HISTORY:   Past Medical History:  Diagnosis Date  . Actinic keratosis   . History of radiation therapy 11/03/13- 12/19/13   prostate bed 6600 cGy 33 sessions  . 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 Parkinson disease (HCC) 2016  . Prostate cancer [facility]) 2003   s/p radical prostatectomy  . 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 Thrombocytopenia (HCC)     PAST SURGICAL HISTORY:   Past Surgical History:  Procedure Laterality Date  . RETROPUBIC PROSTATECTOMY  03/02/2001   [facility]  . 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 Smoking status: Never  . Smokeless tobacco: Never  Substance Use Topics  . Alcohol use: No    FAMILY HISTORY:   Family History  Problem Relation Age of Onset  . Cancer Father        prostate  . Alzheimer's disease Mother     DRUG ALLERGIES:   Allergies  Allergen Reactions  . Niacin Other (See Comments)    PER PT-MADE HIM FEEL REALLY HOT ALL OVER BODY  . Oxycontin [Oxycodone Hcl] Other (See Comments)    caused him to be really hot    REVIEW OF SYSTEMS:   ROS As per history of present illness. All pertinent systems were reviewed above. Constitutional, HEENT, cardiovascular, respiratory, GI, GU, musculoskeletal, neuro, psychiatric, endocrine, integumentary  and hematologic systems were reviewed and are otherwise negative/unremarkable except for positive findings mentioned above in the HPI.   MEDICATIONS AT HOME:   Prior to Admission medications   Medication Sig Start Date End Date Taking? Authorizing Provider  Amantadine  HCl 100 MG tablet Take 100 mg by mouth 3 (three) times daily. Admin times: 1000, 1400 & 1800 05/20/20  Yes [provider]  carbidopa -levodopa  (SINEMET  CR) 50-200 MG tablet Take 1 tablet by mouth at bedtime. 02/27/20  Yes [provider]  carbidopa -levodopa  (SINEMET  IR) 25-100 MG tablet Take 2 tablets by mouth 4 (four) times daily. Admin times: 0700, 1000, 1400, & 1800   Yes [provider]  glycopyrrolate  (ROBINUL ) 1 MG tablet Take 1 mg by mouth daily.   Yes [provider]  lovastatin (MEVACOR) 20 MG tablet TAKE 1 TABLET BY MOUTH EVERY DAY AT NIGHT 08/29/18  Yes [provider]  metFORMIN (GLUCOPHAGE-XR) 500 MG 24 hr tablet Take 1,000 mg by mouth every morning.   Yes [provider]  pentosan polysulfate (ELMIRON ) 100 MG capsule Take 100 mg by mouth 3 (three) times daily. Admin times: 1000, 1600 & 2200 11/02/17  Yes [provider]  QUEtiapine  (SEROQUEL ) 50 MG tablet Take 50 mg by mouth at bedtime.   Yes [provider]  venlafaxine  (EFFEXOR ) 37.5 MG tablet Take 37.5 mg  by mouth 2 (two) times daily. 06/24/18  Yes [provider]  Cholecalciferol (VITAMIN D3) 2000 units TABS Take 2,000 Units by mouth daily.  Patient not taking: Reported on 10/03/2023    [provider]  furosemide  (LASIX ) 20 MG tablet Take 1 tablet twice a day for 3 days then daily. Patient not taking: Reported on 06/17/2023 09/13/22   Angelena Smalls, MD  gabapentin (NEURONTIN) 300 MG capsule Take 300 mg by mouth 2 (two) times daily.  Patient not taking: Reported on 06/11/2022 01/21/17   [provider]  ketoconazole  (NIZORAL ) 2 % shampoo Apply 1 Application topically as  directed. Wash scalp 2-3 times a week, let sit 5 minutes and rinse out Patient not taking: Reported on 10/03/2023 06/17/23   Hester Alm BROCKS, MD  lisinopril (PRINIVIL,ZESTRIL) 10 MG tablet Take 5 mg by mouth daily. Patient not taking: Reported on 06/11/2022 01/22/15   [provider]  Multiple Vitamin (MULTI-VITAMINS) TABS Take 1 tablet by mouth daily.  Patient not taking: Reported on 10/03/2023    [provider]  potassium chloride  SA (KLOR-CON  M) 10 MEQ tablet Take 1 tablet (10 mEq total) by mouth as needed. Take 1 tablet with each dose of furosemide  (so twice daily x 3 days, then daily with your dose). Patient not taking: Reported on 10/03/2023 09/13/22   Angelena Smalls, MD  sildenafil (REVATIO) 20 MG tablet Take 1-5 tablets by mouth as needed (erectile dysfucntion).  Patient not taking: Reported on 10/03/2023 09/14/17   [provider]  sildenafil (VIAGRA) 100 MG tablet Take 100 mg by mouth daily as needed for erectile dysfunction. Patient not taking: Reported on 10/03/2023    [provider]  traMADol (ULTRAM) 50 MG tablet Take by mouth. Patient not taking: Reported on 06/17/2023 02/19/22   [provider]      VITAL SIGNS:  Blood pressure 137/86, pulse 85, temperature (!) 97.5 F (36.4 C), temperature source Oral, resp. rate 19, height 5' 9 (1.753 m), weight 88.5 kg, SpO2 97%.  PHYSICAL EXAMINATION:  Physical Exam  GENERAL:  78 y.o.-year-old patient lying in the bed with no acute distress.  EYES: Pupils equal, round, reactive to light and accommodation. No scleral icterus. Extraocular muscles intact.  HEENT: Head atraumatic, normocephalic. Oropharynx and nasopharynx clear.  NECK:  Supple, no jugular venous distention. No thyroid enlargement, no tenderness.  LUNGS: Normal breath sounds bilaterally, no wheezing, rales,rhonchi or crepitation. No use of accessory muscles of respiration.  CARDIOVASCULAR: Regular rate and rhythm, S1, S2 normal. No  murmurs, rubs, or gallops.  ABDOMEN: Soft, nondistended, nontender. Bowel sounds present. No organomegaly or mass.  EXTREMITIES: No pedal edema, cyanosis, or clubbing.  NEUROLOGIC: Cranial nerves II through XII are intact. Muscle strength 5/5 in all extremities. Sensation intact. Gait not checked.  PSYCHIATRIC: The patient is alert and oriented x 3.  Normal affect and good eye contact. SKIN: No obvious rash, lesion, or ulcer.   LABORATORY PANEL:   CBC Recent Labs  Lab 10/03/23 1247  WBC 16.4*  HGB 16.0  HCT 47.6  PLT 321   ------------------------------------------------------------------------------------------------------------------  Chemistries  Recent Labs  Lab 10/03/23 1247  NA 135  K 4.3  CL 104  CO2 25  GLUCOSE 122*  BUN 21  CREATININE 0.98  CALCIUM 9.7  AST 21  ALT <5  ALKPHOS 164*  BILITOT 0.9   ------------------------------------------------------------------------------------------------------------------  Cardiac Enzymes No results for input(s): TROPONINI in the last 168 hours. ------------------------------------------------------------------------------------------------------------------  RADIOLOGY:  MR BRAIN WO CONTRAST Result Date: 10/03/2023  CLINICAL DATA:  Initial evaluation for acute dysarthria. EXAM: MRI HEAD WITHOUT CONTRAST TECHNIQUE: Multiplanar, multiecho pulse sequences of the brain and surrounding structures were obtained without intravenous contrast. COMPARISON:  CT from earlier the same day. FINDINGS: Brain: Mild age-related cerebral atrophy. Patchy T2/FLAIR hyperintensity involving the periventricular and deep white matter both cerebral hemispheres, most likely related chronic microvascular ischemic disease, mild for age. No abnormal foci of restricted diffusion to suggest acute or subacute ischemia. Gray-white matter differentiation maintained. No areas of chronic cortical infarction. No acute or chronic intracranial blood products. No  mass lesion, midline shift or mass effect. No hydrocephalus or extra-axial fluid collection. Pituitary gland within normal limits. Vascular: Loss of normal flow void within the mid-distal right V4 segment, which could reflect slow flow and/or occlusion. Left vertebral artery mildly tortuous and partially invaginate upon the medulla. Major intracranial vascular flow voids are otherwise maintained. Skull and upper cervical spine: Craniocervical junction within normal limits. Bone marrow signal intensity overall within normal limits. No scalp soft tissue abnormality. Sinuses/Orbits: Prior bilateral ocular lens replacement. Paranasal sinuses are largely clear. No significant mastoid effusion. Other: None. IMPRESSION: 1. No acute intracranial abnormality. 2. Mild age-related cerebral atrophy with chronic small vessel ischemic disease. 3. Loss of normal flow void within the mid-distal right V4 segment, which could reflect slow flow and/or occlusion. Electronically Signed   By: Morene Hoard M.D.   On: 10/03/2023 21:54   CT HEAD WO CONTRAST ( ) Result Date: 10/03/2023 CLINICAL DATA:  nonfocal altered EXAM: CT HEAD WITHOUT CONTRAST TECHNIQUE: Contiguous axial images were obtained from the base of the skull through the vertex without intravenous contrast. RADIATION DOSE REDUCTION: This exam was performed according to the departmental dose-optimization program which includes automated exposure control, adjustment of the mA and/or kV according to patient size and/or use of iterative reconstruction technique. COMPARISON:  June 23, 2019 FINDINGS: Brain: Proportional prominence of the ventricles and sulci, consistent with diffuse cerebral parenchymal volume loss. The ventricles otherwise maintained midline position without midline shift. Gray-white differentiation is preserved.Scattered periventricular white matter hypoattenuation, most consistent with changes of mild chronic ischemic microvascular disease.No evidence  of acute territorial infarction, extra-axial fluid collection, hemorrhage, or mass lesion. The basilar cisterns are patent without downward herniation. The cerebellar hemispheres and vermis are well formed without mass lesion or focal attenuation abnormality. Vascular: No hyperdense vessel. Calcified atherosclerotic plaque within the cavernous/supraclinoid ICA and intradural vertebral arteries. Skull: Normal. Negative for fracture or focal lesion. Sinuses/Orbits: The paranasal sinuses and mastoids are clear.The globes appear intact. No retrobulbar hematoma. Other: None. IMPRESSION: No acute intracranial abnormality, specifically, no acute hemorrhage, territorial infarction, or intracranial mass. Electronically Signed   By: Rogelia Myers M.D.   On: 10/03/2023 16:47   DG Chest Portable 1 View Result Date: 10/03/2023 CLINICAL DATA:  Sepsis. EXAM: PORTABLE CHEST 1 VIEW COMPARISON:  09/13/2022 FINDINGS: The heart size and mediastinal contours are within normal limits. Both lungs are clear. The visualized skeletal structures are unremarkable. IMPRESSION: No active disease. Electronically Signed   By: Elspeth Bathe M.D.   On: 10/03/2023 15:48   DG Hip Unilat W or Wo Pelvis 2-3 Views Right Result Date: 10/03/2023 CLINICAL DATA:  Right hip pain EXAM: DG HIP (WITH OR WITHOUT PELVIS) 2-3V RIGHT COMPARISON:  None Available. FINDINGS: Osteopenia.No evidence of pelvic fracture or diastasis.No acute hip fracture or dislocation.There is no evidence of arthropathy or other focal bone abnormality.Soft tissues are unremarkable. IMPRESSION: No acute fracture, pelvic bone diastasis, or dislocation. Electronically Signed  By: Rogelia Myers M.D.   On: 10/03/2023 13:58      IMPRESSION AND PLAN:  Assessment and Plan: No notes have been filed under this hospital service. Service: Hospitalist      DVT prophylaxis: Lovenox ***  Advanced Care Planning:  Code Status: full code***  Family Communication:  The plan of care was  discussed in details with the patient (and family). I answered all questions. The patient agreed to proceed with the above mentioned plan. Further management will depend upon hospital course. Disposition Plan: Back to previous home environment Consults called: none***  All the records are reviewed and case discussed with ED provider.  Status is: Observation {Observation:23811}   At the time of the admission, it appears that the appropriate admission status for this patient is inpatient.  This is judged to be reasonable and necessary in order to provide the required intensity of service to ensure the patient's safety given the presenting symptoms, physical exam findings and initial radiographic and laboratory data in the context of comorbid conditions.  The patient requires inpatient status due to high intensity of service, high risk of further deterioration and high frequency of surveillance required.  I certify that at the time of admission, it is my clinical judgment that the patient will require inpatient hospital care extending more than 2 midnights.                            Dispo: The patient is from: Home              Anticipated d/c is to: Home              Patient currently is not medically stable to d/c.              Difficult to place patient: No  Madison DELENA Peaches M.D on 10/03/2023 at 11:09 PM  [facility]   From 7 PM-7 AM, contact night-coverage [email or web address]  CC: Primary care physician; Alla Amis, MD

## 2023-10-03 NOTE — ED Provider Notes (Signed)
 [facility] Provider Note    Event Date/Time   First MD Initiated Contact with Patient 10/03/23 1457     (approximate)   History   Altered Mental Status   HPI  Chad Sandoval is a 78 y.o. male who presents to the ED for evaluation of Altered Mental Status   Reviewed PCP visit from 3 days ago.  Seen for a rash in the setting of recent antibiotic use (Bactrim) prescribed for UTI.  Switched to doxycycline.  Otherwise history of Parkinson's disease, DM, HLD  Who presents to the ED with his daughter and wife for evaluation of altered mentation acutely worsening today, superimposed on about 2 weeks of more rapid decline.  Reports that he has been generally declining with his Parkinson's disease, but significantly worse in the past 2 weeks, and today was dramatically worse.  He was mumbling, speech was incoherent, he was making no sense.  He is typically ambulatory around the house with a walker but he has had multiple falls in the past 2 days.   Rash to his back is improving/resolving after cessation of Bactrim   Physical Exam   Triage Vital Signs: ED Triage Vitals  Encounter Vitals Group     BP 10/03/23 1246 (!) 132/97     Girls Systolic BP Percentile --      Girls Diastolic BP Percentile --      Boys Systolic BP Percentile --      Boys Diastolic BP Percentile --      Pulse Rate 10/03/23 1246 95     Resp 10/03/23 1246 16     Temp 10/03/23 1246 97.6 F (36.4 C)     Temp Source 10/03/23 1246 Oral     SpO2 10/03/23 1246 97 %     Weight 10/03/23 1249 195 lb (88.5 kg)     Height 10/03/23 1249 5' 9 (1.753 m)     Head Circumference --      Peak Flow --      Pain Score 10/03/23 1248 6     Pain Loc --      Pain Education --      Exclude from Growth Chart --     Most recent vital signs: Vitals:   10/03/23 1800 10/03/23 1933  BP: (!) 147/87 124/76  Pulse:  89  Resp:  19  Temp:  98 F (36.7 C)  SpO2:  99%    General: Awake, no distress.   Parkinsonism CV:  Good peripheral perfusion.  Resp:  Normal effort.  Abd:  No distention.  Soft and benign MSK:  No deformity noted.  Faint and scattered circular areas of flat erythema to the back without clear signs of trauma Neuro:  No focal deficits appreciated. Other:     ED Results / Procedures / Treatments   Labs (all labs ordered are listed, but only abnormal results are displayed) Labs Reviewed  COMPREHENSIVE METABOLIC PANEL WITH GFR - Abnormal; Notable for the following components:      Result Value   Glucose, Bld 122 (*)    Alkaline Phosphatase 164 (*)    All other components within normal limits  CBC - Abnormal; Notable for the following components:   WBC 16.4 (*)    All other components within normal limits  URINALYSIS, ROUTINE W REFLEX MICROSCOPIC - Abnormal; Notable for the following components:   Color, Urine STRAW (*)    APPearance CLEAR (*)    Hgb urine dipstick LARGE (*)  Bacteria, UA RARE (*)    All other components within normal limits  CULTURE, BLOOD (ROUTINE X 2)  CULTURE, BLOOD (ROUTINE X 2)  URINE CULTURE  LACTIC ACID, PLASMA  PROCALCITONIN    EKG   RADIOLOGY Plain film of the pelvis and right hip interpreted me without signs of fracture or dislocation CXR interpreted by me without evidence of acute cardiopulmonary pathology.  Official radiology report(s): CT HEAD WO CONTRAST ( ) Result Date: 10/03/2023 CLINICAL DATA:  nonfocal altered EXAM: CT HEAD WITHOUT CONTRAST TECHNIQUE: Contiguous axial images were obtained from the base of the skull through the vertex without intravenous contrast. RADIATION DOSE REDUCTION: This exam was performed according to the departmental dose-optimization program which includes automated exposure control, adjustment of the mA and/or kV according to patient size and/or use of iterative reconstruction technique. COMPARISON:  June 23, 2019 FINDINGS: Brain: Proportional prominence of the ventricles and sulci,  consistent with diffuse cerebral parenchymal volume loss. The ventricles otherwise maintained midline position without midline shift. Gray-white differentiation is preserved.Scattered periventricular white matter hypoattenuation, most consistent with changes of mild chronic ischemic microvascular disease.No evidence of acute territorial infarction, extra-axial fluid collection, hemorrhage, or mass lesion. The basilar cisterns are patent without downward herniation. The cerebellar hemispheres and vermis are well formed without mass lesion or focal attenuation abnormality. Vascular: No hyperdense vessel. Calcified atherosclerotic plaque within the cavernous/supraclinoid ICA and intradural vertebral arteries. Skull: Normal. Negative for fracture or focal lesion. Sinuses/Orbits: The paranasal sinuses and mastoids are clear.The globes appear intact. No retrobulbar hematoma. Other: None. IMPRESSION: No acute intracranial abnormality, specifically, no acute hemorrhage, territorial infarction, or intracranial mass. Electronically Signed   By: Rogelia Myers M.D.   On: 10/03/2023 16:47   DG Chest Portable 1 View Result Date: 10/03/2023 CLINICAL DATA:  Sepsis. EXAM: PORTABLE CHEST 1 VIEW COMPARISON:  09/13/2022 FINDINGS: The heart size and mediastinal contours are within normal limits. Both lungs are clear. The visualized skeletal structures are unremarkable. IMPRESSION: No active disease. Electronically Signed   By: Elspeth Bathe M.D.   On: 10/03/2023 15:48   DG Hip Unilat W or Wo Pelvis 2-3 Views Right Result Date: 10/03/2023 CLINICAL DATA:  Right hip pain EXAM: DG HIP (WITH OR WITHOUT PELVIS) 2-3V RIGHT COMPARISON:  None Available. FINDINGS: Osteopenia.No evidence of pelvic fracture or diastasis.No acute hip fracture or dislocation.There is no evidence of arthropathy or other focal bone abnormality.Soft tissues are unremarkable. IMPRESSION: No acute fracture, pelvic bone diastasis, or dislocation. Electronically Signed    By: Rogelia Myers M.D.   On: 10/03/2023 13:58    PROCEDURES and INTERVENTIONS:  .1-3 Lead EKG Interpretation  Performed by: Claudene Rover, MD Authorized by: Claudene Rover, MD     Interpretation: normal     ECG rate:  92   ECG rate assessment: normal     Rhythm: sinus rhythm     Ectopy: none     Conduction: normal     Medications  lactated ringers  bolus 1,000 mL (0 mLs Intravenous Stopped 10/03/23 1827)     IMPRESSION / MDM / ASSESSMENT AND PLAN / ED COURSE  I reviewed the triage vital signs and the nursing notes.  Differential diagnosis includes, but is not limited to, deconditioning or progression of Parkinson's disease, sepsis, drug reaction, renal failure, pyelonephritis, stroke, AKI  {Patient presents with symptoms of an acute illness or injury that is potentially life-threatening.  Patient with Parkinson disease presents with acutely worsening nonfocal altered mentation, dysarthria.  Outside the window for code stroke activation.  Reassuring CT head without signs of ICH or clear acute pathology, pending MRI brain.  He has a chronic leukocytosis, normal metabolic panel, negative procalcitonin and lactic acid.  Cultures are sent.  No clear indication for antibiotics at this point.  Consult with medicine for admission.  Reassuring imaging.  Clinical Course as of 10/03/23 2014  Sat Oct 03, 2023  1534 Mumbling nonsense today, drastically more confused and weak [DS]  1912 Reassessed, discussed workup so far, pending UA and plan for admission. [DS]  2013 Reassessed and discussed workup overall, urinalysis with microscopic materia.  Pending MRI, admission. [DS]    Clinical Course User Index [DS] Claudene Rover, MD     FINAL CLINICAL IMPRESSION(S) / ED DIAGNOSES   Final diagnoses:  Altered mental status, unspecified altered mental status type     Rx / DC Orders   ED Discharge Orders     None        Note:  This document was prepared using Dragon voice recognition  software and may include unintentional dictation errors.   Claudene Rover, MD 10/03/23 [id number]

## 2023-10-03 NOTE — ED Notes (Signed)
 Pt's brief and bedding changed. Family at bedside.

## 2023-10-04 ENCOUNTER — Observation Stay

## 2023-10-04 DIAGNOSIS — I10 Essential (primary) hypertension: Secondary | ICD-10-CM | POA: Insufficient documentation

## 2023-10-04 DIAGNOSIS — E785 Hyperlipidemia, unspecified: Secondary | ICD-10-CM | POA: Insufficient documentation

## 2023-10-04 DIAGNOSIS — G20A1 Parkinson's disease without dyskinesia, without mention of fluctuations: Secondary | ICD-10-CM | POA: Diagnosis not present

## 2023-10-04 DIAGNOSIS — G9340 Encephalopathy, unspecified: Secondary | ICD-10-CM | POA: Diagnosis not present

## 2023-10-04 DIAGNOSIS — E1142 Type 2 diabetes mellitus with diabetic polyneuropathy: Secondary | ICD-10-CM | POA: Insufficient documentation

## 2023-10-04 LAB — PROTIME-INR
INR: 1.1 (ref 0.8–1.2)
Prothrombin Time: 15.1 s (ref 11.4–15.2)

## 2023-10-04 LAB — BASIC METABOLIC PANEL WITH GFR
Anion gap: 6 (ref 5–15)
BUN: 15 mg/dL (ref 8–23)
CO2: 27 mmol/L (ref 22–32)
Calcium: 9.2 mg/dL (ref 8.9–10.3)
Chloride: 107 mmol/L (ref 98–111)
Creatinine, Ser: 0.86 mg/dL (ref 0.61–1.24)
GFR, Estimated: >60 mL/min (ref >60)
Glucose, Bld: 101 mg/dL — ABNORMAL HIGH (ref 70–99)
Potassium: 3.9 mmol/L (ref 3.5–5.1)
Sodium: 140 mmol/L (ref 135–145)

## 2023-10-04 LAB — CBC
HCT: 44 % (ref 39.0–52.0)
Hemoglobin: 14.7 g/dL (ref 13.0–17.0)
MCH: 31.7 pg (ref 26.0–34.0)
MCHC: 33.4 g/dL (ref 30.0–36.0)
MCV: 94.8 fL (ref 80.0–100.0)
Platelets: 302 K/uL (ref 150–400)
RBC: 4.64 MIL/uL (ref 4.22–5.81)
RDW: 13 % (ref 11.5–15.5)
WBC: 17.1 K/uL — ABNORMAL HIGH (ref 4.0–10.5)
nRBC: 0 % (ref 0.0–0.2)

## 2023-10-04 LAB — CORTISOL-AM, BLOOD: Cortisol - AM: 8.9 ug/dL (ref 6.7–22.6)

## 2023-10-04 MED ORDER — ASPIRIN 81 MG PO TBEC
81.0000 mg | DELAYED_RELEASE_TABLET | Freq: Every day | ORAL | Status: DC
  Administered 2023-10-04 – 2023-10-05 (×3): 81 mg via ORAL
  Filled 2023-10-04 (×2): qty 1

## 2023-10-04 MED ORDER — IOHEXOL 350 MG/ML SOLN
75.0000 mL | Freq: Once | INTRAVENOUS | Status: AC | PRN
Start: 2023-10-04 — End: 2023-10-04
  Administered 2023-10-04: 75 mL via INTRAVENOUS

## 2023-10-04 NOTE — Progress Notes (Signed)
 PROGRESS NOTE    Chad Sandoval  FMW:[id number] DOB: [birth year] DOA: 10/03/2023 PCP: Alla Amis, MD  Chief Complaint  Patient presents with   Altered Mental Status    Hospital Course:  Chad Sandoval is a 78 year old male with hypertension, Parkinson's disease, splenic marginal zone lymphoma status postsplenectomy, who presents to the ED with altered mentation.  Patient had a fall on Thursday without head injury and reportedly slid out of the bed on day prior to arrival.  He could not get up off of the ground.  Patient was recently treated for UTI 1 week ago and had reaction to Bactrim.  He was then switched to doxycycline which he has been taking as prescribed.  In the ED labs and vitals were unremarkable.  CBC showed leukocytosis of 16.4, UA with RBCs and rare bacteria.  Urine culture was sent, blood cultures were sent.  MRI was performed and revealed no acute intracranial abnormalities but did reveal mild age-related cerebral atrophy with chronic small vessel ischemic disease.  It also revealed loss of normal flow within the mid distal right V4 segment that could reflect slow flow or occlusion.  Patient was admitted for observation.  Subjective: This morning patient's daughter is at bedside.  She reports that he is still confused beyond baseline.  She does report that he has had slow gradual decline outpatient but that this is worse than normal.  She also reports he has not been sleeping recently and believes that has been about 3 days since he has had a true night sleep. Patient is drowsy but arousable.  He denies any acute pain or complaints  Objective: Vitals:   10/04/23 0045 10/04/23 0438 10/04/23 0823 10/04/23 1206  BP: (!) 144/83 125/77 124/75 122/82  Pulse: 88 93 100 (!) 103  Resp: 20 18 20 20   Temp: 97.8 F (36.6 C) 98.2 F (36.8 C) (!) 97.3 F (36.3 C) 99.4 F (37.4 C)  TempSrc: Oral Oral    SpO2: 97% 95% 93% 96%  Weight:      Height:        Intake/Output  Summary (Last 24 hours) at 10/04/2023 1427 Last data filed at 10/04/2023 1300 Gross per 24 hour  Intake 720 ml  Output 900 ml  Net -180 ml   Filed Weights   10/03/23 1249  Weight: 88.5 kg    Examination: General exam: Appears calm and comfortable, NAD  Respiratory system: No work of breathing, symmetric chest wall expansion Cardiovascular system: S1 & S2 heard, RRR.  Gastrointestinal system: Abdomen is nondistended, soft and nontender.  Neuro: Drowsy but arousable. Requires prompting to answer questions. Speech incoherent  Extremities: Symmetric, expected ROM Skin: No rashes, lesions  Assessment & Plan:  Principal Problem:   Acute encephalopathy Active Problems:   Parkinson's disease (HCC)   Dyslipidemia   Type 2 diabetes mellitus with peripheral neuropathy (HCC)   Essential hypertension   Acute encephalopathy - Differential remains broad.  Infection versus progression of Parkinson's versus medication side effect - Recent UTI, has been on antibiotics.  On arrival has tachycardia, tachypnea, leukocytosis. - UA with RBCs and rare bacteria.  On ceftriaxone  which we will continue - Follow urine and blood cultures - Continue IVF with LR - MRI brain without acute abnormalities but does show slow flow and possible occlusion.  Have discussed this with neurology.  Unlikely to be causing symptoms.  Will obtain CTA head and neck for better evaluation - PT/OT/SLP - Encourage sleep-wake cycle - Delirium precautions  Chronic  leukocytosis Status post splenectomy Lymphoma - Patient's daughter reports patient has chronic leukocytosis secondary to splenectomy and lymphoma - Currently on antibiotics as above.  Continue to monitor CBC  Hypertension - Continue antihypertensive therapy, titrate as needed  Type 2 diabetes with peripheral neuropathy - Continue home meds for now.  Hold metformin. - Sliding scale insulin, titrate daily  Dyslipidemia - Continue statin  Parkinson's  disease - Continue with Sinemet .   DVT prophylaxis: Lovenox    Code Status: Full Code Disposition: Inpatient pending clinical resolution.  Consultants:    Procedures:    Antimicrobials:  Anti-infectives (From admission, onward)    Start     Dose/Rate Route Frequency Ordered Stop   10/03/23 2200  cefTRIAXone  (ROCEPHIN ) 2 g in sodium chloride  0.9 % 100 mL IVPB        2 g 200 mL/hr over 30 Minutes Intravenous Every 24 hours 10/03/23 2141 10/10/23 2159       Data Reviewed: I have personally reviewed following labs and imaging studies CBC: Recent Labs  Lab 10/03/23 1247 10/04/23 0421  WBC 16.4* 17.1*  HGB 16.0 14.7  HCT 47.6 44.0  MCV 95.2 94.8  PLT 321 302   Basic Metabolic Panel: Recent Labs  Lab 10/03/23 1247 10/04/23 0421  NA 135 140  K 4.3 3.9  CL 104 107  CO2 25 27  GLUCOSE 122* 101*  BUN 21 15  CREATININE 0.98 0.86  CALCIUM 9.7 9.2   GFR: Estimated Creatinine Clearance: 77.9 mL/min (by C-G formula based on SCr of 0.86 mg/dL). Liver Function Tests: Recent Labs  Lab 10/03/23 1247  AST 21  ALT <5  ALKPHOS 164*  BILITOT 0.9  PROT 7.1  ALBUMIN 4.2   CBG: No results for input(s): GLUCAP in the last 168 hours.  Recent Results (from the past 240 hours)  Blood culture (routine x 2)     Status: None (Preliminary result)   Collection Time: 10/03/23  3:38 PM   Specimen: BLOOD  Result Value Ref Range Status   Specimen Description BLOOD RIGHT ANTECUBITAL  Final   Special Requests   Final    BOTTLES DRAWN AEROBIC AND ANAEROBIC Blood Culture adequate volume   Culture   Final    NO GROWTH < 12 HOURS Performed at [facility], [address], [place], [place] [address]    Report Status PENDING  Incomplete  Blood culture (routine x 2)     Status: None (Preliminary result)   Collection Time: 10/03/23  3:39 PM   Specimen: BLOOD LEFT HAND  Result Value Ref Range Status   Specimen Description BLOOD LEFT HAND  Final   Special Requests   Final     BOTTLES DRAWN AEROBIC AND ANAEROBIC Blood Culture results may not be optimal due to an inadequate volume of blood received in culture bottles   Culture   Final    NO GROWTH < 12 HOURS Performed at [facility], [address], [place], [place] [address]    Report Status PENDING  Incomplete     Radiology Studies: MR BRAIN WO CONTRAST Result Date: 10/03/2023 CLINICAL DATA:  Initial evaluation for acute dysarthria. EXAM: MRI HEAD WITHOUT CONTRAST TECHNIQUE: Multiplanar, multiecho pulse sequences of the brain and surrounding structures were obtained without intravenous contrast. COMPARISON:  CT from earlier the same day. FINDINGS: Brain: Mild age-related cerebral atrophy. Patchy T2/FLAIR hyperintensity involving the periventricular and deep white matter both cerebral hemispheres, most likely related chronic microvascular ischemic disease, mild for age. No abnormal foci of  restricted diffusion to suggest acute or subacute ischemia. Gray-white matter differentiation maintained. No areas of chronic cortical infarction. No acute or chronic intracranial blood products. No mass lesion, midline shift or mass effect. No hydrocephalus or extra-axial fluid collection. Pituitary gland within normal limits. Vascular: Loss of normal flow void within the mid-distal right V4 segment, which could reflect slow flow and/or occlusion. Left vertebral artery mildly tortuous and partially invaginate upon the medulla. Major intracranial vascular flow voids are otherwise maintained. Skull and upper cervical spine: Craniocervical junction within normal limits. Bone marrow signal intensity overall within normal limits. No scalp soft tissue abnormality. Sinuses/Orbits: Prior bilateral ocular lens replacement. Paranasal sinuses are largely clear. No significant mastoid effusion. Other: None. IMPRESSION: 1. No acute intracranial abnormality. 2. Mild age-related cerebral atrophy with chronic small vessel ischemic disease. 3.  Loss of normal flow void within the mid-distal right V4 segment, which could reflect slow flow and/or occlusion. Electronically Signed   By: Morene Hoard M.D.   On: 10/03/2023 21:54   CT HEAD WO CONTRAST ( ) Result Date: 10/03/2023 CLINICAL DATA:  nonfocal altered EXAM: CT HEAD WITHOUT CONTRAST TECHNIQUE: Contiguous axial images were obtained from the base of the skull through the vertex without intravenous contrast. RADIATION DOSE REDUCTION: This exam was performed according to the departmental dose-optimization program which includes automated exposure control, adjustment of the mA and/or kV according to patient size and/or use of iterative reconstruction technique. COMPARISON:  June 23, 2019 FINDINGS: Brain: Proportional prominence of the ventricles and sulci, consistent with diffuse cerebral parenchymal volume loss. The ventricles otherwise maintained midline position without midline shift. Gray-white differentiation is preserved.Scattered periventricular white matter hypoattenuation, most consistent with changes of mild chronic ischemic microvascular disease.No evidence of acute territorial infarction, extra-axial fluid collection, hemorrhage, or mass lesion. The basilar cisterns are patent without downward herniation. The cerebellar hemispheres and vermis are well formed without mass lesion or focal attenuation abnormality. Vascular: No hyperdense vessel. Calcified atherosclerotic plaque within the cavernous/supraclinoid ICA and intradural vertebral arteries. Skull: Normal. Negative for fracture or focal lesion. Sinuses/Orbits: The paranasal sinuses and mastoids are clear.The globes appear intact. No retrobulbar hematoma. Other: None. IMPRESSION: No acute intracranial abnormality, specifically, no acute hemorrhage, territorial infarction, or intracranial mass. Electronically Signed   By: Rogelia Myers M.D.   On: 10/03/2023 16:47   DG Chest Portable 1 View Result Date: 10/03/2023 CLINICAL DATA:   Sepsis. EXAM: PORTABLE CHEST 1 VIEW COMPARISON:  09/13/2022 FINDINGS: The heart size and mediastinal contours are within normal limits. Both lungs are clear. The visualized skeletal structures are unremarkable. IMPRESSION: No active disease. Electronically Signed   By: Elspeth Bathe M.D.   On: 10/03/2023 15:48   DG Hip Unilat W or Wo Pelvis 2-3 Views Right Result Date: 10/03/2023 CLINICAL DATA:  Right hip pain EXAM: DG HIP (WITH OR WITHOUT PELVIS) 2-3V RIGHT COMPARISON:  None Available. FINDINGS: Osteopenia.No evidence of pelvic fracture or diastasis.No acute hip fracture or dislocation.There is no evidence of arthropathy or other focal bone abnormality.Soft tissues are unremarkable. IMPRESSION: No acute fracture, pelvic bone diastasis, or dislocation. Electronically Signed   By: Rogelia Myers M.D.   On: 10/03/2023 13:58    Scheduled Meds:  amantadine   100 mg Oral TID   aspirin  EC  81 mg Oral Daily   carbidopa -levodopa   1 tablet Oral QHS   carbidopa -levodopa   2 tablet Oral QID   enoxaparin  (LOVENOX ) injection  40 mg Subcutaneous Q24H   glycopyrrolate   0.5 mg Oral BID   multivitamin with minerals  1 tablet Oral Daily   pentosan polysulfate  100 mg Oral TID   pravastatin   20 mg Oral q1800   QUEtiapine   50 mg Oral QHS   venlafaxine   37.5 mg Oral BID   Continuous Infusions:  cefTRIAXone  (ROCEPHIN )  IV Stopped (10/03/23 2345)   lactated ringers  150 mL/hr (10/04/23 0642)     LOS: 0 days  MDM: Patient is high risk for one or more organ failure.  They necessitate ongoing hospitalization for continued IV therapies and subsequent lab monitoring. Total time spent interpreting labs and vitals, reviewing the medical record, coordinating care amongst consultants and care team members, directly assessing and discussing care with the patient and/or family: 55 min  Jamiaya Bina, DO [facility]  To contact the attending physician between 7A-7P please use Epic Chat. To contact the covering  physician during after hours 7P-7A, please review Amion.  10/04/2023, 2:27 PM   *This document has been created with the assistance of dictation software. Please excuse typographical errors. *

## 2023-10-04 NOTE — Assessment & Plan Note (Signed)
-   The patient will be placed on supplemental coverage with NovoLog. - Will hold off metformin. - Will continue Neurontin.

## 2023-10-04 NOTE — Assessment & Plan Note (Signed)
Will continue Neurontin.

## 2023-10-04 NOTE — Care Management Obs Status (Signed)
 MEDICARE OBSERVATION STATUS NOTIFICATION   Patient Details  Name: Chad Sandoval MRN: [id number] Date of Birth: April 30, 1945   Medicare Observation Status Notification Given:  Yes    Shaday Rayborn W, CMA 10/04/2023, 10:05 AM

## 2023-10-04 NOTE — Evaluation (Signed)
 Occupational Therapy Evaluation Patient Details Name: Chad Sandoval MRN: [id number] DOB: 21-Nov-1945 Today's Date: 10/04/2023   History of Present Illness   Chad Sandoval is a 78 y.o. 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  The patient had a fall on Thursday without head injury and slid out of bed today.  He could not get up from the ground per his wife. He has been treated for UTI a couple weeks ago and had a reaction to Bactrim then was switched to doxycycline which he has been taking over the last couple of days. No fever or chills. No nausea or vomiting or abdominal pain. No cough or wheezing or dyspnea.  No chest pain or palpitations. No new paresthesias or focal muscle weakness.     Clinical Impressions Mr. [name] presents this date with altered mental status, limited responsiveness, generalized weakness, and greatly reduced functional mobility. Prior to admission, pt had been living at home, receiving PRN assistance from his wife for B/IADLs, with wife reporting she particularly had to help her husband get out of bed each morning, but that once he got up, he was able to perform most tasks with Mod I, using a rollator for ambulation. She reports that, approximately 2 weeks ago, pt begin to require much more assistance, and within the past few days, his speech has been garbled and nonsensical, he has had limited responsiveness, and he has experienced several falls. Pt also experienced a bad fall ~ 3 months ago. During today's evaluation, pt is unaware where he is, is unable to give the names of his wife or his daughters, cannot state the current year or his birth year (although is able to correct state his birth date and month). He follows  directions to squeeze therapist's hands. Asked to raise his arms, pt puts L arm up but not R. He does not respond to requests to move his lower extremities. He requires Max A +2 for rolling L to R in bed and for supine<>sit transfers, and he is unable to maintain sitting balance or to come into standing. Pt keeps eyes closed for most of session; when he does open his eyes he does not focus on anyone in the room; rather, he appears to stare at the ceiling or wall. Recommend ongoing OT while pt is hospitalized. Given the high level of assistance pt is requiring for any movement at present, pt's wife would be unable to meet his needs at home. Recommend continuing rehab 5 days/week, < 3 hours/day. Wife, however, is very tearful during a discussion of DC recs, and states that she intends for pt to DC home.      If plan is discharge home, recommend the following:   Two people to help with walking and/or transfers;Two people to help with bathing/dressing/bathroom;Assistance with feeding;Assistance with cooking/housework;Supervision due to cognitive status;Assist for transportation;Help with stairs or ramp for entrance     Functional Status Assessment   Patient has had a recent decline in their functional status and demonstrates the ability to make significant improvements in function in a reasonable and predictable amount of time.     Equipment Recommendations   None recommended by OT     Recommendations for Other Services         Precautions/Restrictions   Precautions Precautions: Fall Restrictions  Weight Bearing Restrictions Per Provider Order: No     Mobility Bed Mobility Overal bed mobility: Needs Assistance Bed Mobility: Supine to Sit, Sit to Supine     Supine to sit: Max assist, +2 for physical assistance, HOB elevated Sit to supine: Max assist, +2 for physical assistance, HOB elevated        Transfers                   General transfer comment: Pt unable       Balance Overall balance assessment: Needs assistance   Sitting balance-Leahy Scale: Poor       Standing balance-Leahy Scale: Zero                             ADL either performed or assessed with clinical judgement   ADL Overall ADL's : Needs assistance/impaired                                       General ADL Comments: Pt currently requiring Max A for all BADLs     Vision         Perception         Praxis         Pertinent Vitals/Pain Pain Assessment Pain Assessment: PAINAD Breathing: occasional labored breathing, short period of hyperventilation Negative Vocalization: none Facial Expression: sad, frightened, frown Body Language: tense, distressed pacing, fidgeting Consolability: distracted or reassured by voice/touch PAINAD Score: 4 Pain Intervention(s): Repositioned, Utilized relaxation techniques     Extremity/Trunk Assessment Upper Extremity Assessment Upper Extremity Assessment: Generalized weakness;RUE deficits/detail;LUE deficits/detail RUE Deficits / Details: bilateral tremors RUE Coordination: decreased fine motor;decreased gross motor LUE Deficits / Details: bilateral tremors LUE Coordination: decreased fine motor;decreased gross motor   Lower Extremity Assessment Lower Extremity Assessment: Generalized weakness;RLE deficits/detail;LLE deficits/detail RLE Deficits / Details: bilateral tremors LLE Deficits / Details: bilateral tremors       Communication Communication Communication: Impaired Factors Affecting Communication: Other (comment) (pt is non-responsive to most questions or directions this AM)   Cognition Arousal: Obtunded Behavior During Therapy: Restless Cognition: Difficult to assess Difficult to assess due to: Impaired communication           OT - Cognition Comments: Asked if he knows where he is, pt responds no. Asked to state his birthdate, pt correctly gives day and month but give  incorrect year. Cannot identify current year. Unable to state names of his wife or daughters, who are present in room.                 Following commands: Impaired Following commands impaired: Follows one step commands inconsistently     Cueing  General Comments          Exercises Other Exercises Other Exercises: Family educ re: role of OT, POC, DC recs   Shoulder Instructions      Home Living Family/patient expects to be discharged to:: Private residence Living Arrangements: Spouse/significant other Available Help at Discharge: Family Type of Home: House Home Access: Ramped entrance     Home Layout: One level     Bathroom Shower/Tub: Arts development officer Toilet: Handicapped height     Home Equipment: Agricultural consultant (2 wheels);Rollator (4 wheels);Grab bars - tub/shower;Shower seat          Prior Functioning/Environment Prior Level of Function : Needs assist  Mobility Comments: Per pt's wife, pt takes a long time to get up and moving in the AM, but after his stiffness subsides, he is able to ambulate with rollator. ADLs Comments: Pt's wife assists with all ADLs, with pt requiring particular assistance for bed transfers in the AM. Wife assists with showering. Pt requires assist for putting legs into and out of car when going to appts or twice weekly visits to the gym.    OT Problem List: Impaired balance (sitting and/or standing);Decreased activity tolerance;Decreased cognition;Decreased range of motion;Decreased strength;Impaired UE functional use;Impaired tone   OT Treatment/Interventions: Self-care/ADL training;Therapeutic exercise;Patient/family education;Balance training;Therapeutic activities;DME and/or AE instruction;Cognitive remediation/compensation      OT Goals(Current goals can be found in the care plan section)   Acute Rehab OT Goals Patient Stated Goal: for pt to be able to go home OT Goal Formulation: With family Time  For Goal Achievement: 10/18/23 Potential to Achieve Goals: Good ADL Goals Pt Will Perform Grooming: sitting;with contact guard assist (with set-up and cueing PRN) Pt Will Transfer to Toilet: with mod assist;bedside commode;stand pivot transfer Pt/caregiver will Perform Home Exercise Program: With minimal assist (Performing his regular 2x/weekly routine) Additional ADL Goal #1: Pt will perform bed mobility (supine<>sit transfers) with Min A   OT Frequency:  Min 2X/week    Co-evaluation              AM-PAC OT 6 Clicks Daily Activity     Outcome Measure Help from another person eating meals?: A Lot Help from another person taking care of personal grooming?: A Lot Help from another person toileting, which includes using toliet, bedpan, or urinal?: A Lot Help from another person bathing (including washing, rinsing, drying)?: A Lot Help from another person to put on and taking off regular upper body clothing?: A Lot Help from another person to put on and taking off regular lower body clothing?: A Lot 6 Click Score: 12   End of Session    Activity Tolerance: Patient limited by lethargy Patient left: in bed;with call bell/phone within reach;with family/visitor present  OT Visit Diagnosis: Other abnormalities of gait and mobility (R26.89);Repeated falls (R29.6);Muscle weakness (generalized) (M62.81);Ataxia, unspecified (R27.0);Apraxia (R48.2);History of falling (Z91.81);Other symptoms and signs involving cognitive function;Cognitive communication deficit (R41.841)                Time: 8957-8893 OT Time Calculation (min): 24 min Charges:  OT General Charges $OT Visit: 1 Visit OT Evaluation $OT Eval High Complexity: 1 High OT Treatments $Self Care/Home Management : 8-22 mins Suzen Hock, PhD, MS, OTR/L 10/04/23, 12:15 PM

## 2023-10-04 NOTE — Assessment & Plan Note (Addendum)
-   This could be related to residual UTI with subsequent sepsis given tachycardia, tachypnea and leukocytosis. - The patient will be admitted to an observation medical telemetry bed. - Will place him on IV Rocephin  and follow urine and blood cultures. - He will be hydrated with IV lactated ringer . - Will follow neurochecks every 4 hours for 24 hours. - PT, OT and ST consult to be obtained given his generalized weakness.

## 2023-10-04 NOTE — Assessment & Plan Note (Signed)
-   Will continue his Sinemet  CR and IR.

## 2023-10-04 NOTE — Evaluation (Signed)
 Physical Therapy Evaluation Patient Details Name: Chad Sandoval MRN: [id number] DOB: 1945-12-03 Today's Date: 10/04/2023  History of Present Illness  Chad Sandoval is a 78yoM who comes to [facility] on 10/03/23 with progressive AMS, recent UTI, recent falls at home. PMH: parkinson's disease, DM, HLD. At baseline pt was participating in [place] classes for people with PD.  Clinical Impression  Pt in bed on entry, awake now bu tper DTR Leigh only somewhat more oriented compared to this morning. Pt is oriented to DTR in room. Pt requires +2 physical assistance for bed mobility, however family has a hoyer lift at home if needed and prefers to have pt DC to that location. Pt able to come to standing 3x with significant effort, but poorly tolerated, quite fatigued overall, only tolerates time on feet of ~30 sec max, whether with support from author or use of RW. Pt remains far off baseline, heavy physical needs, but DTR Leigh endorses ability for pt to be cared for in the home at DC.       If plan is discharge home, recommend the following: Two people to help with walking and/or transfers;A lot of help with bathing/dressing/bathroom;Supervision due to cognitive status;Assist for transportation;Assistance with feeding   Can travel by private vehicle        Equipment Recommendations None recommended by PT  Recommendations for Other Services       Functional Status Assessment Patient has had a recent decline in their functional status and demonstrates the ability to make significant improvements in function in a reasonable and predictable amount of time.     Precautions / Restrictions Precautions Precautions: Fall Restrictions Weight Bearing Restrictions Per Provider Order: No      Mobility  Bed Mobility Overal bed mobility: Needs Assistance Bed Mobility: Supine to Sit, Sit to Supine     Supine to sit: Max assist, +2 for physical assistance, HOB elevated Sit to supine: Max assist, +2  for physical assistance, HOB elevated        Transfers Overall transfer level: Needs assistance Equipment used: Rolling walker (2 wheels), 1 person hand held assist Transfers: Sit to/from Stand Sit to Stand: Min assist           General transfer comment: heavy minA for 2 dependent transfers and 1 HHA transfer.    Ambulation/Gait Ambulation/Gait assistance:  (too weak to attempt at this time.)                Stairs            Wheelchair Mobility     Tilt Bed    Modified Rankin (Stroke Patients Only)       Balance                                             Pertinent Vitals/Pain Pain Assessment Pain Assessment: No/denies pain    Home Living Family/patient expects to be discharged to:: Private residence Living Arrangements: Spouse/significant other Available Help at Discharge: Family Type of Home: House Home Access: Ramped entrance       Home Layout: One level Home Equipment: Agricultural consultant (2 wheels);Rollator (4 wheels);Grab bars - tub/shower;Shower seat;Hospital bed      Prior Function Prior Level of Function : Needs assist             Mobility Comments: Per pt's wife, pt takes a long time  to get up and moving in the AM, but after his stiffness subsides, he is able to ambulate with rollator. ADLs Comments: Pt's wife assists with all ADLs, with pt requiring particular assistance for bed transfers in the AM. Wife assists with showering. Pt requires assist for putting legs into and out of car when going to appts or twice weekly visits to the gym.     Extremity/Trunk Assessment   Upper Extremity Assessment Upper Extremity Assessment: Generalized weakness;RUE deficits/detail;LUE deficits/detail RUE Deficits / Details: bilateral tremors RUE Coordination: decreased fine motor;decreased gross motor LUE Deficits / Details: bilateral tremors LUE Coordination: decreased fine motor;decreased gross motor    Lower Extremity  Assessment Lower Extremity Assessment: Generalized weakness;RLE deficits/detail;LLE deficits/detail RLE Deficits / Details: bilateral tremors LLE Deficits / Details: bilateral tremors       Communication   Communication Communication: Impaired Factors Affecting Communication: Other (comment) (pt is non-responsive to most questions or directions this AM)    Cognition Arousal: Alert Behavior During Therapy: Flat affect                           PT - Cognition Comments: orientation better than morning, but still seems somewhat confused/delayed.         Cueing       General Comments      Exercises Other Exercises Other Exercises: dependent STS from EOB, minA frm elevated EOB with sustained standing to failure 3x: used RW third time with maxA transition of hands to and from RW once already up (poor standing tolerance, remains up for ~30sec or less.)   Assessment/Plan    PT Assessment Patient needs continued PT services  PT Problem List Decreased strength;Decreased range of motion;Decreased activity tolerance;Decreased balance;Decreased mobility;Decreased safety awareness;Decreased knowledge of use of DME       PT Treatment Interventions DME instruction;Gait training;Stair training;Functional mobility training;Therapeutic activities;Therapeutic exercise;Balance training;Patient/family education    PT Goals (Current goals can be found in the Care Plan section)  Acute Rehab PT Goals Patient Stated Goal: be able to return to home and rehabilitate there. PT Goal Formulation: With family Time For Goal Achievement: 10/04/23 Potential to Achieve Goals: Good    Frequency Min 2X/week     Co-evaluation               AM-PAC PT 6 Clicks Mobility  Outcome Measure Help needed turning from your back to your side while in a flat bed without using bedrails?: Total Help needed moving from lying on your back to sitting on the side of a flat bed without using  bedrails?: Total Help needed moving to and from a bed to a chair (including a wheelchair)?: A Lot Help needed standing up from a chair using your arms (e.g., wheelchair or bedside chair)?: A Lot Help needed to walk in hospital room?: Total Help needed climbing 3-5 steps with a railing? : Total 6 Click Score: 8    End of Session   Activity Tolerance: Patient tolerated treatment well;No increased pain Patient left: in bed;with call bell/phone within reach;with family/visitor present Nurse Communication: Mobility status PT Visit Diagnosis: Difficulty in walking, not elsewhere classified (R26.2);Other abnormalities of gait and mobility (R26.89);Muscle weakness (generalized) (M62.81);Repeated falls (R29.6);History of falling (Z91.81)    Time: 8540-8474 PT Time Calculation (min) (ACUTE ONLY): 26 min   Charges:   PT Evaluation $PT Eval Moderate Complexity: 1 Mod PT Treatments $Therapeutic Activity: 8-22 mins PT General Charges $$ ACUTE PT VISIT: 1 Visit  3:49 PM, 10/04/23 Peggye JAYSON Linear, PT, DPT Physical Therapist - [facility]  [phone] (ASCOM)     Athalene Kolle C 10/04/2023, 3:46 PM

## 2023-10-04 NOTE — Assessment & Plan Note (Signed)
Will continue antihypertensive therapy.

## 2023-10-04 NOTE — Assessment & Plan Note (Addendum)
 Will continue statin therapy

## 2023-10-04 NOTE — Evaluation (Signed)
 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  The patient had a fall on Thursday without head injury and slid out of bed today.  He could not get up from the ground per his wife.  He has been treated for UTI a couple weeks ago and had a reaction to Bactrim then was switched to doxycycline which she has been taking over the last couple of days.  Per ED note, pt accompanied by spouse who reports pt has been diagnosed with a UTI 2 weeks ago, pt started on new antibiotic and had reaction, per spouse pt now on second antibiotic, states pt is disoriented, weak, fell a couple of days ago on top on his daughter. Per spouse pt able to talk to you, when RN asked pt what's going on pt started to [facility] words, unable to understand.   MRI:  No acute intracranial abnormality.  CXR: no active disease(same as previous CXR a year ago).    Assessment / Plan / Recommendation  Clinical Impression   Pt seen for BSE today. Pt awake, verbal but w/ mumbled speech/Confusion; reduced engagement/affect. Pt has Baseline Parkinson's Dis and requires Full support/guidance w/ tasks currently. Wife and Daughter present in room. Wife stated pt takes his Sinemet  on a scheduled basis at home- this was relayed to NSG.  Pt w/ recent/current tx for UTI per chart.  Pt on RA, afebrile.   Pt appears to present w/ grossly functional oropharyngeal phase swallowing -- min oral phase dysphagia in setting of declined Cognitive functioning impacting his awareness during tasks. Pt has Baseline Parkinson's Disease- unsure of his Baseline Cognitive functioning. Family reported that he is able to feed himself using supportive utensils; Supervision w/ tasks in the home.  ANY Cognitive decline can impact overall awareness/timing of self-feeding/oral clearing and swallowing during po tasks which increases risk for aspiration, choking. Pt's risk for aspiration can be reduced when following general aspiration precautions and using a slightly modified diet consistency of cut, moistened foods easy to self-feed, and when given Supervision during po tasks. He required MOD verbal/visual/tactile cues for follow through during po tasks and self-feeding this session- Wife supported/aided him.        Pt consumed several trials of ice chips, purees, soft solids, and thin liquids via straw(monitored for small sips  slowly) w/ No overt, clinical s/s of aspiration noted: no decline in vocal quality, no cough, and no decline in respiratory status during/post trials. O2 sats remained in the upper 90s when checked. Oral phase was grossly adequate for bolus management and oral clearing of the boluses given. Mastication of softened solids was min slower d/t apparent decreased attention to the task.  Given Time and moistening the foods in smaller bites appeared to support the oral phase best. ENcouraged Wife/Family to check for oral clearing BETWEEN bites b/f another bite of food is given. Pt attempted self-feeding but required min-mod support and guidance d/t the Cognitive decline. Being able to self-feed self can improve safety of swallowing. OM Exam appeared [facility] w/ No unilateral weakness noted.          In setting of Baseline Parkinson's Dis/Cognitive decline, recommend continue w/ current diet but choosing foods easy to eat and cut/moistened for ease of oral phase/mastication; thin liquids w/ careful monitoring of sip size. Recommend general aspiration precautions; reduce Distractions during meals and engage pt during meals for self-feeding. Tray setup and support w/ feeding at meals as needed. Pills Whole vs Crushed in Puree as needed for safer swallowing.   MD/NSG updated. Pt/Family given education; agreed. No further acute ST services indicated; MD to reconsult if any changes during admit. Recommend follow w/ Palliative Care for GOC and education re: impact of Cognitive decline/Parkinson's Dis. Dietician support as needed. Suspect pt is close to/at his baseline. Precautions posted in room, chart. SLP Visit Diagnosis: Dysphagia, oral phase (R13.11) (impact from Parkinson's Disease and Cognitive decline; impulsivity and decreased awareness w/ tasks)    Aspiration Risk  Mild aspiration risk;Risk for inadequate nutrition/hydration (reduced when following general precautions and given support w/ feeding at meals- pt uses supportive utensils per Family)    Diet Recommendation   Thin;Dysphagia 3 (mechanical soft);Age appropriate regular (cut/chopped, finger foods; moistened foods easy to chew) = foods easy to eat and cut/moistened for ease of oral phase/mastication; thin liquids w/ careful monitoring of sip size. Recommend general aspiration precautions; reduce Distractions during meals and engage pt  during meals for self-feeding. Tray setup and support w/ feeding at meals as needed.   Medication Administration: Whole meds with puree (vs Crushed in Puree)    Other  Recommendations Recommended Consults:  (Dietician support) Oral Care Recommendations: Oral care BID;Oral care before and after PO;Staff/trained caregiver to provide oral care     Assistance Recommended at Discharge  FULL esp at meals  Functional Status Assessment Patient has not had a recent decline in their functional status (per Family)  Frequency and Duration  (n/a)   (n/a)       Prognosis Prognosis for improved oropharyngeal function: Fair (-Good) Barriers to Reach Goals: Cognitive deficits;Language deficits;Time post onset;Severity of deficits Barriers/Prognosis Comment: Parkinson's Dis.; Cognitive decline      Swallow Study   General Date of Onset: 10/03/23 HPI: Pt is a 78 y.o. 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  The patient had a fall on Thursday without head injury and slid out of bed today.  He could not get up from the ground per his wife.  He has been treated for UTI a couple weeks ago and had a reaction to Bactrim then was switched to doxycycline which she has been  taking over the last couple of days.  Per ED note, pt accompanied by spouse who reports pt has been diagnosed with a UTI 2 weeks ago, pt started on new antibiotic and had reaction, per spouse pt now on second antibiotic, states pt is disoriented, weak, fell a couple of days ago on top on his daughter. Per spouse pt able to talk to you, when RN asked pt what's going on pt started to [facility] words, unable to understand.   MRI: No acute intracranial abnormality.  CXR: no active disease(same as previous  CXR a year ago). Type of Study: Bedside Swallow Evaluation Previous Swallow Assessment: none Diet Prior to this Study: Regular;Thin liquids (Level 0) (per MD order) Temperature Spikes Noted: No (wbc elevated) Respiratory Status: Room air History of Recent Intubation: No Behavior/Cognition: Alert;Cooperative;Pleasant mood;Confused;Requires cueing;Distractible (muttered/mumbled speech; flat affect; impuulsive) Oral Cavity Assessment: Within Functional Limits Oral Care Completed by SLP: Recent completion by staff Oral Cavity - Dentition: Adequate natural dentition Vision: Functional for self-feeding Self-Feeding Abilities: Able to feed self;Needs assist;Needs set up;Total assist (impulsive; decreased awareness and ability to hold utensil securely) Patient Positioning: Upright in bed (supported) Baseline Vocal Quality: Low vocal intensity (muttered/mumbled) Volitional Cough: Cognitively unable to elicit Volitional Swallow: Unable to elicit    Oral/Motor/Sensory Function Overall Oral Motor/Sensory Function: Within functional limits (no unilateral weakness noted during sips/bites and oral clearing)   Ice Chips Ice chips: Not tested   Thin Liquid Thin Liquid: Within functional limits Presentation: Straw (Wife supported; ~10 trials)    Nectar Thick Nectar Thick Liquid: Not tested   Honey Thick Honey Thick Liquid: Not tested   Puree Puree: Within functional limits Presentation: Spoon (fed; 6 trials)   Solid     Solid: Impaired Presentation: Spoon;Self Fed (Wife fed after; 5-6 trials) Oral Phase Impairments: Poor awareness of bolus;Impaired mastication Oral Phase Functional Implications: Impaired mastication Pharyngeal Phase Impairments:  (none) Other Comments: attention to task        Comer Portugal, MS, CCC-SLP Speech Language Pathologist Rehab Services; [facility] [phone] (ascom) Jettie Lazare 10/04/2023,12:30 PM

## 2023-10-05 ENCOUNTER — Encounter: Payer: Self-pay | Admitting: Family Medicine

## 2023-10-05 DIAGNOSIS — G9340 Encephalopathy, unspecified: Secondary | ICD-10-CM | POA: Diagnosis not present

## 2023-10-05 LAB — COMPREHENSIVE METABOLIC PANEL WITH GFR
ALT: <5 U/L (ref 0–44)
AST: 17 U/L (ref 15–41)
Albumin: 3.5 g/dL (ref 3.5–5.0)
Alkaline Phosphatase: 129 U/L — ABNORMAL HIGH (ref 38–126)
Anion gap: 8 (ref 5–15)
BUN: 16 mg/dL (ref 8–23)
CO2: 25 mmol/L (ref 22–32)
Calcium: 9.2 mg/dL (ref 8.9–10.3)
Chloride: 104 mmol/L (ref 98–111)
Creatinine, Ser: 0.79 mg/dL (ref 0.61–1.24)
GFR, Estimated: >60 mL/min (ref >60)
Glucose, Bld: 105 mg/dL — ABNORMAL HIGH (ref 70–99)
Potassium: 3.9 mmol/L (ref 3.5–5.1)
Sodium: 137 mmol/L (ref 135–145)
Total Bilirubin: 0.8 mg/dL (ref 0.0–1.2)
Total Protein: 6.2 g/dL — ABNORMAL LOW (ref 6.5–8.1)

## 2023-10-05 LAB — URINE CULTURE: Culture: <10000 — AB

## 2023-10-05 LAB — CBC WITH DIFFERENTIAL/PLATELET
Abs Immature Granulocytes: 0.07 K/uL (ref 0.00–0.07)
Basophils Absolute: 0.1 K/uL (ref 0.0–0.1)
Basophils Relative: 1 %
Eosinophils Absolute: 0.3 K/uL (ref 0.0–0.5)
Eosinophils Relative: 2 %
HCT: 43 % (ref 39.0–52.0)
Hemoglobin: 14.6 g/dL (ref 13.0–17.0)
Immature Granulocytes: 1 %
Lymphocytes Relative: 24 %
Lymphs Abs: 3.5 K/uL (ref 0.7–4.0)
MCH: 31.7 pg (ref 26.0–34.0)
MCHC: 34 g/dL (ref 30.0–36.0)
MCV: 93.3 fL (ref 80.0–100.0)
Monocytes Absolute: 1.4 K/uL — ABNORMAL HIGH (ref 0.1–1.0)
Monocytes Relative: 10 %
Neutro Abs: 9 K/uL — ABNORMAL HIGH (ref 1.7–7.7)
Neutrophils Relative %: 62 %
Platelets: 331 K/uL (ref 150–400)
RBC: 4.61 MIL/uL (ref 4.22–5.81)
RDW: 12.9 % (ref 11.5–15.5)
WBC: 14.4 K/uL — ABNORMAL HIGH (ref 4.0–10.5)
nRBC: 0 % (ref 0.0–0.2)

## 2023-10-05 MED ORDER — VENLAFAXINE HCL 25 MG PO TABS: 25.0000 mg | ORAL_TABLET | Freq: Every day | ORAL | 0 refills | Status: AC

## 2023-10-05 MED ORDER — GLYCOPYRROLATE 1 MG PO TABS
0.5000 mg | ORAL_TABLET | Freq: Two times a day (BID) | ORAL | Status: DC
Start: 2023-10-05 — End: 2023-10-05
  Filled 2023-10-05: qty 1

## 2023-10-05 MED ORDER — ENSURE PLUS HIGH PROTEIN PO LIQD
237.0000 mL | Freq: Two times a day (BID) | ORAL | Status: DC
  Administered 2023-10-05 (×2): 237 mL via ORAL

## 2023-10-05 MED ORDER — VENLAFAXINE HCL 37.5 MG PO TABS
37.5000 mg | ORAL_TABLET | Freq: Two times a day (BID) | ORAL | Status: DC
  Filled 2023-10-05: qty 1

## 2023-10-05 MED ORDER — CARBIDOPA-LEVODOPA 25-100 MG PO TABS
2.0000 | ORAL_TABLET | Freq: Four times a day (QID) | ORAL | Status: DC
  Administered 2023-10-05 (×2): 2 via ORAL
  Filled 2023-10-05: qty 2

## 2023-10-05 NOTE — TOC CM/SW Note (Signed)
..  Transition of Care [facility]) - Inpatient Brief Assessment   Patient Details  Name: Chad Sandoval MRN: [id number] Date of Birth: [birth year]  Transition of Care [facility]) CM/SW Contact:    Chad [name], LCSW Phone Number: 10/05/2023, 5:16 PM   Clinical Narrative:  SW contacted wife regarding transportation.  Wife stated that she will need assistance with transport.  Supervisor Chad Sandoval is working on transportation.  Wife stated that [facility] agency they have been working with is [facility] [phone]. SW contacted [facility] and let them know that pt is to discharge today.  Wife expressed that pt needs to go to bathroom and has already requested bedside commode but staff has not returned. SW message Chad Essex, RN, regarding pt need to use commode.   Transition of Care Asessment:

## 2023-10-05 NOTE — Discharge Summary (Signed)
 DISCHARGE SUMMARY    Chad Sandoval FMW:[id number] DOB: 1946/02/21 DOA: 10/03/2023  PCP: Alla Amis, MD  Admit date: 10/03/2023 Discharge date: 10/05/2023   Recommendations for Outpatient Follow-up:  Follow up with PCP in 1-2 weeks to review chronic medication management and make further medication changes to parkinson's medication  Hospital Course: Chad Sandoval is a 78 year old male with hypertension, Parkinson's disease, splenic marginal zone lymphoma status postsplenectomy, who presents to the ED with altered mentation.  Patient had a fall on Thursday without head injury and reportedly slid out of the bed on day prior to arrival.  He could not get up off of the ground.  Patient was recently treated for UTI 1 week ago and had reaction to Bactrim.  He was then switched to doxycycline which he has been taking as prescribed.  In the ED labs and vitals were unremarkable.  CBC showed leukocytosis of 16.4, UA with RBCs and rare bacteria.  Urine culture was sent, blood cultures were sent.  MRI was performed and revealed no acute intracranial abnormalities but did reveal mild age-related cerebral atrophy with chronic small vessel ischemic disease.  It also revealed loss of normal flow within the mid distal right V4 segment that could reflect slow flow or occlusion.  Patient was admitted for observation.  CTA performed which confirms slow flow, no evidence of occlusion or stenosis.  Otherwise, workup was mostly unremarkable. Patient had intermittent periods of wakefulness.  He worked well with physical therapy and Occupational Therapy.  He had extensive discussion with his family, wife and daughter, on day of discharge.  They agreed to discharging home with home health and palliative care services.  We did discuss hospice as an option but they are not yet ready for this.  They may consider transition to hospice at a later time.  Acute encephalopathy - Appears to be delirium perhaps provoked by  recent medication changes, UTI, and insomnia.  Patient has not slept for many days prior to arrival - Did have recent UTI and has been on appropriate antibiotics. - On arrival was tachycardic, tachypnea, leukocytosis. - UA with RBCs and rare bacteria, was on ceftriaxone but urine cultures without significant growth.  Will discontinue - Blood cultures remain negative - MRI brain without acute abnormalities but does show slow flow and possible occlusion.  Have discussed this with neurology.  CTA performed which confirms slow flow, no evidence of stenosis or occlusion - Continue PT/OT at discharge with home health - Encourage sleep-wake cycle and delirium precautions. -Family has concerns about medications and would like to begin tapering off of venlafaxine.  Have made these changes at discharge   Chronic leukocytosis Status post splenectomy Lymphoma - Patient's daughter reports patient has chronic leukocytosis secondary to splenectomy and lymphoma - CBC stable.  Afebrile.  Patient follow-up.   Hypertension - Continue antihypertensive therapy, titrate as needed   Type 2 diabetes with peripheral neuropathy - Continue home meds for now.   Dyslipidemia - Continue statin   Parkinson's disease - Continue with Sinemet.  Discharge Instructions  Discharge Instructions     Amb Referral to Palliative Care   Complete by: As directed    Call MD for:  difficulty breathing, headache or visual disturbances   Complete by: As directed    Call MD for:  persistant dizziness or light-headedness   Complete by: As directed    Call MD for:  persistant nausea and vomiting   Complete by: As directed    Call MD for:  severe uncontrolled pain   Complete by: As directed    Call MD for:  temperature >100.4   Complete by: As directed    Diet general   Complete by: As directed    Discharge instructions   Complete by: As directed    As we discussed, we are decreasing the dose of venlafaxine per your  request.  I have sent in new 25 mg tabs.  You reported currently taking 25 mg of Seroquel.  It appears that Madeleine is prescribed 50 mg tablets.  Proceed with 50 mg Seroquel at bedtime.  Can consider increasing to 75 mg when following up with your primary care physician.    Follow up with your primary care physician to discuss the medication changes during this admission   Increase activity slowly   Complete by: As directed       Allergies as of 10/05/2023       Reactions   Niacin Other (See Comments)   PER PT-MADE HIM FEEL REALLY HOT ALL OVER BODY   Oxycontin [oxycodone Hcl] Other (See Comments)   caused him to be really hot        Medication List     STOP taking these medications    furosemide 20 MG tablet Commonly known as: Lasix   gabapentin 300 MG capsule Commonly known as: NEURONTIN   ketoconazole 2 % shampoo Commonly known as: NIZORAL   lisinopril 10 MG tablet Commonly known as: ZESTRIL   Multi-Vitamins Tabs   potassium chloride 10 MEQ tablet Commonly known as: KLOR-CON M   sildenafil 100 MG tablet Commonly known as: VIAGRA   sildenafil 20 MG tablet Commonly known as: REVATIO   traMADol 50 MG tablet Commonly known as: ULTRAM   Vitamin D3 50 MCG (2000 UT) Tabs       TAKE these medications    Amantadine HCl 100 MG tablet Take 100 mg by mouth 3 (three) times daily. Admin times: 1000, 1400 & 1800   carbidopa-levodopa 25-100 MG tablet Commonly known as: SINEMET IR Take 2 tablets by mouth 4 (four) times daily. Admin times: 0700, 1000, 1400, & 1800   carbidopa-levodopa 50-200 MG tablet Commonly known as: SINEMET CR Take 1 tablet by mouth at bedtime.   Elmiron 100 MG capsule Generic drug: pentosan polysulfate Take 100 mg by mouth 3 (three) times daily. Admin times: 1000, 1600 & 2200   glycopyrrolate 1 MG tablet Commonly known as: ROBINUL Take 1 mg by mouth daily.   lovastatin 20 MG tablet Commonly known as: MEVACOR TAKE 1 TABLET BY MOUTH  EVERY DAY AT NIGHT   metFORMIN 500 MG 24 hr tablet Commonly known as: GLUCOPHAGE-XR Take 1,000 mg by mouth every morning.   QUEtiapine 50 MG tablet Commonly known as: SEROQUEL Take 50 mg by mouth at bedtime.   venlafaxine 25 MG tablet Commonly known as: EFFEXOR Take 1 tablet (25 mg total) by mouth daily. What changed:  medication strength how much to take when to take this        Follow-up Information     Alla Amis, MD Follow up.   Specialty: Family Medicine Why: hospital follow up Contact information: [address] [place] [address] [phone]                Allergies  Allergen Reactions   Niacin Other (See Comments)    PER PT-MADE HIM FEEL REALLY HOT ALL OVER BODY   Oxycontin [Oxycodone Hcl] Other (See Comments)    caused him to  be really hot    Consultations:    Procedures/Studies: CT ANGIO HEAD NECK W WO CM Result Date: 10/05/2023 CLINICAL DATA:  Initial evaluation for loss of intracranial vascular flow voids. EXAM: CT ANGIOGRAPHY HEAD AND NECK WITH AND WITHOUT CONTRAST TECHNIQUE: Multidetector CT imaging of the head and neck was performed using the standard protocol during bolus administration of intravenous contrast. Multiplanar CT image reconstructions and MIPs were obtained to evaluate the vascular anatomy. Carotid stenosis measurements (when applicable) are obtained utilizing NASCET criteria, using the distal internal carotid diameter as the denominator. RADIATION DOSE REDUCTION: This exam was performed according to the departmental dose-optimization program which includes automated exposure control, adjustment of the mA and/or kV according to patient size and/or use of iterative reconstruction technique. CONTRAST:  75mL OMNIPAQUE  IOHEXOL  350 MG/ML SOLN COMPARISON:  CT and MRI from 10/03/2023 FINDINGS: CT HEAD FINDINGS Brain: Mild age-related cerebral atrophy with chronic small vessel ischemic disease. No acute intracranial  hemorrhage. No acute large vessel territory infarct. No mass lesion or midline shift. No hydrocephalus or extra-axial fluid collection. Vascular: No abnormal hyperdense vessel. Calcified atherosclerosis present at the skull base. Skull: Scalp soft tissues demonstrate no acute finding. Calvarium intact. Sinuses/Orbits: Globes orbital soft tissues within normal limits. Paranasal sinuses and mastoid air cells are largely clear. Other: None. Review of the MIP images confirms the above findings CTA NECK FINDINGS Aortic arch: Aortic arch within normal limits for caliber with standard branch pattern. Aortic atherosclerosis. No significant stenosis about the origin the great vessels. Right carotid system: Right common and internal carotid arteries are patent without dissection. Calcified plaque about the right carotid bulb without hemodynamically significant greater than 50% stenosis. Left carotid system: Left common and internal carotid arteries are patent without dissection. Calcified plaque about the left carotid bulb without hemodynamically significant greater than 50% stenosis. Vertebral arteries: Both vertebral arteries arise from subclavian arteries. Left vertebral artery dominant. Tortuosity with atheromatous change about the proximal left V1 segment without hemodynamically significant stenosis. Vertebral arteries patent distally without stenosis or dissection. Skeleton: No worrisome osseous lesions. Mild for age cervical spondylosis. Mild chronic height loss at the superior endplates of T5 and T6. Other neck: No other acute finding. Multiple punctate sialoliths noted within the parotid glands bilaterally. Right submandibular gland appears to be absent. Upper chest: No other acute finding. Review of the MIP images confirms the above findings CTA HEAD FINDINGS Anterior circulation: Scattered atheromatous change about the carotid siphons without hemodynamically significant stenosis. A1 segments patent bilaterally.  Normal anterior communicating artery complex. Anterior cerebral arteries patent without stenosis. No M1 stenosis or occlusion. Distal MCA branches perfused and symmetric. Posterior circulation: Atheromatous change within the dominant mid left V4 segment with associated short-segment moderate stenosis (series 9, image 268). Right vertebral artery markedly diminutive and attenuated but remains patent to the vertebrobasilar junction. Previously noted signal abnormality with most likely related to slow flow. Both PICA patent, with extradural origins noted bilaterally. Basilar patent without stenosis. Superior cerebellar and posterior cerebral arteries patent bilaterally. Venous sinuses: Grossly patent allowing for timing the contrast bolus. Anatomic variants: As above.  No aneurysm. Review of the MIP images confirms the above findings IMPRESSION: CT HEAD: 1. No acute intracranial abnormality. 2. Mild age-related cerebral atrophy with chronic small vessel ischemic disease. CTA HEAD AND NECK: 1. Negative CTA for large vessel occlusion or other emergent finding. 2. Atheromatous change about the dominant left V4 segment with associated short-segment moderate stenosis. Right vertebral artery markedly diminutive with attenuated flow, but remains  patent to the vertebrobasilar junction. Therefore, previously noted signal abnormality was consistent with slow flow. 3. Atheromatous change about the carotid bifurcations and carotid siphons without hemodynamically significant stenosis. 4.  Aortic Atherosclerosis (ICD10-I70.0). Electronically Signed   By: Morene Hoard M.D.   On: 10/05/2023 00:26   MR BRAIN WO CONTRAST Result Date: 10/03/2023 CLINICAL DATA:  Initial evaluation for acute dysarthria. EXAM: MRI HEAD WITHOUT CONTRAST TECHNIQUE: Multiplanar, multiecho pulse sequences of the brain and surrounding structures were obtained without intravenous contrast. COMPARISON:  CT from earlier the same day. FINDINGS: Brain: Mild  age-related cerebral atrophy. Patchy T2/FLAIR hyperintensity involving the periventricular and deep white matter both cerebral hemispheres, most likely related chronic microvascular ischemic disease, mild for age. No abnormal foci of restricted diffusion to suggest acute or subacute ischemia. Gray-white matter differentiation maintained. No areas of chronic cortical infarction. No acute or chronic intracranial blood products. No mass lesion, midline shift or mass effect. No hydrocephalus or extra-axial fluid collection. Pituitary gland within normal limits. Vascular: Loss of normal flow void within the mid-distal right V4 segment, which could reflect slow flow and/or occlusion. Left vertebral artery mildly tortuous and partially invaginate upon the medulla. Major intracranial vascular flow voids are otherwise maintained. Skull and upper cervical spine: Craniocervical junction within normal limits. Bone marrow signal intensity overall within normal limits. No scalp soft tissue abnormality. Sinuses/Orbits: Prior bilateral ocular lens replacement. Paranasal sinuses are largely clear. No significant mastoid effusion. Other: None. IMPRESSION: 1. No acute intracranial abnormality. 2. Mild age-related cerebral atrophy with chronic small vessel ischemic disease. 3. Loss of normal flow void within the mid-distal right V4 segment, which could reflect slow flow and/or occlusion. Electronically Signed   By: Morene Hoard M.D.   On: 10/03/2023 21:54   CT HEAD WO CONTRAST ( ) Result Date: 10/03/2023 CLINICAL DATA:  nonfocal altered EXAM: CT HEAD WITHOUT CONTRAST TECHNIQUE: Contiguous axial images were obtained from the base of the skull through the vertex without intravenous contrast. RADIATION DOSE REDUCTION: This exam was performed according to the departmental dose-optimization program which includes automated exposure control, adjustment of the mA and/or kV according to patient size and/or use of iterative  reconstruction technique. COMPARISON:  June 23, 2019 FINDINGS: Brain: Proportional prominence of the ventricles and sulci, consistent with diffuse cerebral parenchymal volume loss. The ventricles otherwise maintained midline position without midline shift. Gray-white differentiation is preserved.Scattered periventricular white matter hypoattenuation, most consistent with changes of mild chronic ischemic microvascular disease.No evidence of acute territorial infarction, extra-axial fluid collection, hemorrhage, or mass lesion. The basilar cisterns are patent without downward herniation. The cerebellar hemispheres and vermis are well formed without mass lesion or focal attenuation abnormality. Vascular: No hyperdense vessel. Calcified atherosclerotic plaque within the cavernous/supraclinoid ICA and intradural vertebral arteries. Skull: Normal. Negative for fracture or focal lesion. Sinuses/Orbits: The paranasal sinuses and mastoids are clear.The globes appear intact. No retrobulbar hematoma. Other: None. IMPRESSION: No acute intracranial abnormality, specifically, no acute hemorrhage, territorial infarction, or intracranial mass. Electronically Signed   By: Rogelia Myers M.D.   On: 10/03/2023 16:47   DG Chest Portable 1 View Result Date: 10/03/2023 CLINICAL DATA:  Sepsis. EXAM: PORTABLE CHEST 1 VIEW COMPARISON:  09/13/2022 FINDINGS: The heart size and mediastinal contours are within normal limits. Both lungs are clear. The visualized skeletal structures are unremarkable. IMPRESSION: No active disease. Electronically Signed   By: Elspeth Bathe M.D.   On: 10/03/2023 15:48   DG Hip Unilat W or Wo Pelvis 2-3 Views Right Result Date: 10/03/2023 CLINICAL  DATA:  Right hip pain EXAM: DG HIP (WITH OR WITHOUT PELVIS) 2-3V RIGHT COMPARISON:  None Available. FINDINGS: Osteopenia.No evidence of pelvic fracture or diastasis.No acute hip fracture or dislocation.There is no evidence of arthropathy or other focal bone  abnormality.Soft tissues are unremarkable. IMPRESSION: No acute fracture, pelvic bone diastasis, or dislocation. Electronically Signed   By: Rogelia Myers M.D.   On: 10/03/2023 13:58      Discharge Exam: Vitals:   10/05/23 0402 10/05/23 0835  BP: 99/74 114/84  Pulse: 91 87  Resp: 16 18  Temp: 98 F (36.7 C) 98.1 F (36.7 C)  SpO2: 94% 98%   Vitals:   10/04/23 1544 10/04/23 2003 10/05/23 0402 10/05/23 0835  BP: (!) 101/59 (!) 150/93 99/74 114/84  Pulse: 98 99 91 87  Resp: 20 18 16 18   Temp: 98.6 F (37 C) 97.8 F (36.6 C) 98 F (36.7 C) 98.1 F (36.7 C)  TempSrc:  Oral  Oral  SpO2: 93% 93% 94% 98%  Weight:      Height:        Constitutional:  Normal appearance. Non toxic-appearing.  HENT: Head Normocephalic and atraumatic.  Mucous membranes are moist.  Eyes:  Extraocular intact. Conjunctivae normal.  Cardiovascular: Rate and Rhythm: Normal rate and regular rhythm.  Pulmonary: Non labored, symmetric rise of chest wall.  Skin: warm and dry. not jaundiced.  Neurological: Drowsy but arousable.  Opens eyes and follows some commands.  Requires significant prompting.  Intermittent tremor and jerking of upper extremities  The results of significant diagnostics from this hospitalization (including imaging, microbiology, ancillary and laboratory) are listed below for reference.     Microbiology: Recent Results (from the past 240 hours)  Blood culture (routine x 2)     Status: None (Preliminary result)   Collection Time: 10/03/23  3:38 PM   Specimen: BLOOD  Result Value Ref Range Status   Specimen Description BLOOD RIGHT ANTECUBITAL  Final   Special Requests   Final    BOTTLES DRAWN AEROBIC AND ANAEROBIC Blood Culture adequate volume   Culture   Final    NO GROWTH 2 DAYS Performed at [facility], [address] [place], [place] [address]    Report Status PENDING  Incomplete  Blood culture (routine x 2)     Status: None (Preliminary result)   Collection  Time: 10/03/23  3:39 PM   Specimen: BLOOD LEFT HAND  Result Value Ref Range Status   Specimen Description BLOOD LEFT HAND  Final   Special Requests   Final    BOTTLES DRAWN AEROBIC AND ANAEROBIC Blood Culture results may not be optimal due to an inadequate volume of blood received in culture bottles   Culture   Final    NO GROWTH 2 DAYS Performed at [facility], [address], [place], [place] [address]    Report Status PENDING  Incomplete  Urine Culture     Status: Abnormal   Collection Time: 10/03/23  7:30 PM   Specimen: Urine, Clean Catch  Result Value Ref Range Status   Specimen Description   Final    URINE, CLEAN CATCH Performed at [facility], [address] [place], [place] [address]    Special Requests   Final    NONE Performed at [facility], [address] [place], [place] [address]    Culture (A)  Final    <10,000 COLONIES/mL INSIGNIFICANT GROWTH Performed at [facility], [address] [place], [place] [address]  Report Status 10/05/2023 FINAL  Final     Labs: BNP (last 3 results) No results for input(s): BNP in the last 8760 hours. Basic Metabolic Panel: Recent Labs  Lab 10/03/23 1247 10/04/23 0421 10/05/23 0813  NA 135 140 137  K 4.3 3.9 3.9  CL 104 107 104  CO2 25 27 25   GLUCOSE 122* 101* 105*  BUN 21 15 16   CREATININE 0.98 0.86 0.79  CALCIUM 9.7 9.2 9.2   Liver Function Tests: Recent Labs  Lab 10/03/23 1247 10/05/23 0813  AST 21 17  ALT <5 <5  ALKPHOS 164* 129*  BILITOT 0.9 0.8  PROT 7.1 6.2*  ALBUMIN 4.2 3.5   No results for input(s): LIPASE, AMYLASE in the last 168 hours. No results for input(s): AMMONIA in the last 168 hours. CBC: Recent Labs  Lab 10/03/23 1247 10/04/23 0421 10/05/23 0813  WBC 16.4* 17.1* 14.4*  NEUTROABS  --   --  9.0*  HGB 16.0 14.7 14.6  HCT 47.6 44.0 43.0  MCV 95.2 94.8 93.3  PLT 321 302 331   Cardiac Enzymes: No results for input(s): CKTOTAL,  CKMB, CKMBINDEX, TROPONINI in the last 168 hours. BNP: Invalid input(s): POCBNP CBG: No results for input(s): GLUCAP in the last 168 hours. D-Dimer No results for input(s): DDIMER in the last 72 hours. Hgb A1c No results for input(s): HGBA1C in the last 72 hours. Lipid Profile No results for input(s): CHOL, HDL, LDLCALC, TRIG, CHOLHDL, LDLDIRECT in the last 72 hours. Thyroid function studies No results for input(s): TSH, T4TOTAL, T3FREE, THYROIDAB in the last 72 hours.  Invalid input(s): FREET3 Anemia work up No results for input(s): VITAMINB12, FOLATE, FERRITIN, TIBC, IRON, RETICCTPCT in the last 72 hours. Urinalysis    Component Value Date/Time   COLORURINE STRAW (A) 10/03/2023 1930   APPEARANCEUR CLEAR (A) 10/03/2023 1930   APPEARANCEUR Clear 06/28/2012 0921   LABSPEC 1.008 10/03/2023 1930   LABSPEC 1.015 06/28/2012 0921   PHURINE 6.0 10/03/2023 1930   GLUCOSEU NEGATIVE 10/03/2023 1930   GLUCOSEU Negative 06/28/2012 0921   HGBUR LARGE (A) 10/03/2023 1930   BILIRUBINUR NEGATIVE 10/03/2023 1930   BILIRUBINUR Negative 06/28/2012 0921   KETONESUR NEGATIVE 10/03/2023 1930   PROTEINUR NEGATIVE 10/03/2023 1930   NITRITE NEGATIVE 10/03/2023 1930   LEUKOCYTESUR NEGATIVE 10/03/2023 1930   LEUKOCYTESUR Negative 06/28/2012 0921   Sepsis Labs Recent Labs  Lab 10/03/23 1247 10/04/23 0421 10/05/23 0813  WBC 16.4* 17.1* 14.4*   Microbiology Recent Results (from the past 240 hours)  Blood culture (routine x 2)     Status: None (Preliminary result)   Collection Time: 10/03/23  3:38 PM   Specimen: BLOOD  Result Value Ref Range Status   Specimen Description BLOOD RIGHT ANTECUBITAL  Final   Special Requests   Final    BOTTLES DRAWN AEROBIC AND ANAEROBIC Blood Culture adequate volume   Culture   Final    NO GROWTH 2 DAYS Performed at [facility], [address], [place], [place] [address]    Report Status PENDING   Incomplete  Blood culture (routine x 2)     Status: None (Preliminary result)   Collection Time: 10/03/23  3:39 PM   Specimen: BLOOD LEFT HAND  Result Value Ref Range Status   Specimen Description BLOOD LEFT HAND  Final   Special Requests   Final    BOTTLES DRAWN AEROBIC AND ANAEROBIC Blood Culture results may not be optimal due to an inadequate volume of blood received in culture bottles  Culture   Final    NO GROWTH 2 DAYS Performed at [facility], [address] [place], [place] [address]    Report Status PENDING  Incomplete  Urine Culture     Status: Abnormal   Collection Time: 10/03/23  7:30 PM   Specimen: Urine, Clean Catch  Result Value Ref Range Status   Specimen Description   Final    URINE, CLEAN CATCH Performed at [facility], [address], [place], [place] [address]    Special Requests   Final    NONE Performed at [facility], [address] [place], [place] [address]    Culture (A)  Final    <10,000 COLONIES/mL INSIGNIFICANT GROWTH Performed at [facility], [address] [place], [place] [address]    Report Status 10/05/2023 FINAL  Final     Time coordinating discharge: 32 min  SIGNED: Kanika Bungert, DO [facility] 10/05/2023, 1:28 PM Pager   If 7PM-7AM, please contact night-coverage

## 2023-10-08 LAB — CULTURE, BLOOD (ROUTINE X 2)
Culture: NO GROWTH
Culture: NO GROWTH
Special Requests: ADEQUATE

## 2023-10-24 ENCOUNTER — Emergency Department
Admission: EM | Admit: 2023-10-24 | Discharge: 2023-10-24 | Disposition: A | Discharge status: Home / Self Care | Attending: Emergency Medicine | Admitting: Emergency Medicine

## 2023-10-24 ENCOUNTER — Emergency Department

## 2023-10-24 ENCOUNTER — Other Ambulatory Visit: Payer: Self-pay

## 2023-10-24 DIAGNOSIS — E119 Type 2 diabetes mellitus without complications: Secondary | ICD-10-CM | POA: Diagnosis not present

## 2023-10-24 DIAGNOSIS — S51011A Laceration without foreign body of right elbow, initial encounter: Secondary | ICD-10-CM | POA: Diagnosis not present

## 2023-10-24 DIAGNOSIS — T148XXA Other injury of unspecified body region, initial encounter: Secondary | ICD-10-CM

## 2023-10-24 DIAGNOSIS — I10 Essential (primary) hypertension: Secondary | ICD-10-CM | POA: Diagnosis not present

## 2023-10-24 DIAGNOSIS — S61411A Laceration without foreign body of right hand, initial encounter: Secondary | ICD-10-CM | POA: Insufficient documentation

## 2023-10-24 DIAGNOSIS — M7989 Other specified soft tissue disorders: Secondary | ICD-10-CM | POA: Insufficient documentation

## 2023-10-24 DIAGNOSIS — G20A1 Parkinson's disease without dyskinesia, without mention of fluctuations: Secondary | ICD-10-CM | POA: Diagnosis not present

## 2023-10-24 DIAGNOSIS — Y9301 Activity, walking, marching and hiking: Secondary | ICD-10-CM | POA: Diagnosis not present

## 2023-10-24 DIAGNOSIS — Y92009 Unspecified place in unspecified non-institutional (private) residence as the place of occurrence of the external cause: Secondary | ICD-10-CM | POA: Insufficient documentation

## 2023-10-24 DIAGNOSIS — W01198A Fall on same level from slipping, tripping and stumbling with subsequent striking against other object, initial encounter: Secondary | ICD-10-CM | POA: Insufficient documentation

## 2023-10-24 DIAGNOSIS — S61210A Laceration without foreign body of right index finger without damage to nail, initial encounter: Secondary | ICD-10-CM | POA: Insufficient documentation

## 2023-10-24 DIAGNOSIS — S0003XA Contusion of scalp, initial encounter: Secondary | ICD-10-CM

## 2023-10-24 DIAGNOSIS — Z23 Encounter for immunization: Secondary | ICD-10-CM | POA: Insufficient documentation

## 2023-10-24 DIAGNOSIS — W19XXXA Unspecified fall, initial encounter: Secondary | ICD-10-CM

## 2023-10-24 DIAGNOSIS — S42031A Displaced fracture of lateral end of right clavicle, initial encounter for closed fracture: Secondary | ICD-10-CM | POA: Diagnosis not present

## 2023-10-24 DIAGNOSIS — S0081XA Abrasion of other part of head, initial encounter: Secondary | ICD-10-CM

## 2023-10-24 DIAGNOSIS — S0101XA Laceration without foreign body of scalp, initial encounter: Secondary | ICD-10-CM | POA: Diagnosis not present

## 2023-10-24 DIAGNOSIS — Z8546 Personal history of malignant neoplasm of prostate: Secondary | ICD-10-CM | POA: Insufficient documentation

## 2023-10-24 DIAGNOSIS — S4991XA Unspecified injury of right shoulder and upper arm, initial encounter: Secondary | ICD-10-CM | POA: Diagnosis present

## 2023-10-24 LAB — URINALYSIS, ROUTINE W REFLEX MICROSCOPIC
Bacteria, UA: NONE SEEN
Bilirubin Urine: NEGATIVE
Glucose, UA: NEGATIVE mg/dL
Ketones, ur: 5 mg/dL — AB
Leukocytes,Ua: NEGATIVE
Nitrite: NEGATIVE
Protein, ur: NEGATIVE mg/dL
Specific Gravity, Urine: 1.018 (ref 1.005–1.030)
Squamous Epithelial / HPF: 0 /HPF (ref 0–5)
pH: 6 (ref 5.0–8.0)

## 2023-10-24 MED ORDER — TETANUS-DIPHTH-ACELL PERTUSSIS 5-2.5-18.5 LF-MCG/0.5 IM SUSY
0.5000 mL | PREFILLED_SYRINGE | Freq: Once | INTRAMUSCULAR | Status: AC
  Administered 2023-10-24: 0.5 mL via INTRAMUSCULAR
  Filled 2023-10-24: qty 0.5

## 2023-10-24 MED ORDER — LIDOCAINE-EPINEPHRINE-TETRACAINE (LET) TOPICAL GEL
6.0000 mL | Freq: Once | TOPICAL | Status: AC
  Administered 2023-10-24: 6 mL via TOPICAL
  Filled 2023-10-24: qty 6

## 2023-10-24 NOTE — ED Triage Notes (Signed)
 Per EMS report, patient was walking at home on the deck without his walker and ankle rolled. Patient hit against a table, no LOC and no blood thinners. Patient has a history of Parkinson's. Per report, patient has an hematoma and laceration to right forehead which is covered and has blood on the gauze. Patient has a laceration to right hand and skin tear to right elbow and is c/o pain from right shoulder to hand. Patient is alert and oriented to x4.  202/98 1st 180/90 2nd 94 pulse , sinus rhythm 95% room air  Cbg 168

## 2023-10-24 NOTE — ED Provider Notes (Signed)
 [facility] Provider Note    Event Date/Time   First MD Initiated Contact with Patient 10/24/23 1433     (approximate)   History   Fall   HPI  Chad Sandoval is a 78 y.o. male  with a past medical history of Type 2 diabetes, Parkinson's disease, HLD, HTN, Malignant neoplasm of prostate presents to the emergency department following a fall that occurred today.  Patient states he was walking at home on his deck without his walker and was trying to adjust his feet when his right foot turned but his left foot got stuck and ended up falling onto his right shoulder, elbow, hand and head.  Patient reports pain in his right shoulder going down into his hand, described as tingly and numb. Patient denies headache, vision changes, vomiting, dizziness, chest pain, shortness of breath, nausea. Denies LOC. Does not take blood thinners. Patient is acting appropriately and at his baseline per family.  Denies urinary symptoms or back pain, but patient's family is requesting a UA at this time. Unsure of last tetanus injection.    Physical Exam   Triage Vital Signs: ED Triage Vitals  Encounter Vitals Group     BP 10/24/23 1358 (!) 160/96     Girls Systolic BP Percentile --      Girls Diastolic BP Percentile --      Boys Systolic BP Percentile --      Boys Diastolic BP Percentile --      Pulse Rate 10/24/23 1358 97     Resp 10/24/23 1358 18     Temp 10/24/23 1358 98.7 F (37.1 C)     Temp src --      SpO2 10/24/23 1358 95 %     Weight 10/24/23 1406 190 lb (86.2 kg)     Height 10/24/23 1406 5' 9 (1.753 m)     Head Circumference --      Peak Flow --      Pain Score 10/24/23 1405 5     Pain Loc --      Pain Education --      Exclude from Growth Chart --     Most recent vital signs: Vitals:   10/24/23 1422 10/24/23 1720  BP:  (!) 151/87  Pulse:  (!) 108  Resp:  18  Temp:    SpO2: 100% 96%    General: Awake, in no acute distress. Appears stated age. Head:  Normocephalic, atraumatic. Eyes: PERRLA. EOMs intact. No scleral icterus or conjunctival injection. Neck: Supple, no lymphadenopathy, no nuchal rigidity. CV: Good peripheral perfusion. No edema.  Respiratory:Normal respiratory effort.  No respiratory distress. CTAB. GI: Soft, non-distended, non-tender.  MSK: Normal ROM and 5/5 strength in b/l upper and lower extremities. TTP along distal right clavicle into the acromion. Skin:Warm, dry.  4 cm laceration to mid-forehead with 2 abrasions on left forehead. 3 cm skin tear to right elbow, scattered skin tears along the right dorsal hand, one 0.5 cm skin tear to dorsal right pointer finger between DIP and MCP. Neurological: A&Ox4 to person, place, time, and situation. Cranial nerves III-XII grossly intact. Sensation intact and equal to b/l extremities. Strength symmetric. No focal deficits.  Psychiatric: Mood and affect appropriate. Thought processes coherent.   ED Results / Procedures / Treatments   Labs (all labs ordered are listed, but only abnormal results are displayed) Labs Reviewed  URINALYSIS, ROUTINE W REFLEX MICROSCOPIC - Abnormal; Notable for the following components:  Result Value   Color, Urine YELLOW (*)    APPearance CLEAR (*)    Hgb urine dipstick SMALL (*)    Ketones, ur 5 (*)    All other components within normal limits     EKG     RADIOLOGY X-rays left and right hand, right shoulder, CT head and cervical spine ordered.  CT head and cervical spine IMPRESSION: 1. No evidence of acute intracranial abnormality. 2. No evidence of acute fracture or traumatic malalignment in the cervical spine. 3. Right forehead contusion without fracture.  X ray right shoulder IMPRESSION: 1. Minimally displaced fracture of the distal right clavicle.  PROCEDURES:  Critical Care performed: No   .Laceration Repair  Date/Time: 10/24/2023 5:27 PM  Performed by: Sheron Salm, PA-C Authorized by: Sheron Salm, PA-C    Consent:    Consent obtained:  Verbal   Consent given by:  Patient   Risks, benefits, and alternatives were discussed: yes     Risks discussed:  Infection, need for additional repair, nerve damage, poor wound healing, poor cosmetic result, pain, retained foreign body, tendon damage and vascular damage Universal protocol:    Procedure explained and questions answered to patient or proxy's satisfaction: yes     Immediately prior to procedure, a time out was called: yes     Patient identity confirmed:  Verbally with patient Anesthesia:    Anesthesia method:  Topical application   Topical anesthetic:  LET Laceration details:    Location:  Scalp   Scalp location:  Frontal   Length (cm):  4 Pre-procedure details:    Preparation:  Patient was prepped and draped in usual sterile fashion Exploration:    Hemostasis achieved with:  LET   Imaging obtained comment:  CT   Wound exploration: entire depth of wound visualized     Wound extent: no foreign body, no signs of injury, no nerve damage, no tendon damage, no underlying fracture and no vascular damage     Contaminated: no   Treatment:    Area cleansed with:  Chlorhexidine   Amount of cleaning:  Standard   Irrigation solution:  Sterile saline   Irrigation volume:  40 mL   Irrigation method:  Syringe Skin repair:    Repair method:  Tissue adhesive (dermabond) Approximation:    Approximation:  Close Repair type:    Repair type:  Simple Post-procedure details:    Dressing:  Sterile dressing and non-adherent dressing   Procedure completion:  Tolerated well, no immediate complications Wound repair  Date/Time: 10/24/2023 5:29 PM  Performed by: Sheron Jajaira Ruis, PA-C Authorized by: Sheron Salm, PA-C  Consent: Verbal consent obtained Risks and benefits: risks, benefits and alternatives were discussed Consent given by: patient Patient understanding: patient states understanding of the procedure being performed Imaging studies: imaging  studies available Patient identity confirmed: verbally with patient Time out: Immediately prior to procedure a time out was called to verify the correct patient, procedure, equipment, support staff and site/side marked as required. Preparation: Patient was prepped and draped in the usual sterile fashion. Local anesthesia used: yes  Anesthesia: Local anesthesia used: yes Local Anesthetic: LET (lido, epi, tetracaine ) Anesthetic total: 3 mL  Sedation: Patient sedated: no  Patient tolerance: patient tolerated the procedure well with no immediate complications Comments: Areas cleansed with chlorhexidine and flushed with sterile saline 30 mL on all wounds prior to administration of steri strips. 7 steri strips applied to multiple skin tears of dorsal right hand. 1 steri strip applied to skin tear of  dorsal right pointer finger between DIP and MCP. 2 steri strips applied to right lateral elbow over 1 skin tear and dermabond used, nonadhesive applied. All wounds then dressed with sterile dressings.      MEDICATIONS ORDERED IN ED: Medications  lidocaine -EPINEPHrine -tetracaine  (LET) topical gel (6 mLs Topical Given 10/24/23 1549)  Tdap (BOOSTRIX) injection 0.5 mL (0.5 mLs Intramuscular Given 10/24/23 1701)     IMPRESSION / MDM / ASSESSMENT AND PLAN / ED COURSE  I reviewed the triage vital signs and the nursing notes.                              Differential diagnosis includes, but is not limited to, mechanical fall, clavicle fracture, forehead laceration, head abrasions, multiple skin tears, brachial plexus injury  Patient's presentation is most consistent with acute complicated illness / injury requiring diagnostic workup.  Patient is a 78 year old male who presented today after mechanical fall with forehead laceration, forehead abrasions, multiple skin tears to the right elbow, right dorsal hand, and right pointer finger.  CT of head and cervical spine without any acute intracranial  abnormalities, fracture or malalignment in the cervical spine; right forehead contusion is present.  X-rays of the right and left hand without any acute findings.  X-ray of the right shoulder shows distal clavicle fracture, minimally displaced. I independently viewed the x-rays and radiologists' reports.  I agree with the radiologists' reports that there are no acute findings of the hands, and that there is a right distal clavicle fracture.  Patient's family also asked for a UA.  UA with small hemoglobin, 5 ketones, no leukocytes or nitrites.  Please see procedure notes above regarding repair of the forehead laceration with Dermabond and repair of multiple skin tears on the right elbow, right hand and right pointer finger after admin of LET.  Patient was placed in a shoulder sling for clavicle fracture.  Did offer the patient hydrocodone to take as needed for pain, but he opted for tylenol  and ibuprofen  use at home instead since he is having mild pain at this time.  Instructed to return to the ER if his pain is still severe despite Tylenol  and ibuprofen  use.  Tetanus updated.  Ambulatory referral to wound care center placed.  Wound care instructions discussed with the patient and family.  Will have him follow-up with orthopedics and with his PCP after today's visit.  The patient may return to the emergency department for any new, worsening, or concerning symptoms. Patient was given the opportunity to ask questions; all questions were answered. Emergency department return precautions were discussed with the patient.  Patient is in agreement to the treatment plan.    FINAL CLINICAL IMPRESSION(S) / ED DIAGNOSES   Final diagnoses:  Fall, initial encounter  Contusion of scalp, initial encounter  Laceration of scalp without foreign body, initial encounter  Abrasion of forehead, initial encounter  Multiple skin tears  Displaced fracture of lateral end of right clavicle, initial encounter for closed fracture      Rx / DC Orders   ED Discharge Orders          Ordered    AMB referral to wound care center        10/24/23 1712             Note:  This document was prepared using Dragon voice recognition software and may include unintentional dictation errors.     Sheron Clifton, [place] 10/24/23 1739  Ernest Ronal BRAVO, MD 10/25/23 [phone]

## 2023-10-24 NOTE — ED Notes (Signed)
 This tech changed pt after pt experienced incontinence. New brief and chuck placed.

## 2023-10-24 NOTE — Discharge Instructions (Addendum)
 You were seen today for an injury that resulted in a fracture of your clavicle.  Please use these sling provided as much as possible to keep the arm in place.  Take over-the-counter pain medicine as needed for discomfort.  Follow-up with the orthopedic doctor listed in his documentation as recommended.  Return to the emergency department if you develop new or worsening symptoms that concern you.   Clavicle Fracture The clavicle, also called the collarbone, is the long bone that connects your shoulder to your rib cage. You can feel your collarbone at the top of your shoulders and rib cage. A clavicle fracture is a broken clavicle. It is a common injury that can happen at any age.  CAUSES Common causes of a clavicle fracture include: A direct blow to your shoulder. A car accident. A fall, especially if you try to break your fall with an outstretched arm. RISK FACTORS You may be at increased risk if: You are younger than 25 years or older than 75 years. Most clavicle fractures happen to people who are younger than 25 years. You are a male. You play contact sports. SIGNS AND SYMPTOMS A fractured clavicle is painful. It also makes it hard to move your arm. Other signs and symptoms may include: A shoulder that drops downward and forward. Pain when trying to lift your shoulder. Bruising, swelling, and tenderness over your clavicle. A grinding noise when you try to move your shoulder. A bump over your clavicle. DIAGNOSIS Your health care provider can usually diagnose a clavicle fracture by asking about your injury and examining your shoulder and clavicle. He or she may take an X-ray to determine the position of your clavicle. TREATMENT Treatment depends on the position of your clavicle after the fracture: If the broken ends of the bone are not out of place, your health care provider may put your arm in a sling or wrap a support bandage around your chest (figure-of-eight wrap). If the broken ends  of the bone are out of place, you may need surgery. Surgery may involve placing screws, pins, or plates to keep your clavicle stable while it heals. Healing may take about 3 months. When your health care provider thinks your fracture has healed enough, you may have to do physical therapy to regain normal movement and build up your arm strength. HOME CARE INSTRUCTIONS  Apply ice to the injured area: Put ice in a plastic bag. Place a towel between your skin and the bag. Leave the ice on for 20 minutes, 2-3 times a day. If you have a wrap or splint: Wear it all the time, and remove it only to take a bath or shower. When you bathe or shower, keep your shoulder in the same position as when the sling or wrap is on. Do not lift your arm. If you have a figure-of-eight wrap: Another person must tighten it every day. It should be tight enough to hold your shoulders back. Allow enough room to place your index finger between your body and the strap. Loosen the wrap immediately if you feel numbness or tingling in your hands. Only take medicines as directed by your health care provider. Avoid activities that make the injury or pain worse for 4-6 weeks after surgery. Keep all follow-up appointments. SEEK MEDICAL CARE IF:  Your medicine is not helping to relieve pain and swelling. SEEK IMMEDIATE MEDICAL CARE IF:  Your arm is numb, cold, or pale, even when the splint is loose. MAKE SURE YOU:  Understand  these instructions. Will watch your condition. Will get help right away if you are not doing well or get worse.   This information is not intended to replace advice given to you by your health care provider. Make sure you discuss any questions you have with your health care provider.   Cuts, Abrasions You have been seen in the Emergency Department (ED) today for a laceration (cut).  We were able to close it with skin glue and/or tape.  Please keep the wound dry for about 24 hours.  At that point you can  get it wet, in the shower, for example, but do not submerge it in water.  In 1 to 2 weeks the glue and/or tape will start to come off on its own.  Please do not pull it off early, allow it to fall off on its own.  If there are edges that are starting to pull up, you can trim them with a clean pair of small scissors.  Please take Tylenol  (acetaminophen ) or Motrin  (ibuprofen ) as needed for discomfort as written on the box.   Please follow up with your doctor as soon as possible regarding today's emergent visit. Please also follow up with the wound care center listed in this paperwork, you will need to give them a call.  Return to the ED or call your doctor if you notice any signs of infection such as fever, increased pain, increased redness, pus, or other symptoms that concern you.

## 2023-11-24 ENCOUNTER — Ambulatory Visit: Admitting: Physician Assistant

## 2023-12-07 ENCOUNTER — Other Ambulatory Visit: Payer: Self-pay | Admitting: Internal Medicine

## 2023-12-07 DIAGNOSIS — M5416 Radiculopathy, lumbar region: Secondary | ICD-10-CM

## 2023-12-08 ENCOUNTER — Ambulatory Visit
Admission: RE | Admit: 2023-12-08 | Discharge: 2023-12-08 | Disposition: A | Discharge status: Home / Self Care | Source: Ambulatory Visit | Attending: Internal Medicine | Admitting: Internal Medicine

## 2023-12-08 DIAGNOSIS — M5416 Radiculopathy, lumbar region: Secondary | ICD-10-CM | POA: Insufficient documentation

## 2023-12-16 ENCOUNTER — Other Ambulatory Visit: Payer: Self-pay | Admitting: Orthopedic Surgery

## 2023-12-16 DIAGNOSIS — S32040A Wedge compression fracture of fourth lumbar vertebra, initial encounter for closed fracture: Secondary | ICD-10-CM

## 2023-12-16 DIAGNOSIS — S32030A Wedge compression fracture of third lumbar vertebra, initial encounter for closed fracture: Secondary | ICD-10-CM

## 2023-12-16 DIAGNOSIS — S32010A Wedge compression fracture of first lumbar vertebra, initial encounter for closed fracture: Secondary | ICD-10-CM

## 2023-12-16 DIAGNOSIS — M48062 Spinal stenosis, lumbar region with neurogenic claudication: Secondary | ICD-10-CM

## 2023-12-16 NOTE — Progress Notes (Signed)
 This televideo encounter was conducted with the patient's (or proxy's) verbal consent via audio/video telecommunications.    Patient was instructed to have this encounter in a suitably private space; and to only have persons present to whom they give permission to participate.  In addition, patient identity was confirmed by use of two identifiers (name and date of birth). Patient identity was confirmed by use of two identifiers (name and date of birth): yes  Televideo Documentation Note for Evaluation  Subjective:   Chad Sandoval is a 78 y.o. male who calls today for change in status - insomnia and sundowning  Sundowning and insomnia: Patient has been declining significantly per spouse.  Unable to sleep at night.  She had to give 100 mg of Seroquel  last night to calm him down.  No complaints from him with regards to dysuria or fever.  He is not responsive in his baseline manner at night.  He seems agitated and incoherent.  Not able to perform any ADLs at this time.  Currently followed by palliative care.  Patient Active Problem List  Diagnosis  . Erectile dysfunction  . Type 2 diabetes mellitus with hyperlipidemia (A1c 6.5% - 08/06/23)  . Essential hypertension  . Personal history of prostate cancer - followed by Dr. Nicholaus [facility])  . Parkinson's disease (CMS-HCC) - followed by Dr. Maree  . Pure hypercholesterolemia (LDL 49 - 08/06/23)  . Medicare annual wellness visit, initial: 02/06/14  . Medicare annual wellness visit, subsequent 08/13/23  . Radiation cystitis  . RBBB (right bundle branch block)  . RBD (REM behavioral disorder)  . Anxiety, generalized  . Lymphocytosis (WBC 13.2 - 08/06/23)  . Bladder neck obstruction  . Post-traumatic bulbous urethral stricture  . Recurrent nephrolithiasis  . Palliative care patient    Current Outpatient Medications:  .  acetaminophen  (TYLENOL  ORAL), Take by mouth as needed, Disp: , Rfl:  .  amantadine  HCL (SYMMETREL ) 100 mg tablet, TAKE 1 TABLET (100  MG TOTAL) BY MOUTH 3 TIMES PER DAY, Disp: 270 tablet, Rfl: 1 .  carbidopa -levodopa  (SINEMET  CR) 50-200 mg CR tablet, Take 1 tablet by mouth at bedtime, Disp: 90 tablet, Rfl: 3 .  carbidopa -levodopa  (SINEMET ) 25-100 mg tablet, TAKE 2 TABLETS BY MOUTH 4 TIMES DAILY., Disp: 720 tablet, Rfl: 3 .  ELMIRON  100 mg capsule, TAKE 1 CAPSULE (100 MG TOTAL) BY MOUTH 3 TIMES DAILY BEFORE MEALS., Disp: , Rfl: 11 .  glycopyrrolate  (ROBINUL ) 1 mg tablet, Take 1 tablet (1 mg total) by mouth 2 (two) times daily (Patient taking differently: Take 1 mg by mouth once daily), Disp: 180 tablet, Rfl: 3 .  HYDROcodone-acetaminophen  (NORCO) 5-325 mg tablet, Take 0.5-1 tablets by mouth every 6 (six) hours as needed for Pain, Disp: 30 tablet, Rfl: 0 .  ketoconazole  (NIZORAL ) 2 % shampoo, Apply topically as needed for Itching, Disp: , Rfl:  .  losartan (COZAAR) 25 MG tablet, Take 25 mg by mouth once daily, Disp: , Rfl:  .  lovastatin (MEVACOR) 20 MG tablet, TAKE 1 TABLET BY MOUTH EVERY DAY AT NIGHT, Disp: 90 tablet, Rfl: 1 .  metFORMIN (GLUCOPHAGE-XR) 500 MG XR tablet, Take 1 tablet (500 mg total) by mouth daily with breakfast, Disp: , Rfl:  .  QUEtiapine  (SEROQUEL ) 50 MG tablet, Take 1 tablet (50 mg total) by mouth at bedtime (Patient taking differently: Take 100 mg by mouth at bedtime), Disp: 90 tablet, Rfl: 3 .  sildenafil (VIAGRA) 100 MG tablet, Take 100 mg by mouth once daily as needed, Disp: ,  Rfl:  .  venlafaxine  (EFFEXOR ) 25 MG tablet, Take 1 tablet (25 mg total) by mouth 2 (two) times daily (Patient taking differently: Take 25 mg by mouth once daily), Disp: 180 tablet, Rfl: 3  Current Facility-Administered Medications:  .  meningococcal vaccine (PF) (MENOMUNE) injection 0.5 mL, 0.5 mL, Subcutaneous, Prior to discharge, Verruto, Lucie Kobs, PA   Social History   Tobacco Use  . Smoking status: Never    Passive exposure: Past  . Smokeless tobacco: Never  . Tobacco comments:    never used  Vaping Use  . Vaping  status: Never Used  Substance Use Topics  . Alcohol use: Not Currently    Alcohol/week: 1.0 standard drink of alcohol    Types: 1 Cans of beer per week    Comment: occasionally  . Drug use: Never    Family History  Problem Relation Name Age of Onset  . Alzheimer's disease Mother 38   . High blood pressure (Hypertension) Mother 24   . Cancer Father Keyshawn,Sr        Prostate  . Heart disease Father Angello,Sr        first heart attack at 78 yrs old. t  . High blood pressure (Hypertension) Father Loran,Sr        died at age 70  . Prostate cancer Father Emmitte,Sr   . Thyroid disease Father Clemence,Sr   . Rheum arthritis Sister Orie   . Rheum arthritis Sister Orie   . Cirrhosis Neg Hx    . HCC Neg Hx    . Liver disease Neg Hx       Current Outpatient Medications:  .  acetaminophen  (TYLENOL  ORAL), Take by mouth as needed, Disp: , Rfl:  .  amantadine  HCL (SYMMETREL ) 100 mg tablet, TAKE 1 TABLET (100 MG TOTAL) BY MOUTH 3 TIMES PER DAY, Disp: 270 tablet, Rfl: 1 .  carbidopa -levodopa  (SINEMET  CR) 50-200 mg CR tablet, Take 1 tablet by mouth at bedtime, Disp: 90 tablet, Rfl: 3 .  carbidopa -levodopa  (SINEMET ) 25-100 mg tablet, TAKE 2 TABLETS BY MOUTH 4 TIMES DAILY., Disp: 720 tablet, Rfl: 3 .  ELMIRON  100 mg capsule, TAKE 1 CAPSULE (100 MG TOTAL) BY MOUTH 3 TIMES DAILY BEFORE MEALS., Disp: , Rfl: 11 .  glycopyrrolate  (ROBINUL ) 1 mg tablet, Take 1 tablet (1 mg total) by mouth 2 (two) times daily (Patient taking differently: Take 1 mg by mouth once daily), Disp: 180 tablet, Rfl: 3 .  HYDROcodone-acetaminophen  (NORCO) 5-325 mg tablet, Take 0.5-1 tablets by mouth every 6 (six) hours as needed for Pain, Disp: 30 tablet, Rfl: 0 .  ketoconazole  (NIZORAL ) 2 % shampoo, Apply topically as needed for Itching, Disp: , Rfl:  .  losartan (COZAAR) 25 MG tablet, Take 25 mg by mouth once daily, Disp: , Rfl:  .  lovastatin (MEVACOR) 20 MG tablet, TAKE 1 TABLET BY MOUTH EVERY DAY AT NIGHT, Disp: 90 tablet, Rfl:  1 .  metFORMIN (GLUCOPHAGE-XR) 500 MG XR tablet, Take 1 tablet (500 mg total) by mouth daily with breakfast, Disp: , Rfl:  .  QUEtiapine  (SEROQUEL ) 50 MG tablet, Take 1 tablet (50 mg total) by mouth at bedtime (Patient taking differently: Take 100 mg by mouth at bedtime), Disp: 90 tablet, Rfl: 3 .  sildenafil (VIAGRA) 100 MG tablet, Take 100 mg by mouth once daily as needed, Disp: , Rfl:  .  venlafaxine  (EFFEXOR ) 25 MG tablet, Take 1 tablet (25 mg total) by mouth 2 (two) times daily (Patient taking differently: Take 25 mg  by mouth once daily), Disp: 180 tablet, Rfl: 3  Current Facility-Administered Medications:  .  meningococcal vaccine (PF) (MENOMUNE) injection 0.5 mL, 0.5 mL, Subcutaneous, Prior to discharge, Verruto, Samantha Kwan, PA Allergies  Allergen Reactions  . Oxycodone Other (See Comments)    Facial flushing and temperature increases to 38.5  . Niacin Other (See Comments)    PER PT-MADE HIM FEEL REALLY HOT ALL OVER BODY   PER PT-MADE HIM FEEL REALLY HOT ALL OVER BODY     . Sulfa (Sulfonamide Antibiotics) Rash     ROS:  General: No fever, chills or recent illness. No change in weight Skin:   No skin lesions, growths, masses, rashes, pruritus  HEENT: No change in vision or hearing. No pain or difficulty with swallowing Respiratory: No cough or shortness of breath CV:  No chest pain or palpitations GI:  No pain, dyspepsia or change in bowel habits GU:  No dysuria, frequency, or hesitancy MSK:  No joint pain or injury Neurological: No headaches, changes in mental status, loss of sensation or strength Endocrine:  No heat or cold intolerance, polydipsia, polyuria  Assessment/Plan:   Insomnia, unspecified type  (primary encounter diagnosis) Sundowning  Assessment and Plan  1. Insomnia, unspecified type Acute.  Not responding to Seroquel .  Unable to stop Seroquel  at this time.  Continue with Seroquel  50 mg and add trazodone  50 mg at bedtime tonight.  Likely exacerbated by  underlying sundowning.  Not in a state at this time to do any type of imaging studies.  Will rule out urinary tract infection.  Spouse is going to collect urine for us  and then we will test the urine for infection.  2. Sundowning Acute.  Possibly exacerbated by underlying infection.  Plan per #1.  3. Declining functional status Acute.  Unable to perform any ADLs at this time.  Plan to set up hospice referral for evaluation.  Plan to rule out acute infection.  Will try to alleviate insomnia as this could exacerbate acute delirium.  Other orders -     traZODone  (DESYREL ) 50 MG tablet; Take 1 tablet (50 mg total) by mouth at bedtime as needed for Sleep   I spent a total of 15 minutes discussing with the patient concerning the above documentation. With a total of 50% time spent on the coordination of care and counseling.   F/u pending clinical state; UA and urine cx pending; hospice referral pending  Future Appointments     Date/Time Provider Department Center Visit Type   01/07/2024 10:45 AM Maree Jannett Hering, MD [facility] C RETURN VISIT   02/11/2024 9:00 AM [facility] C LAB   02/23/2024 10:45 AM Alla Amis, MD [facility] MARYL BROCKS [facility] OFFICE VISIT   03/15/2024 10:15 AM Ashley Eva Dawn, DPM [facility] RETURN PODIATRY       There are no Patient Instructions on file for this visit.  This video encounter was conducted with the patient's (or proxy's) verbal consent via secure, interactive audio and video telecommunications while in clinic/office/hospital.  The patient (or proxy) was instructed to have this encounter in a suitably private space and to only have persons present to whom they give permission to participate. In addition, patient identity was confirmed by use of name plus an additional identifier.  This visit was coded based on medical decision making (MDM).

## 2023-12-20 NOTE — Progress Notes (Shared)
 Chief Complaint: Patient was seen in consultation today for lumbar compression fractures.   Referring Physician(s): Gaines,Thomas C  History of Present Illness: Chad Sandoval is a 78 y.o. male with a medical history significant for HTN, prostate cancer (s/p radical prostatectomy), marginal zone lymphoma of the spleen and Parkinson's. He has unfortunately suffered several falls in the last few months and has been evaluated several times in the ED for his injuries. He sustained a right clavicle fracture and forehead laceration in August and he was referred to Orthopedic Surgery. They recommended conservative management for his clavicle fracture and when he followed up with them mid-October he complained of severe back pain. The patient also complained of numbness, tingling and a burning sensation down the back of both legs.  An MRI of the lumbar spine was obtained 12/08/23 and this revealed numerous lumbar compression fractures. L1 and L3 appear to be subacute but an L4 fracture appears more acute. The patient has been kindly referred to Interventional Radiology for vertebral body augmentation. He presents today for further discussion. His pain has been complete debilitating.  He is unable to ambulate.  His pain is quoted as an 11/10 today, improved with medication to a 7-9/10.  His pain has not improved over the past month since onset.  He scores an 18/24 on the [employer] Disability Questionnaire today.  He does have some burning pain down the posterior aspect of his right leg, which has been present for some time. He has had prior epidural steroid injections which have helped relieve that pain in the past. He is accompanied today by his wife and daughter.  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 Authorizing Provider  Amantadine  HCl 100 MG tablet Take 100 mg by mouth 3 (three) times daily. Admin times: 1000, 1400 & 1800 05/20/20   [provider]  carbidopa -levodopa  (SINEMET  CR) 50-200 MG tablet Take 1 tablet by mouth at bedtime. 02/27/20   [provider]  carbidopa -levodopa  (SINEMET  IR) 25-100 MG tablet Take 2 tablets by mouth 4 (four) times daily. Admin times: 0700, 1000, 1400, & 1800    [provider]  glycopyrrolate  (ROBINUL ) 1 MG tablet Take 1 mg by mouth daily.    [provider]  lovastatin (MEVACOR) 20 MG tablet TAKE 1 TABLET BY MOUTH EVERY DAY AT NIGHT 08/29/18   [provider]  metFORMIN (GLUCOPHAGE-XR) 500 MG 24 hr tablet Take 1,000 mg by mouth every morning.    [provider]  pentosan polysulfate (ELMIRON ) 100 MG capsule Take 100 mg by mouth 3 (three) times daily. Admin times: 1000, 1600 & 2200 11/02/17   [provider]  QUEtiapine  (SEROQUEL ) 50 MG tablet Take 50 mg by mouth at  bedtime.    [provider]  venlafaxine  (EFFEXOR ) 25 MG tablet Take 1 tablet (25 mg total) by mouth daily. 10/05/23 11/04/23  Leesa Kast, DO     Family History  Problem Relation Age of Onset   Cancer Father        prostate   Alzheimer's disease Mother     Social History   Socioeconomic History   Marital status: Married    Spouse name: Not on file   Number of children: Not on file   Years  of education: Not on file   Highest education level: Not on file  Occupational History   Not on file  Tobacco Use   Smoking status: Never   Smokeless tobacco: Never  Substance and Sexual Activity   Alcohol use: No   Drug use: No   Sexual activity: Not on file  Other Topics Concern   Not on file  Social History Narrative   Not on file   Social Drivers of Health   Financial Resource Strain: Low Risk  (12/08/2023)   Received from [facility]   Overall Financial Resource Strain (CARDIA)    Difficulty of Paying Living Expenses: Not hard at all  Food Insecurity: No Food Insecurity (12/08/2023)   Received from [facility]   Hunger Vital Sign    Within the past 12 months, you worried that your food would run out before you got the money to buy more.: Never true    Within the past 12 months, the food you bought just didn't last and you didn't have money to get more.: Never true  Transportation Needs: No Transportation Needs (12/08/2023)   Received from [facility] - Transportation    In the past 12 months, has lack of transportation kept you from medical appointments or from getting medications?: No    Lack of Transportation (Non-Medical): No  Physical Activity: Not on file  Stress: Not on file  Social Connections: Unknown (10/04/2023)   Social Connection and Isolation Panel    Frequency of Communication with Friends and Family: Not on file    Frequency of Social Gatherings with Friends and Family: Never    Attends Religious Services: Not on Marketing Executive or Organizations: Not on file    Attends Banker Meetings: Not on file    Marital Status: Not on file    Review of Systems: A 12 point ROS discussed and pertinent positives are indicated in the HPI above.  All other systems are negative.    Vital Signs: There were no vitals taken for this visit.  Advance Care Plan: The advanced care  plan/surrogate decision maker was discussed at the time of visit and documented in the medical record.    Physical Exam Constitutional:      General: He is not in acute distress. HENT:     Head: Normocephalic.     Mouth/Throat:     Mouth: Mucous membranes are moist.  Eyes:     General: No scleral icterus. Cardiovascular:     Rate and Rhythm: Normal rate and regular rhythm.  Pulmonary:     Effort: No respiratory distress.  Abdominal:     General: There is no distension.  Musculoskeletal:       Arms:     Right lower leg: No edema.     Left lower leg: No edema.     Comments: Tender to closed fist percussion  in the upper and lower lumbar spine.  Skin:    General: Skin is warm and dry.  Neurological:     Mental Status: He is alert and oriented to person, place, and time.     Imaging:  MR Lumbar Spine 12/08/23    Subacute appearing L1 and L4 compression fractures with >25% height reduction.  No significant retropulsion or fragmentation.   Labs:  CBC: Recent Labs    10/03/23 1247 10/04/23 0421 10/05/23 0813  WBC 16.4* 17.1* 14.4*  HGB 16.0 14.7 14.6  HCT 47.6 44.0 43.0  PLT 321 302 331    COAGS: Recent Labs    10/04/23 0421  INR 1.1    BMP: Recent Labs    10/03/23 1247 10/04/23 0421 10/05/23 0813  NA 135 140 137  K 4.3 3.9 3.9  CL 104 107 104  CO2 25 27 25   GLUCOSE 122* 101* 105*  BUN 21 15 16   CALCIUM 9.7 9.2 9.2  CREATININE 0.98 0.86 0.79  GFRNONAA >60 >60 >60    LIVER FUNCTION TESTS: Recent Labs    10/03/23 1247 10/05/23 0813  BILITOT 0.9 0.8  AST 21 17  ALT <5 <5  ALKPHOS 164* 129*  PROT 7.1 6.2*  ALBUMIN 4.2 3.5    TUMOR MARKERS: No results for input(s): AFPTM, CEA, CA199, CHROMGRNA in the last 8760 hours.  Assessment and Plan:  78 year old male with a history of Parkinson's and several recent falls now with severe back pain.  Assessment & Plan:   Patient has suffered subacute osteoporotic fracture of the L1 and  L4 vertebra.   History and exam have demonstrated the following:  Acute/Subacute fracture by imaging dated 12/08/23, Pain on exam concordant with level of fracture, Failure of conservative therapy and pain refractory to narcotic pain mediation, Inability to tolerate narcotic pain medication due to side effects, and Significant disability on the [employer] Disability Questionnaire with 18/24 positive symptoms, reflecting significant impact/impairment of (ADLs)   ICD-10-CM Codes that Support Medical Necessity (welshblog.at.aspx?articleId=[email or web address])  M80.08XA    Age-related osteoporosis with current pathological fracture, vertebra(e), initial encounter for fracture   Plan:  L1 and L4 vertebral body augmentation with balloon kyphoplasty  Post-procedure disposition: outpatient - [facility] Medication holds: none  The patient has suffered a fracture of the L1 and L4 vertebral bodies. It is recommended that patients aged 43 years or older be evaluated for possible testing or treatment of osteoporosis. A copy of this consult report is sent to the patient's referring physician.  Advanced Care Plan: The patient has an Advanced Care Plan or Surrogate Decision Maker documented in the EMR     Ester Sides, MD Pager: [phone]    I spent a total of  40 Minutes   in face to face in clinical consultation, greater than 50% of which was counseling/coordinating care for lumbar compression fractures.

## 2023-12-21 ENCOUNTER — Ambulatory Visit
Admission: RE | Admit: 2023-12-21 | Discharge: 2023-12-21 | Disposition: A | Source: Ambulatory Visit | Attending: Orthopedic Surgery | Admitting: Orthopedic Surgery

## 2023-12-21 DIAGNOSIS — M48062 Spinal stenosis, lumbar region with neurogenic claudication: Secondary | ICD-10-CM

## 2023-12-21 DIAGNOSIS — S32010A Wedge compression fracture of first lumbar vertebra, initial encounter for closed fracture: Secondary | ICD-10-CM

## 2023-12-21 DIAGNOSIS — S32040A Wedge compression fracture of fourth lumbar vertebra, initial encounter for closed fracture: Secondary | ICD-10-CM

## 2023-12-21 DIAGNOSIS — S32030A Wedge compression fracture of third lumbar vertebra, initial encounter for closed fracture: Secondary | ICD-10-CM

## 2023-12-21 HISTORY — PX: IR RADIOLOGIST EVAL & MGMT: IMG5224

## 2023-12-28 ENCOUNTER — Other Ambulatory Visit: Payer: Self-pay | Admitting: Interventional Radiology

## 2023-12-28 DIAGNOSIS — M8008XA Age-related osteoporosis with current pathological fracture, vertebra(e), initial encounter for fracture: Secondary | ICD-10-CM

## 2023-12-29 ENCOUNTER — Inpatient Hospital Stay
Admission: RE | Admit: 2023-12-29 | Discharge: 2023-12-29 | Attending: Interventional Radiology | Admitting: Interventional Radiology

## 2023-12-29 DIAGNOSIS — M8008XA Age-related osteoporosis with current pathological fracture, vertebra(e), initial encounter for fracture: Secondary | ICD-10-CM

## 2023-12-29 HISTORY — PX: IR KYPHO EA ADDL LEVEL THORACIC OR LUMBAR: IMG5520

## 2023-12-29 HISTORY — PX: IR KYPHO LUMBAR INC FX REDUCE BONE BX UNI/BIL CANNULATION INC/IMAGING: IMG5519

## 2023-12-29 MED ORDER — ACETAMINOPHEN 10 MG/ML IV SOLN
1000.0000 mg | Freq: Once | INTRAVENOUS | Status: AC
  Administered 2023-12-29: 1000 mg via INTRAVENOUS

## 2023-12-29 MED ORDER — MIDAZOLAM HCL (PF) 2 MG/2ML IJ SOLN
1.0000 mg | INTRAMUSCULAR | Status: DC | PRN
  Administered 2023-12-29 (×2): 1 mg via INTRAVENOUS

## 2023-12-29 MED ORDER — FENTANYL CITRATE (PF) 50 MCG/ML IJ SOSY
25.0000 ug | PREFILLED_SYRINGE | INTRAMUSCULAR | Status: DC | PRN
  Administered 2023-12-29 (×2): 50 ug via INTRAVENOUS

## 2023-12-29 MED ORDER — LIDOCAINE HCL (PF) 1 % IJ SOLN
20.0000 mL | Freq: Once | INTRAMUSCULAR | Status: AC
  Administered 2023-12-29: 20 mL via INTRADERMAL

## 2023-12-29 MED ORDER — CEFAZOLIN SODIUM-DEXTROSE 2-4 GM/100ML-% IV SOLN
2.0000 g | INTRAVENOUS | Status: AC
  Administered 2023-12-29: 2 g via INTRAVENOUS

## 2023-12-29 MED ORDER — SODIUM CHLORIDE 0.9 % IV SOLN: INTRAVENOUS | Status: DC

## 2023-12-29 NOTE — Discharge Instructions (Addendum)

## 2024-01-05 ENCOUNTER — Ambulatory Visit: Admitting: Dermatology

## 2024-01-06 ENCOUNTER — Telehealth: Payer: Self-pay

## 2024-01-08 ENCOUNTER — Other Ambulatory Visit: Payer: Self-pay | Admitting: Interventional Radiology

## 2024-01-08 DIAGNOSIS — S32040G Wedge compression fracture of fourth lumbar vertebra, subsequent encounter for fracture with delayed healing: Secondary | ICD-10-CM

## 2024-01-08 DIAGNOSIS — S32010G Wedge compression fracture of first lumbar vertebra, subsequent encounter for fracture with delayed healing: Secondary | ICD-10-CM

## 2024-01-19 ENCOUNTER — Other Ambulatory Visit

## 2024-01-28 ENCOUNTER — Inpatient Hospital Stay
Admission: RE | Admit: 2024-01-28 | Discharge: 2024-01-28 | Disposition: A | Source: Ambulatory Visit | Attending: Interventional Radiology | Admitting: Interventional Radiology

## 2024-01-28 DIAGNOSIS — S32010G Wedge compression fracture of first lumbar vertebra, subsequent encounter for fracture with delayed healing: Secondary | ICD-10-CM

## 2024-01-28 DIAGNOSIS — S32040G Wedge compression fracture of fourth lumbar vertebra, subsequent encounter for fracture with delayed healing: Secondary | ICD-10-CM

## 2024-01-28 HISTORY — PX: IR RADIOLOGIST EVAL & MGMT: IMG5224

## 2024-01-28 NOTE — Progress Notes (Signed)
 Chief Complaint: Patient was seen in consultation today for osteoporotic compression fractures at L1 and L4 at the request of Corda Shutt K  Referring Physician(s): Vana Arif K  History of Present Illness: Chad Sandoval. is a 78 y.o. 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  His pain is 50% improved and he able to ambulate with his walker again.  He continues to work with physical therapy in-home twice a week.     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   right dorsum medial middle forearm treated with ED&C   Thrombocytopenia     Past Surgical History:  Procedure Laterality Date   IR KYPHO EA ADDL LEVEL THORACIC OR LUMBAR  12/29/2023   IR KYPHO LUMBAR INC FX REDUCE BONE BX UNI/BIL CANNULATION INC/IMAGING  12/29/2023   IR RADIOLOGIST EVAL & MGMT  12/21/2023   IR RADIOLOGIST EVAL & MGMT  01/28/2024   RETROPUBIC PROSTATECTOMY  03/02/2001   [facility]   SPLENECTOMY  04/2012   [facility]    Allergies: Niacin, Oxycontin [oxycodone hcl], and Sulfa antibiotics  Medications: Prior to Admission medications   Medication Sig Start Date End Date Taking? Authorizing Provider  Amantadine  HCl 100 MG tablet Take 100 mg by mouth 3 (three) times daily. Admin times: 1000, 1400 & 1800 05/20/20   [provider]  carbidopa -levodopa  (SINEMET  CR) 50-200 MG tablet Take 1 tablet by mouth at bedtime. 02/27/20   [provider]  carbidopa -levodopa  (SINEMET  IR) 25-100 MG tablet Take 2 tablets by mouth 4 (four) times daily. Admin times: 0700, 1000, 1400, &  1800    [provider]  glycopyrrolate  (ROBINUL ) 1 MG tablet Take 1 mg by mouth daily.    [provider]  lovastatin (MEVACOR) 20 MG tablet TAKE 1 TABLET BY MOUTH EVERY DAY AT NIGHT 08/29/18   [provider]  meloxicam (MOBIC) 7.5 MG tablet Take 15 mg by mouth daily.    [provider]  metFORMIN (GLUCOPHAGE-XR) 500 MG 24 hr tablet Take 1,000 mg by mouth every morning.    [provider]  pentosan polysulfate (ELMIRON ) 100 MG capsule Take 100 mg by mouth 3 (three) times daily. Admin times: 1000, 1600 & 2200 11/02/17   [provider]  QUEtiapine  (SEROQUEL ) 50 MG tablet Take 50 mg by mouth at bedtime.    [provider]  venlafaxine  (EFFEXOR ) 25 MG tablet Take 1 tablet (25 mg total) by mouth daily. 10/05/23 11/04/23  Dezii, Alexandra, DO     Family History  Problem Relation Age of Onset   Cancer Father        prostate   Alzheimer's disease Mother     Social History   Socioeconomic History   Marital status: Married    Spouse name: Not on file   Number of children: Not on file   Years of education: Not on file   Highest education level: Not on file  Occupational History   Not on file  Tobacco Use   Smoking status: Never   Smokeless tobacco:  Never  Substance and Sexual Activity   Alcohol use: No   Drug use: No   Sexual activity: Not on file  Other Topics Concern   Not on file  Social History Narrative   Not on file   Social Drivers of Health   Financial Resource Strain: Low Risk  (12/08/2023)   Received from [facility]   Overall Financial Resource Strain (CARDIA)    Difficulty of Paying Living Expenses: Not hard at all  Food Insecurity: No Food Insecurity (12/08/2023)   Received from [facility]   Hunger Vital Sign    Within the past 12 months, you worried that your food would run out before you got the money to buy more.: Never true    Within the past 12 months, the food you  bought just didn't last and you didn't have money to get more.: Never true  Transportation Needs: No Transportation Needs (12/08/2023)   Received from [facility] - Transportation    In the past 12 months, has lack of transportation kept you from medical appointments or from getting medications?: No    Lack of Transportation (Non-Medical): No  Physical Activity: Not on file  Stress: Not on file  Social Connections: Unknown (10/04/2023)   Social Connection and Isolation Panel    Frequency of Communication with Friends and Family: Not on file    Frequency of Social Gatherings with Friends and Family: Never    Attends Religious Services: Not on Marketing Executive or Organizations: Not on file    Attends Banker Meetings: Not on file    Marital Status: Not on file    Review of Systems: A 12 point ROS discussed and pertinent positives are indicated in the HPI above.  All other systems are negative.  Review of Systems  Vital Signs: BP 139/77 (BP Location: Left Arm, Patient Position: Sitting, Cuff Size: Normal)   Pulse 92   Temp 97.9 F (36.6 C) (Oral)   Resp 16   SpO2 95%    Physical Exam Constitutional:      General: He is not in acute distress.    Appearance: Normal appearance. He is normal weight.  HENT:     Head: Normocephalic and atraumatic.  Eyes:     General: No scleral icterus. Cardiovascular:     Rate and Rhythm: Normal rate.  Pulmonary:     Effort: Pulmonary effort is normal.  Abdominal:     General: There is no distension.     Tenderness: There is no abdominal tenderness. There is no guarding.  Musculoskeletal:        General: No swelling, tenderness or deformity.  Skin:    General: Skin is warm and dry.  Neurological:     Mental Status: He is alert and oriented to person, place, and time.      Imaging: IR Radiologist Eval & Mgmt Result Date: 01/28/2024 EXAM: ESTABLISHED PATIENT OFFICE VISIT CHIEF  COMPLAINT: SEE NOTE IN EPIC HISTORY OF PRESENT ILLNESS: SEE NOTE IN EPIC REVIEW OF SYSTEMS: SEE NOTE IN EPIC PHYSICAL EXAMINATION: SEE NOTE IN EPIC ASSESSMENT AND PLAN: SEE NOTE IN EPIC Electronically Signed   By: Wilkie Lent M.D.   On: 01/28/2024 10:54   IR KYPHO LUMBAR INC FX REDUCE BONE BX UNI/BIL CANNULATION INC/IMAGING Result Date: 12/29/2023 CLINICAL DATA:  78 year old male with highly symptomatic osteoporotic compression fractures of the L1 and L4 vertebral bodies. He has  failed conservative management and therefore presents for cement augmentation with balloon kyphoplasty of both levels. EXAM: FLUOROSCOPIC GUIDED KYPHOPLASTY OF THE L1 VERTEBRAL BODY FLUOROSCOPIC GUIDED KYPHOPLASTY OF THE L4 VERTEBRAL BODY COMPARISON:  None Available. MEDICATIONS: As antibiotic prophylaxis, 2 g Ancef  was ordered pre-procedure and administered intravenously by the Radiology nurse within 1 hour of incision. 1000 mg Tylenol  was administered intravenously by the Radiology nurse following the procedure. ANESTHESIA/SEDATION: Moderate (conscious) sedation was employed during this procedure. A total of Versed  2 mg and Fentanyl  100 mcg was administered intravenously by the Radiology nurse. Moderate Sedation Time: 30 minutes. The patient's level of consciousness and vital signs were monitored continuously by radiology nursing throughout the procedure under my direct supervision. FLUOROSCOPY TIME:  Radiation exposure index: 78.4 mGy, air kerma COMPLICATIONS: None immediate. PROCEDURE: The procedure, risks (including but not limited to bleeding, infection, organ damage), benefits, and alternatives were explained to the patient. Questions regarding the procedure were encouraged and answered. The patient understands and consents to the procedure. The patient was placed prone on the fluoroscopic table. The skin overlying the lumbar region was then prepped and draped in the usual sterile fashion. Maximal barrier sterile technique  was utilized including caps, mask, sterile gowns, sterile gloves, sterile drape, hand hygiene and skin antiseptic. Intravenous Fentanyl  and Versed  were administered as conscious sedation during continuous cardiorespiratory monitoring by the radiology RN. L1 The left pedicle at L1 was then infiltrated with 1% lidocaine  followed by the advancement of a Kyphon trocar needle through the left pedicle into the posterior one-third of the vertebral body. Subsequently, the osteo drill was advanced to the anterior third of the vertebral body. The osteo drill was retracted. Through the working cannula, a Kyphon inflatable bone tamp 15 x 2.5 was advanced and positioned with the distal marker approximately 5 mm from the anterior aspect of the cortex. Appropriate positioning was confirmed on the AP projection. At this time, the balloon was expanded using contrast via a Kyphon inflation syringe device via micro tubing. Inflations were continued until there was near apposition with the superior end plate. At this time, methylmethacrylate mixture was reconstituted in the Kyphon bone mixing device system. This was then loaded into the delivery mechanism, attached to Kyphon bone fillers. The balloons were deflated and removed followed by the instillation of methylmethacrylate mixture with excellent filling in the AP and lateral projections. No extravasation was noted in the disk spaces or posteriorly into the spinal canal. No epidural venous contamination was seen. The working cannulae and the bone filler were then retrieved and removed. Hemostasis was achieved with manual compression. The patient tolerated the procedure well without immediate postprocedural complication. L4 The right pedicle at L4 was then infiltrated with 1% lidocaine  followed by the advancement of a Kyphon trocar needle through the left pedicle into the posterior one-third of the vertebral body. Subsequently, the osteo drill was advanced to the anterior third of  the vertebral body. The osteo drill was retracted. Through the working cannula, a Kyphon inflatable bone tamp 15 x 2.5 was advanced and positioned with the distal marker approximately 5 mm from the anterior aspect of the cortex. Appropriate positioning was confirmed on the AP projection. At this time, the balloon was expanded using contrast via a Kyphon inflation syringe device via micro tubing. Inflations were continued until there was near apposition with the superior end plate. At this time, methylmethacrylate mixture was reconstituted in the Kyphon bone mixing device system. This was then loaded into the delivery mechanism, attached to  Kyphon bone fillers. The balloons were deflated and removed followed by the instillation of methylmethacrylate mixture with excellent filling in the AP and lateral projections. No extravasation was noted in the disk spaces or posteriorly into the spinal canal. No epidural venous contamination was seen. The working cannulae and the bone filler were then retrieved and removed. Hemostasis was achieved with manual compression. The patient tolerated the procedure well without immediate postprocedural complication. IMPRESSION: 1. Technically successful L1 vertebral body augmentation using balloon kyphoplasty. 2. Technically successful L4 vertebral body augmentation using balloon kyphoplasty. 3. Per CMS PQRS reporting requirements (PQRS Measure 24): Given the patient's age of greater than 50 and the fracture site (hip, distal radius, or spine), the patient should be tested for osteoporosis using DXA, and the appropriate treatment considered based on the DXA results. Electronically Signed   By: Wilkie Lent M.D.   On: 12/29/2023 13:01   IR KYPHO EA ADDL LEVEL THORACIC OR LUMBAR Result Date: 12/29/2023 CLINICAL DATA:  78 year old male with highly symptomatic osteoporotic compression fractures of the L1 and L4 vertebral bodies. He has failed conservative management and therefore  presents for cement augmentation with balloon kyphoplasty of both levels. EXAM: FLUOROSCOPIC GUIDED KYPHOPLASTY OF THE L1 VERTEBRAL BODY FLUOROSCOPIC GUIDED KYPHOPLASTY OF THE L4 VERTEBRAL BODY COMPARISON:  None Available. MEDICATIONS: As antibiotic prophylaxis, 2 g Ancef  was ordered pre-procedure and administered intravenously by the Radiology nurse within 1 hour of incision. 1000 mg Tylenol  was administered intravenously by the Radiology nurse following the procedure. ANESTHESIA/SEDATION: Moderate (conscious) sedation was employed during this procedure. A total of Versed  2 mg and Fentanyl  100 mcg was administered intravenously by the Radiology nurse. Moderate Sedation Time: 30 minutes. The patient's level of consciousness and vital signs were monitored continuously by radiology nursing throughout the procedure under my direct supervision. FLUOROSCOPY TIME:  Radiation exposure index: 78.4 mGy, air kerma COMPLICATIONS: None immediate. PROCEDURE: The procedure, risks (including but not limited to bleeding, infection, organ damage), benefits, and alternatives were explained to the patient. Questions regarding the procedure were encouraged and answered. The patient understands and consents to the procedure. The patient was placed prone on the fluoroscopic table. The skin overlying the lumbar region was then prepped and draped in the usual sterile fashion. Maximal barrier sterile technique was utilized including caps, mask, sterile gowns, sterile gloves, sterile drape, hand hygiene and skin antiseptic. Intravenous Fentanyl  and Versed  were administered as conscious sedation during continuous cardiorespiratory monitoring by the radiology RN. L1 The left pedicle at L1 was then infiltrated with 1% lidocaine  followed by the advancement of a Kyphon trocar needle through the left pedicle into the posterior one-third of the vertebral body. Subsequently, the osteo drill was advanced to the anterior third of the vertebral body.  The osteo drill was retracted. Through the working cannula, a Kyphon inflatable bone tamp 15 x 2.5 was advanced and positioned with the distal marker approximately 5 mm from the anterior aspect of the cortex. Appropriate positioning was confirmed on the AP projection. At this time, the balloon was expanded using contrast via a Kyphon inflation syringe device via micro tubing. Inflations were continued until there was near apposition with the superior end plate. At this time, methylmethacrylate mixture was reconstituted in the Kyphon bone mixing device system. This was then loaded into the delivery mechanism, attached to Kyphon bone fillers. The balloons were deflated and removed followed by the instillation of methylmethacrylate mixture with excellent filling in the AP and lateral projections. No extravasation was noted in the disk  spaces or posteriorly into the spinal canal. No epidural venous contamination was seen. The working cannulae and the bone filler were then retrieved and removed. Hemostasis was achieved with manual compression. The patient tolerated the procedure well without immediate postprocedural complication. L4 The right pedicle at L4 was then infiltrated with 1% lidocaine  followed by the advancement of a Kyphon trocar needle through the left pedicle into the posterior one-third of the vertebral body. Subsequently, the osteo drill was advanced to the anterior third of the vertebral body. The osteo drill was retracted. Through the working cannula, a Kyphon inflatable bone tamp 15 x 2.5 was advanced and positioned with the distal marker approximately 5 mm from the anterior aspect of the cortex. Appropriate positioning was confirmed on the AP projection. At this time, the balloon was expanded using contrast via a Kyphon inflation syringe device via micro tubing. Inflations were continued until there was near apposition with the superior end plate. At this time, methylmethacrylate mixture was  reconstituted in the Kyphon bone mixing device system. This was then loaded into the delivery mechanism, attached to Kyphon bone fillers. The balloons were deflated and removed followed by the instillation of methylmethacrylate mixture with excellent filling in the AP and lateral projections. No extravasation was noted in the disk spaces or posteriorly into the spinal canal. No epidural venous contamination was seen. The working cannulae and the bone filler were then retrieved and removed. Hemostasis was achieved with manual compression. The patient tolerated the procedure well without immediate postprocedural complication. IMPRESSION: 1. Technically successful L1 vertebral body augmentation using balloon kyphoplasty. 2. Technically successful L4 vertebral body augmentation using balloon kyphoplasty. 3. Per CMS PQRS reporting requirements (PQRS Measure 24): Given the patient's age of greater than 50 and the fracture site (hip, distal radius, or spine), the patient should be tested for osteoporosis using DXA, and the appropriate treatment considered based on the DXA results. Electronically Signed   By: Wilkie Lent M.D.   On: 12/29/2023 13:01    Labs:  CBC: Recent Labs    10/03/23 1247 10/04/23 0421 10/05/23 0813  WBC 16.4* 17.1* 14.4*  HGB 16.0 14.7 14.6  HCT 47.6 44.0 43.0  PLT 321 302 331    COAGS: Recent Labs    10/04/23 0421  INR 1.1    BMP: Recent Labs    10/03/23 1247 10/04/23 0421 10/05/23 0813  NA 135 140 137  K 4.3 3.9 3.9  CL 104 107 104  CO2 25 27 25   GLUCOSE 122* 101* 105*  BUN 21 15 16   CALCIUM 9.7 9.2 9.2  CREATININE 0.98 0.86 0.79  GFRNONAA >60 >60 >60    LIVER FUNCTION TESTS: Recent Labs    10/03/23 1247 10/05/23 0813  BILITOT 0.9 0.8  AST 21 17  ALT <5 <5  ALKPHOS 164* 129*  PROT 7.1 6.2*  ALBUMIN 4.2 3.5    TUMOR MARKERS: No results for input(s): AFPTM, CEA, CA199, CHROMGRNA in the last 8760 hours.  Assessment and Plan:  Doing  much better 1 month post cement augmentation at L1 and L4.  Continues to slowly improve with physical therapy.   1.) Refer to Dr. 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

## 2024-02-03 ENCOUNTER — Other Ambulatory Visit: Payer: Self-pay | Admitting: Interventional Radiology

## 2024-02-03 DIAGNOSIS — G8929 Other chronic pain: Secondary | ICD-10-CM

## 2024-02-23 ENCOUNTER — Other Ambulatory Visit: Payer: Self-pay | Admitting: Orthopedic Surgery

## 2024-02-23 DIAGNOSIS — G8929 Other chronic pain: Secondary | ICD-10-CM

## 2024-02-23 NOTE — Discharge Instructions (Signed)

## 2024-02-24 ENCOUNTER — Inpatient Hospital Stay
Admission: RE | Admit: 2024-02-24 | Discharge: 2024-02-24 | Disposition: A | Source: Ambulatory Visit | Attending: Interventional Radiology | Admitting: Interventional Radiology

## 2024-02-24 DIAGNOSIS — G8929 Other chronic pain: Secondary | ICD-10-CM

## 2024-02-24 MED ORDER — IOPAMIDOL (ISOVUE-M 200) INJECTION 41%
1.0000 mL | Freq: Once | INTRAMUSCULAR | Status: AC
  Administered 2024-02-24: 1 mL via EPIDURAL

## 2024-02-24 MED ORDER — METHYLPREDNISOLONE ACETATE 40 MG/ML INJ SUSP (RADIOLOG
80.0000 mg | Freq: Once | INTRAMUSCULAR | Status: AC
  Administered 2024-02-24: 80 mg via EPIDURAL
